# Patient Record
Sex: Male | Born: 1955 | Race: Asian | Hispanic: No | Marital: Married | Smoking: Former smoker
Health system: Southern US, Community
[De-identification: ages and names within clinical notes are randomized; demographics above are authoritative.]

## PROBLEM LIST (undated history)

## (undated) DIAGNOSIS — E785 Hyperlipidemia, unspecified: Secondary | ICD-10-CM

## (undated) DIAGNOSIS — D496 Neoplasm of unspecified behavior of brain: Secondary | ICD-10-CM

## (undated) DIAGNOSIS — R413 Other amnesia: Secondary | ICD-10-CM

## (undated) DIAGNOSIS — F29 Unspecified psychosis not due to a substance or known physiological condition: Secondary | ICD-10-CM

## (undated) DIAGNOSIS — IMO0002 Reserved for concepts with insufficient information to code with codable children: Secondary | ICD-10-CM

## (undated) DIAGNOSIS — I255 Ischemic cardiomyopathy: Secondary | ICD-10-CM

## (undated) DIAGNOSIS — R21 Rash and other nonspecific skin eruption: Secondary | ICD-10-CM

## (undated) DIAGNOSIS — C801 Malignant (primary) neoplasm, unspecified: Secondary | ICD-10-CM

## (undated) DIAGNOSIS — F17200 Nicotine dependence, unspecified, uncomplicated: Secondary | ICD-10-CM

## (undated) DIAGNOSIS — I2109 ST elevation (STEMI) myocardial infarction involving other coronary artery of anterior wall: Secondary | ICD-10-CM

## (undated) DIAGNOSIS — E559 Vitamin D deficiency, unspecified: Secondary | ICD-10-CM

## (undated) DIAGNOSIS — I2510 Atherosclerotic heart disease of native coronary artery without angina pectoris: Secondary | ICD-10-CM

## (undated) HISTORY — DX: Malignant (primary) neoplasm, unspecified: C80.1

## (undated) HISTORY — DX: Nicotine dependence, unspecified, uncomplicated: F17.200

## (undated) HISTORY — DX: Ischemic cardiomyopathy: I25.5

## (undated) HISTORY — DX: Reserved for concepts with insufficient information to code with codable children: IMO0002

## (undated) HISTORY — DX: Vitamin D deficiency, unspecified: E55.9

## (undated) HISTORY — DX: Rash and other nonspecific skin eruption: R21

## (undated) HISTORY — DX: Unspecified psychosis not due to a substance or known physiological condition: F29

## (undated) HISTORY — DX: Other amnesia: R41.3

## (undated) HISTORY — DX: ST elevation (STEMI) myocardial infarction involving other coronary artery of anterior wall: I21.09

## (undated) HISTORY — DX: Neoplasm of unspecified behavior of brain: D49.6

---

## 2007-10-02 DIAGNOSIS — C801 Malignant (primary) neoplasm, unspecified: Secondary | ICD-10-CM

## 2007-10-02 HISTORY — PX: CRANIECTOMY / CRANIOTOMY FOR EXCISION OF BRAIN TUMOR: SUR320

## 2007-10-02 HISTORY — DX: Malignant (primary) neoplasm, unspecified: C80.1

## 2008-04-26 ENCOUNTER — Encounter: Payer: Self-pay | Admitting: Internal Medicine

## 2008-06-01 ENCOUNTER — Ambulatory Visit: Payer: Self-pay | Admitting: Internal Medicine

## 2008-06-01 DIAGNOSIS — Z9181 History of falling: Secondary | ICD-10-CM | POA: Insufficient documentation

## 2008-06-01 DIAGNOSIS — R5383 Other fatigue: Secondary | ICD-10-CM

## 2008-06-01 DIAGNOSIS — R5381 Other malaise: Secondary | ICD-10-CM | POA: Insufficient documentation

## 2008-06-03 LAB — CONVERTED CEMR LAB
Albumin: 4.3 g/dL (ref 3.5–5.2)
Alkaline Phosphatase: 79 units/L (ref 39–117)
Basophils Absolute: 0 10*3/uL (ref 0.0–0.1)
HCT: 44.6 % (ref 39.0–52.0)
Hemoglobin, Urine: NEGATIVE
Hemoglobin: 15.6 g/dL (ref 13.0–17.0)
Ketones, ur: NEGATIVE mg/dL
MCV: 88.1 fL (ref 78.0–100.0)
Nitrite: NEGATIVE
RBC: 5.07 M/uL (ref 4.22–5.81)
Specific Gravity, Urine: <=1.005 (ref 1.000–1.03)
Total CK: 109 units/L (ref 7–195)
Total Protein, Urine: NEGATIVE mg/dL
Urine Glucose: NEGATIVE mg/dL
Urobilinogen, UA: 0.2 (ref 0.0–1.0)
WBC: 5.9 10*3/uL (ref 4.5–10.5)

## 2008-06-04 ENCOUNTER — Encounter: Admission: RE | Admit: 2008-06-04 | Discharge: 2008-06-04 | Payer: Self-pay | Admitting: Internal Medicine

## 2008-06-09 ENCOUNTER — Encounter: Payer: Self-pay | Admitting: Internal Medicine

## 2008-07-02 ENCOUNTER — Ambulatory Visit: Payer: Self-pay | Admitting: Internal Medicine

## 2008-07-02 DIAGNOSIS — IMO0002 Reserved for concepts with insufficient information to code with codable children: Secondary | ICD-10-CM

## 2008-07-02 DIAGNOSIS — E559 Vitamin D deficiency, unspecified: Secondary | ICD-10-CM

## 2008-07-02 HISTORY — DX: Reserved for concepts with insufficient information to code with codable children: IMO0002

## 2008-07-02 HISTORY — DX: Vitamin D deficiency, unspecified: E55.9

## 2008-07-05 ENCOUNTER — Ambulatory Visit: Payer: Self-pay | Admitting: Internal Medicine

## 2008-07-05 DIAGNOSIS — R413 Other amnesia: Secondary | ICD-10-CM

## 2008-07-05 DIAGNOSIS — F29 Unspecified psychosis not due to a substance or known physiological condition: Secondary | ICD-10-CM | POA: Insufficient documentation

## 2008-07-05 HISTORY — DX: Other amnesia: R41.3

## 2008-07-05 HISTORY — DX: Unspecified psychosis not due to a substance or known physiological condition: F29

## 2008-07-07 ENCOUNTER — Encounter: Payer: Self-pay | Admitting: Internal Medicine

## 2008-07-07 ENCOUNTER — Emergency Department (HOSPITAL_COMMUNITY): Admission: EM | Admit: 2008-07-07 | Discharge: 2008-07-07 | Payer: Self-pay | Admitting: Emergency Medicine

## 2008-07-08 ENCOUNTER — Telehealth: Payer: Self-pay | Admitting: Internal Medicine

## 2008-07-16 ENCOUNTER — Encounter: Payer: Self-pay | Admitting: Internal Medicine

## 2008-08-16 ENCOUNTER — Telehealth: Payer: Self-pay | Admitting: Internal Medicine

## 2008-08-18 ENCOUNTER — Ambulatory Visit: Payer: Self-pay | Admitting: Internal Medicine

## 2008-08-18 DIAGNOSIS — R509 Fever, unspecified: Secondary | ICD-10-CM | POA: Insufficient documentation

## 2008-08-18 DIAGNOSIS — R059 Cough, unspecified: Secondary | ICD-10-CM | POA: Insufficient documentation

## 2008-08-18 DIAGNOSIS — R21 Rash and other nonspecific skin eruption: Secondary | ICD-10-CM | POA: Insufficient documentation

## 2008-08-18 DIAGNOSIS — D496 Neoplasm of unspecified behavior of brain: Secondary | ICD-10-CM

## 2008-08-18 DIAGNOSIS — R05 Cough: Secondary | ICD-10-CM

## 2008-08-18 HISTORY — DX: Rash and other nonspecific skin eruption: R21

## 2008-08-18 HISTORY — DX: Neoplasm of unspecified behavior of brain: D49.6

## 2008-08-18 LAB — CONVERTED CEMR LAB
ALT: 82 units/L — ABNORMAL HIGH (ref 0–53)
BUN: 11 mg/dL (ref 6–23)
Bilirubin Urine: NEGATIVE
CO2: 27 meq/L (ref 19–32)
Chloride: 105 meq/L (ref 96–112)
Eosinophils Relative: 2.2 % (ref 0.0–5.0)
GFR calc non Af Amer: 127 mL/min
Hemoglobin, Urine: NEGATIVE
Leukocytes, UA: NEGATIVE
Lymphocytes Relative: 11.4 % — ABNORMAL LOW (ref 12.0–46.0)
MCHC: 34.7 g/dL (ref 30.0–36.0)
MCV: 88.9 fL (ref 78.0–100.0)
Monocytes Absolute: 0.7 10*3/uL (ref 0.1–1.0)
Neutro Abs: 11.6 10*3/uL — ABNORMAL HIGH (ref 1.4–7.7)
Neutrophils Relative %: 81.1 % — ABNORMAL HIGH (ref 43.0–77.0)
Potassium: 4.5 meq/L (ref 3.5–5.1)
RBC: 3.25 M/uL — ABNORMAL LOW (ref 4.22–5.81)
RDW: 13.3 % (ref 11.5–14.6)
Total Protein, Urine: NEGATIVE mg/dL
Urine Glucose: NEGATIVE mg/dL
WBC: 14.3 10*3/uL — ABNORMAL HIGH (ref 4.5–10.5)
pH: 5.5 (ref 5.0–8.0)

## 2008-08-20 ENCOUNTER — Telehealth: Payer: Self-pay | Admitting: Internal Medicine

## 2008-08-20 ENCOUNTER — Ambulatory Visit: Payer: Self-pay | Admitting: Internal Medicine

## 2008-08-25 ENCOUNTER — Ambulatory Visit: Payer: Self-pay | Admitting: Internal Medicine

## 2009-11-22 ENCOUNTER — Ambulatory Visit: Payer: Self-pay | Admitting: Internal Medicine

## 2009-11-22 LAB — CONVERTED CEMR LAB
Albumin: 4.3 g/dL (ref 3.5–5.2)
Bilirubin Urine: NEGATIVE
Bilirubin, Direct: 0.1 mg/dL (ref 0.0–0.3)
Cholesterol: 204 mg/dL — ABNORMAL HIGH (ref 0–200)
Creatinine, Ser: 1 mg/dL (ref 0.4–1.5)
Eosinophils Relative: 4.1 % (ref 0.0–5.0)
GFR calc non Af Amer: 83.63 mL/min (ref >60)
HCT: 43.2 % (ref 39.0–52.0)
Hemoglobin, Urine: NEGATIVE
Leukocytes, UA: NEGATIVE
MCHC: 33.7 g/dL (ref 30.0–36.0)
MCV: 88.4 fL (ref 78.0–100.0)
Monocytes Relative: 5.5 % (ref 3.0–12.0)
Neutro Abs: 3.7 10*3/uL (ref 1.4–7.7)
PSA: 0.85 ng/mL (ref 0.10–4.00)
Platelets: 259 10*3/uL (ref 150.0–400.0)
Potassium: 3.9 meq/L (ref 3.5–5.1)
RBC: 4.88 M/uL (ref 4.22–5.81)
RDW: 12.8 % (ref 11.5–14.6)
Specific Gravity, Urine: 1.015 (ref 1.000–1.030)
TSH: 0.79 microintl units/mL (ref 0.35–5.50)
Total CHOL/HDL Ratio: 4
Total Protein, Urine: NEGATIVE mg/dL
Total Protein: 6.8 g/dL (ref 6.0–8.3)
WBC: 6 10*3/uL (ref 4.5–10.5)

## 2009-11-28 ENCOUNTER — Ambulatory Visit: Payer: Self-pay | Admitting: Internal Medicine

## 2009-11-28 DIAGNOSIS — Z87891 Personal history of nicotine dependence: Secondary | ICD-10-CM | POA: Insufficient documentation

## 2009-11-28 DIAGNOSIS — F17200 Nicotine dependence, unspecified, uncomplicated: Secondary | ICD-10-CM

## 2009-11-28 HISTORY — DX: Nicotine dependence, unspecified, uncomplicated: F17.200

## 2010-10-23 ENCOUNTER — Encounter: Payer: Self-pay | Admitting: Internal Medicine

## 2010-10-31 NOTE — Assessment & Plan Note (Signed)
Summary: CPX /NWS  #   Vital Signs:  Patient profile:   55 year old male Height:      67 inches Weight:      187 pounds BMI:     29.39 Temp:     97.7 degrees F oral Pulse rate:   79 / minute BP sitting:   116 / 84  (left arm)  Vitals Entered By: Tora Perches (November 28, 2009 10:22 AM) CC: cpx Is Patient Diabetic? No   CC:  cpx.  History of Present Illness: The patient presents for a wellness examination   Current Medications (verified): 1)  Vitamin D3 1000 Unit  Tabs (Cholecalciferol) .... 2 By Mouth Daily 2)  Vitamin B Complex  Tabs (B Complex Vitamins) .Marland Kitchen.. 1 A Day 3)  Mupirocin 2 % Oint (Mupirocin) .... Use Qid 4)  Cefuroxime Axetil 500 Mg  Tabs (Cefuroxime Axetil) .Marland Kitchen.. 1 By Mouth 2 Times Daily 5)  Tamiflu 75 Mg Caps (Oseltamivir Phosphate) .... Take One Capsule By Mouth Twice A Day 6)  Promethazine Hcl 25 Mg Supp (Promethazine Hcl) .Marland Kitchen.. 1 Pr Q 4 H As Needed Nausea 7)  Kapidex 60 Mg Cpdr (Dexlansoprazole) .Marland Kitchen.. 1 By Mouth Bid 8)  Loratadine 10 Mg  Tabs (Loratadine) .... 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  The patient denies anorexia, fever, weight loss, vision loss, decreased hearing, hoarseness, chest pain, syncope, dyspnea on exertion, peripheral edema, prolonged cough, headaches, hemoptysis, abdominal pain, melena, hematochezia, severe indigestion/heartburn, hematuria, incontinence, genital sores, muscle weakness, suspicious skin lesions, transient blindness, difficulty walking, depression, unusual weight change, abnormal  bleeding, enlarged lymph nodes, angioedema, and testicular masses.         stiff neck  Physical Exam  General:  NAD Head:  Normocephalic and atraumatic without obvious abnormalities. No apparent alopecia or balding. Eyes:  No corneal or conjunctival inflammation noted. EOMI. Perrla. . Ears:  External ear exam shows no significant lesions or deformities.  Otoscopic examination reveals clear canals, tympanic membranes are intact bilaterally without bulging, retraction, inflammation or discharge. Hearing is grossly normal bilaterally. Nose:  External nasal examination shows no deformity or inflammation. Nasal mucosa are pink and moist without lesions or exudates. Mouth:  Oral mucosa and oropharynx without lesions or exudates.  Teeth in good repair. Neck:  decr ROM Chest Wall:  No deformities, masses, tenderness or gynecomastia noted. Breasts:  No masses or gynecomastia noted Lungs:  Normal respiratory effort, chest expands symmetrically. Lungs are clear to auscultation, no crackles or wheezes. Heart:  Normal rate and regular rhythm. S1 and S2 normal without gallop, murmur, click, rub or other extra sounds. Abdomen:  Bowel sounds positive,abdomen soft and non-tender without masses, organomegaly or hernias noted. Rectal:  No external abnormalities noted. Normal sphincter tone. No rectal masses or tenderness. Genitalia:  Testes bilaterally descended without nodularity, tenderness or masses. No scrotal masses or lesions. No penis lesions or urethral discharge. Prostate:  Prostate gland firm and smooth, no enlargement, nodularity, tenderness, mass, asymmetry or induration. Msk:  No deformity or scoliosis noted of thoracic or lumbar spine.  Lumbar-sacral spine is  with decr.  the ROM  Pulses:  R and L carotid,radial,femoral,dorsalis pedis and posterior tibial pulses are full and equal bilaterally Extremities:  No clubbing, cyanosis, edema, or deformity noted with normal full range of motion of all  joints.   Neurologic:  alert & oriented X3 and nl gait.   Skin:  No rash Cervical Nodes:  No lymphadenopathy noted Psych:  Oriented X3.     Impression & Recommendations:  Problem # 1:  PHYSICAL EXAMINATION (ICD-V70.0) Health and age related issues were discussed. Available screening tests and vaccinations were discussed as well. Healthy life style including good diet and execise was discussed. The labs were reviewed with the patient.  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 2 by mouth daily 2)  Vitamin B Complex Tabs (B complex vitamins) .Marland Kitchen.. 1 a day 3)  Loratadine 10 Mg Tabs (Loratadine) .... Once daily as needed allergies  Contraindications/Deferment of Procedures/Staging:    Treatment: Flu Shot    Contraindication: N/A     Test/Procedure: Colonoscopy    Reason for deferment: patient declined     Test/Procedure: DPT vaccine    Reason for deferment: declined   Patient Instructions: 1)  Please schedule a follow-up appointment in 1 year. 2)  Try to eat more raw plant food, fresh and dry fruit, raw almonds, leafy vegetables, whole foods and less red meat, less animal fat. Poultry and fish is better for you than pork and beef. Avoid processed foods (canned soups, hot dogs, sausage, bacon , frozen dinners). Avoid corn syrup, high fructose syrup or aspartam and Splenda  containing drinks. Honey, Agave and Stevia are better sweeteners. Make your own  dressing with olive oil, wine vinegar, lemon juce, garlic etc. for your salads. 3)  Use stretching exercises that I have provided (15 min. or longer every day)

## 2011-02-13 NOTE — Consult Note (Signed)
NAMESHENOUDA, GENOVA               ACCOUNT NO.:  [id number]   MEDICAL RECORD NO.:  [id number]          PATIENT TYPE:  EMS   LOCATION:  [facility]                         FACILITY:  [facility]   PHYSICIAN:  Cristi Loron, M.D.DATE OF BIRTH:  [birth year]   DATE OF CONSULTATION:  07/07/2008  DATE OF DISCHARGE:                                 CONSULTATION   CHIEF COMPLAINT:  Limp.   HISTORY OF PRESENT ILLNESS:  The patient is a 55 year old Asian male  immigrant from [place] who noted that he had been having a bit of a limp  in his right leg over about the last year.  He has seen his primary  doctor, Dr. Posey Rea who worked him up with lumbar MRI, treated him  with a prednisone taper.  The limp persisted and he was then sent for a  brain and cervical MRI today at [facility].  When the brain  MRI demonstrated a posterior fossa meningioma and some hydrocephalus, he  was instructed to go to the emergency department evidently by the  radiologist.  He was evaluated by Dr. Bruce Donath, and a neurosurgical  consultation was requested.   Presently, the patient denies headaches, nausea, vomiting, or seizures.  He says he has a bit of a limp in his right leg.  It seems to worsen in  the afternoon and improves by the morning.  He has not noticed that this  limp has changed over the last several months, i.e., his symptoms were  stable.   PAST MEDICAL HISTORY:  Negative.   PAST SURGICAL HISTORY:  None.   MEDICATIONS PRIOR TO ADMISSION:  None.   FAMILY MEDICAL HISTORY:  The patient's mother is aged 75 and good health  for age.  His father is aged 60 and has Alzheimer disease.   SOCIAL HISTORY:  The patient is an immigrant from [place].  He is  married.  He has 1 child.  He lives in [place].  He is an Art gallery manager  for [employer].  He smokes one pack per day of cigarettes.  Denies  ethanol or drug use.   REVIEW OF SYSTEMS:  Negative except as above.   PHYSICAL EXAMINATION:  GENERAL:  A  pleasant healthy-appearing 51-year-  old Asian male, in no apparent distress.  HEENT:  Normocephalic and atraumatic.  His pupils are equal, round, and  reactive to light.  Extraocular muscles intact.  Oropharynx benign.  Uvula midline.  NECK:  Supple.  There are no masses, deformities, or tracheal deviation.  He has a normal cervical range of motion.  Spurling testing is negative.  Lhermitte sign was not present.  THORAX:  Symmetric.  ABDOMEN:  Soft.  EXTREMITIES:  No obvious deformities.  BACK:  Normal.  NEUROLOGIC:  The patient is alert and oriented x3.  Cranial nerves II  through XII were examined bilaterally and grossly normal.  Vision and  hearing grossly normal bilaterally.  Motor strength is 5/5 in bilateral  deltoid, biceps, triceps, handgrip, psoas, quadriceps, gastrocnemius,  and extensor longus.  His deep tendon reflexes are 2/4 in his biceps,  triceps, and brachioradialis, 2+/5 in  bilateral quadriceps and  gastrocnemius.  He has 3B nonsustained ankle clonus bilaterally.  Sensory function is intact to light touch and sensation in all tested  dermatomes bilaterally.  Cerebellar function is intact to rapid  alternating movements.  There is minimal dysmetria to finger-to-nose.   IMAGING STUDIES:  I have reviewed the patient's cervical MRI performed  at [facility] today without contrast.  It demonstrates some  minimal degenerative changes, but no significant neural compression.   Also, the patient's brain MRI performed at [facility] today  with and without contrast.  It demonstrates he has a large posterior  fossa enhancing extra-axial lesion, which is based in the tentorium  consistent with a meningioma.  There is some mass effect upon the fourth  ventricle and brain stem area.  The patient does appear to have some  obstructive hydrocephalus.   ASSESSMENT AND PLAN:  Posterior fossa brain tumor and hydrocephalus.  I  discussed the situation with the  patient.  His symptoms are clinically  stable.  He is not having headaches, nausea, vomiting, seizures, or  worsening of his presenting symptoms.  I gave him my card and instructed  him to call my office today and scheduled appointment for me to see him  on Friday, i.e., July 09, 2008.  I have asked him to bring his wife  with him, so we can review the films, and I can discuss surgery with  him.  I have instructed him to come back to the ER should he develop  worsening symptoms such as mental status changes, nausea, vomiting, etc.  I do not think the patient needs to be on steroids as he does not have  significant edema.  I have answered all his questions.      Cristi Loron, M.D.  Electronically Signed     JDJ/MEDQ  D:  07/07/2008  T:  07/08/2008  Job:  [id number]   cc:   Georgina Quint. Plotnikov, MD

## 2011-07-03 LAB — PROTIME-INR
INR: 0.9
Prothrombin Time: 12.2

## 2011-07-03 LAB — CBC
Hemoglobin: 15.1
MCHC: 33.7
MCV: 89
RBC: 5.02
RDW: 13.9

## 2011-07-03 LAB — DIFFERENTIAL
Lymphocytes Relative: 21
Lymphs Abs: 1.4
Monocytes Relative: 4
Neutro Abs: 5.1
Neutrophils Relative %: 73

## 2011-07-03 LAB — COMPREHENSIVE METABOLIC PANEL
ALT: 30
CO2: 27
Calcium: 9.6
Creatinine, Ser: 0.95
GFR calc non Af Amer: >60
Glucose, Bld: 102 — ABNORMAL HIGH
Sodium: 139
Total Protein: 7.3

## 2011-07-03 LAB — APTT: aPTT: 30

## 2012-04-29 ENCOUNTER — Encounter: Payer: Self-pay | Admitting: Internal Medicine

## 2012-05-13 ENCOUNTER — Ambulatory Visit (INDEPENDENT_AMBULATORY_CARE_PROVIDER_SITE_OTHER)
Admission: RE | Admit: 2012-05-13 | Discharge: 2012-05-13 | Disposition: A | Discharge status: Home / Self Care | Payer: 59 | Source: Ambulatory Visit | Attending: Internal Medicine | Admitting: Internal Medicine

## 2012-05-13 ENCOUNTER — Ambulatory Visit (INDEPENDENT_AMBULATORY_CARE_PROVIDER_SITE_OTHER): Payer: 59 | Admitting: Internal Medicine

## 2012-05-13 ENCOUNTER — Encounter: Payer: Self-pay | Admitting: Internal Medicine

## 2012-05-13 VITALS — BP 118/72 | HR 81 | Temp 98.0°F | Ht 67.0 in | Wt 177.0 lb

## 2012-05-13 DIAGNOSIS — M25521 Pain in right elbow: Secondary | ICD-10-CM

## 2012-05-13 DIAGNOSIS — D496 Neoplasm of unspecified behavior of brain: Secondary | ICD-10-CM

## 2012-05-13 DIAGNOSIS — F17200 Nicotine dependence, unspecified, uncomplicated: Secondary | ICD-10-CM

## 2012-05-13 DIAGNOSIS — Z23 Encounter for immunization: Secondary | ICD-10-CM

## 2012-05-13 DIAGNOSIS — M25529 Pain in unspecified elbow: Secondary | ICD-10-CM

## 2012-05-13 DIAGNOSIS — E559 Vitamin D deficiency, unspecified: Secondary | ICD-10-CM

## 2012-05-13 DIAGNOSIS — Z0000 Encounter for general adult medical examination without abnormal findings: Secondary | ICD-10-CM | POA: Insufficient documentation

## 2012-05-13 DIAGNOSIS — R21 Rash and other nonspecific skin eruption: Secondary | ICD-10-CM | POA: Insufficient documentation

## 2012-05-13 MED ORDER — VITAMIN D 1000 UNITS PO TABS: 1000.0000 [IU] | ORAL_TABLET | Freq: Every day | ORAL | Status: AC

## 2012-05-13 MED ORDER — DOXYCYCLINE HYCLATE 100 MG PO TABS: 100.0000 mg | ORAL_TABLET | Freq: Two times a day (BID) | ORAL | Status: AC

## 2012-05-13 MED ORDER — METHYLPREDNISOLONE ACETATE 80 MG/ML IJ SUSP
120.0000 mg | Freq: Once | INTRAMUSCULAR | Status: AC
  Administered 2012-05-13: 120 mg via INTRAMUSCULAR

## 2012-05-13 NOTE — Progress Notes (Signed)
Subjective:    Patient ID: Blake [name], male    DOB: Jan 08, 1956, 56 y.o.   MRN: [id number]  HPI  The patient is here for a wellness exam. The patient has been doing well overall without major physical or psychological issues going on lately, except for -  C/o R elbow not flexing all the way  C/o skin rash x 1 mo -- itching  F/u on brain tumor    Wt Readings from Last 3 Encounters:  05/13/12 177 lb (80.287 kg)  11/28/09 187 lb (84.823 kg)  08/25/08 171 lb (77.565 kg)   BP Readings from Last 3 Encounters:  05/13/12 118/72  11/28/09 116/84  08/25/08 124/74      Review of Systems  Constitutional: Negative for appetite change, fatigue and unexpected weight change.  HENT: Negative for nosebleeds, congestion, sore throat, sneezing, trouble swallowing and neck pain.   Eyes: Negative for itching and visual disturbance.  Respiratory: Negative for cough.   Cardiovascular: Negative for chest pain, palpitations and leg swelling.  Gastrointestinal: Negative for nausea, diarrhea, blood in stool and abdominal distention.  Genitourinary: Negative for frequency and hematuria.  Musculoskeletal: Negative for back pain, joint swelling and gait problem.  Skin: Positive for rash.  Neurological: Negative for dizziness, tremors, speech difficulty and weakness.  Psychiatric/Behavioral: Negative for disturbed wake/sleep cycle, dysphoric mood and agitation. The patient is not nervous/anxious.        Objective:   Physical Exam  Constitutional: He is oriented to person, place, and time. He appears well-developed and well-nourished. No distress.  HENT:  Head: Normocephalic and atraumatic.  Right Ear: External ear normal.  Left Ear: External ear normal.  Nose: Nose normal.  Mouth/Throat: Oropharynx is clear and moist. No oropharyngeal exudate.  Eyes: Conjunctivae and EOM are normal. Pupils are equal, round, and reactive to light. Right eye exhibits no discharge. Left eye exhibits no discharge.  No scleral icterus.  Neck: Normal range of motion. Neck supple. No JVD present. No tracheal deviation present. No thyromegaly present.  Cardiovascular: Normal rate, regular rhythm, normal heart sounds and intact distal pulses.  Exam reveals no gallop and no friction rub.   No murmur heard. Pulmonary/Chest: Effort normal and breath sounds normal. No stridor. No respiratory distress. He has no wheezes. He has no rales. He exhibits no tenderness.  Abdominal: Soft. Bowel sounds are normal. He exhibits no distension and no mass. There is no tenderness. There is no rebound and no guarding.  Genitourinary: Rectum normal, prostate normal and penis normal. Guaiac negative stool. No penile tenderness.  Musculoskeletal: Normal range of motion. He exhibits no edema and no tenderness.  Lymphadenopathy:    He has no cervical adenopathy.  Neurological: He is alert and oriented to person, place, and time. He has normal reflexes. No cranial nerve deficit. He exhibits normal muscle tone. Coordination normal.  Skin: Skin is warm and dry. Rash noted. He is not diaphoretic. No erythema. No pallor.       Papular rash  Psychiatric: He has a normal mood and affect. His behavior is normal. Judgment and thought content normal.  R elbow is NT w/restricted flexion  Lab Results  Component Value Date   WBC 6.0 11/22/2009   HGB 14.5 11/22/2009   HCT 43.2 11/22/2009   PLT 259.0 11/22/2009   GLUCOSE 104* 11/22/2009   CHOL 204* 11/22/2009   TRIG 113.0 11/22/2009   HDL 46.00 11/22/2009   LDLDIRECT 153.1 11/22/2009   ALT 30 11/22/2009   AST 26 11/22/2009  NA 142 11/22/2009   K 3.9 11/22/2009   CL 108 11/22/2009   CREATININE 1.0 11/22/2009   BUN 11 11/22/2009   CO2 24 11/22/2009   TSH 0.79 11/22/2009   PSA 0.85 11/22/2009   INR 0.9 07/07/2008          Assessment & Plan:

## 2012-05-13 NOTE — Assessment & Plan Note (Signed)
Xray  May need NSAIDs

## 2012-05-13 NOTE — Assessment & Plan Note (Addendum)
We discussed age appropriate health related issues, including available/recomended screening tests and vaccinations. We discussed a need for adhering to healthy diet and exercise. Labs/EKG were reviewed/ordered. All questions were answered.  Declined colonoscopy Opth q 24 mo tDAP Zostavax adviced

## 2012-05-13 NOTE — Assessment & Plan Note (Signed)
New 05/2012 - strep(?) Doxy x 3 wks Depomedrol 120 mg IM

## 2012-05-13 NOTE — Assessment & Plan Note (Signed)
Restart vit D

## 2012-05-13 NOTE — Assessment & Plan Note (Signed)
2009 craniotomy, XRT

## 2012-05-13 NOTE — Assessment & Plan Note (Signed)
Discussed.

## 2012-05-14 ENCOUNTER — Telehealth: Payer: Self-pay | Admitting: Internal Medicine

## 2012-05-14 ENCOUNTER — Other Ambulatory Visit (INDEPENDENT_AMBULATORY_CARE_PROVIDER_SITE_OTHER): Payer: 59

## 2012-05-14 DIAGNOSIS — F17200 Nicotine dependence, unspecified, uncomplicated: Secondary | ICD-10-CM

## 2012-05-14 DIAGNOSIS — D496 Neoplasm of unspecified behavior of brain: Secondary | ICD-10-CM

## 2012-05-14 DIAGNOSIS — M25521 Pain in right elbow: Secondary | ICD-10-CM

## 2012-05-14 DIAGNOSIS — Z0000 Encounter for general adult medical examination without abnormal findings: Secondary | ICD-10-CM

## 2012-05-14 DIAGNOSIS — M25529 Pain in unspecified elbow: Secondary | ICD-10-CM

## 2012-05-14 DIAGNOSIS — R21 Rash and other nonspecific skin eruption: Secondary | ICD-10-CM

## 2012-05-14 LAB — URINALYSIS
Bilirubin Urine: NEGATIVE
Hgb urine dipstick: NEGATIVE
Ketones, ur: NEGATIVE
Leukocytes, UA: NEGATIVE
Specific Gravity, Urine: <=1.005 (ref 1.000–1.030)
Urobilinogen, UA: 0.2 (ref 0.0–1.0)

## 2012-05-14 LAB — BASIC METABOLIC PANEL
Calcium: 9.5 mg/dL (ref 8.4–10.5)
Creatinine, Ser: 0.8 mg/dL (ref 0.4–1.5)
GFR: 107.16 mL/min (ref >60.00)

## 2012-05-14 LAB — TSH: TSH: 0.81 u[IU]/mL (ref 0.35–5.50)

## 2012-05-14 LAB — CBC WITH DIFFERENTIAL/PLATELET
Basophils Relative: 0.3 % (ref 0.0–3.0)
Eosinophils Absolute: 0.3 10*3/uL (ref 0.0–0.7)
Eosinophils Relative: 3.9 % (ref 0.0–5.0)
Lymphocytes Relative: 23.9 % (ref 12.0–46.0)
Neutrophils Relative %: 66.4 % (ref 43.0–77.0)
RBC: 5.32 Mil/uL (ref 4.22–5.81)
WBC: 7.7 10*3/uL (ref 4.5–10.5)

## 2012-05-14 LAB — HEPATIC FUNCTION PANEL
ALT: 19 U/L (ref 0–53)
Total Bilirubin: 0.6 mg/dL (ref 0.3–1.2)

## 2012-05-14 LAB — LIPID PANEL
HDL: 48 mg/dL (ref >39.00)
Triglycerides: 81 mg/dL (ref 0.0–149.0)

## 2012-05-14 LAB — PSA: PSA: 0.75 ng/mL (ref 0.10–4.00)

## 2012-05-14 NOTE — Telephone Encounter (Signed)
Blake [name], please, inform patient that his elbow xray was ok Thx

## 2012-05-15 ENCOUNTER — Encounter: Payer: Self-pay | Admitting: Internal Medicine

## 2012-05-15 ENCOUNTER — Telehealth: Payer: Self-pay | Admitting: Internal Medicine

## 2012-05-15 NOTE — Telephone Encounter (Signed)
Called patient per MD/ LM with other

## 2012-05-15 NOTE — Telephone Encounter (Signed)
Blake [name], please, inform patient that all labs are normal except for elev chol Improve diet  Please, mail the labs to the patient.     Thx

## 2012-05-16 NOTE — Telephone Encounter (Signed)
Pt informed of lab results. 

## 2012-05-16 NOTE — Telephone Encounter (Signed)
Left message for pt to callback office (lab results also mailed to pt).

## 2012-06-09 ENCOUNTER — Telehealth: Payer: Self-pay | Admitting: Internal Medicine

## 2012-06-09 NOTE — Telephone Encounter (Signed)
Caller: [facility]/Patient; Patient Name: Blake [name]; PCP: Plotnikov, Alex (Adults only); Best Callback Phone Number: [phone] Seen in office on 05/13/12 and put on antibiotic for itchy pimple-like  bumps on arms, chest and buttocks - onset ~ 2 months ago. Some of the bumps have had a small amount of clear driainage. Starts out as bump, then becomes red and starts to itch. Afebrile. Triage and Care advice per Skin Lesions Protocol and appointment advised within 24 hours for "new signs and symptoms of local infection". Appointment scheduled at 0915 on 06/10/12 with Dr. Everardo All.

## 2012-06-09 NOTE — Telephone Encounter (Signed)
Spoke to pt- he states he is seeing Dr. Everardo All tomorrow.

## 2012-06-09 NOTE — Telephone Encounter (Signed)
The pt called and is stating he still has a skin rash.  He originally discussed this problem during his cpe on 8/13.  He is hoping to find out if he needs a referral to a dermatologist or if he needs to come in for a follow up office visit?   Thanks!

## 2012-06-10 ENCOUNTER — Ambulatory Visit (INDEPENDENT_AMBULATORY_CARE_PROVIDER_SITE_OTHER): Payer: 59 | Admitting: Endocrinology

## 2012-06-10 ENCOUNTER — Encounter: Payer: Self-pay | Admitting: Endocrinology

## 2012-06-10 VITALS — BP 116/82 | HR 73 | Temp 97.7°F

## 2012-06-10 DIAGNOSIS — R21 Rash and other nonspecific skin eruption: Secondary | ICD-10-CM

## 2012-06-10 MED ORDER — HALOBETASOL PROPIONATE 0.05 % EX CREA: TOPICAL_CREAM | Freq: Three times a day (TID) | CUTANEOUS | Status: AC

## 2012-06-10 NOTE — Progress Notes (Signed)
  Subjective:    Patient ID: Blake [name], male    DOB: [birth year], 56 y.o.   MRN: [id number]  HPI Pt states few mos of mild rash throughout the body, and intermittent assoc itching.  No help with abx. He is unable to cite precip factor.   Past Medical History  Diagnosis Date  . VITAMIN D DEFICIENCY 07/02/2008    Qualifier: Diagnosis of  By: Posey Rea MD, Georgina Quint TOBACCO USER 11/28/2009    Qualifier: Diagnosis of  By: Posey Rea MD, Georgina Quint Memory loss 07/05/2008    Qualifier: Diagnosis of  By: Posey Rea MD, Georgina Quint LUMBAR RADICULOPATHY, RIGHT 07/02/2008    Qualifier: Diagnosis of  By: Posey Rea MD, Georgina Quint MENTAL CONFUSION 07/05/2008    Qualifier: Diagnosis of  By: Posey Rea MD, Georgina Quint Neoplasm of unspecified nature of brain 08/18/2008    Qualifier: Diagnosis of  By: Posey Rea MD, Georgina Quint Rash and other nonspecific skin eruption 08/18/2008    Qualifier: Diagnosis of  By: Tora Perches    . Cancer 2009    brain tumor    Past Surgical History  Procedure Date  . Craniectomy / craniotomy for excision of brain tumor 2009    surgery and XRT at [facility]    History   Social History  . Marital Status: Married    Spouse Name: N/A    Number of Children: 1  . Years of Education: N/A   Occupational History  . 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 Smoking status: Current Every Day Smoker -- 1.0 packs/day    Types: Cigarettes  . Smokeless tobacco: Not on file  . Alcohol Use: No  . Drug Use: No  . Sexually Active: Yes   Other Topics Concern  . Not on file   Social History Narrative  . No narrative on file    Current Outpatient Prescriptions on File Prior to Visit  Medication Sig Dispense Refill  . cholecalciferol (VITAMIN D) 1000 UNITS tablet Take 1 tablet (1,000 Units total) by mouth daily.  100 tablet  3    No Known Allergies  Family History  Problem Relation Age of Onset  . Alzheimer's disease Father   .  Parkinsonism Father   . Hypertension Other   . Arthritis Mother     BP 116/82  Pulse 73  Temp 97.7 F (36.5 C) (Oral)  SpO2 96%  Review of Systems Denies fever    Objective:   Physical Exam VITAL SIGNS:  See vs page GENERAL: no distress Skin: mild diffuse multipapular rash       Assessment & Plan:

## 2012-06-10 NOTE — Patient Instructions (Addendum)
i have sent a prescription to your pharmacy, for a cream against the rash. I hope you feel better soon.  If you don't feel better by next week, please call back.

## 2012-08-04 ENCOUNTER — Ambulatory Visit (INDEPENDENT_AMBULATORY_CARE_PROVIDER_SITE_OTHER): Payer: 59 | Admitting: Internal Medicine

## 2012-08-04 ENCOUNTER — Encounter: Payer: Self-pay | Admitting: Internal Medicine

## 2012-08-04 VITALS — BP 112/84 | HR 84 | Temp 97.4°F | Resp 16 | Wt 173.0 lb

## 2012-08-04 DIAGNOSIS — R21 Rash and other nonspecific skin eruption: Secondary | ICD-10-CM

## 2012-08-04 MED ORDER — PERMETHRIN 5 % EX CREA: TOPICAL_CREAM | Freq: Once | CUTANEOUS | Status: DC

## 2012-08-04 NOTE — Assessment & Plan Note (Addendum)
New 05/2012 - strep(?) - no help w/abx 11/13 - his brother was dx'd w/scabies Empiric Permethrin 5% once Derm cons

## 2012-08-04 NOTE — Progress Notes (Signed)
Patient ID: Blake [name], male   DOB: Jun 21, 1956, 56 y.o.   MRN: [id number]  Subjective:    Patient ID: Blake [name], male    DOB: 06/28/1956, 56 y.o.   MRN: [id number]  Rash Pertinent negatives include no congestion, cough, diarrhea, fatigue or sore throat.      C/o skin rash x 5 mo -- itching  His brother was dx'd w/scabies    Wt Readings from Last 3 Encounters:  08/04/12 173 lb (78.472 kg)  05/13/12 177 lb (80.287 kg)  11/28/09 187 lb (84.823 kg)   BP Readings from Last 3 Encounters:  08/04/12 112/84  06/10/12 116/82  05/13/12 118/72      Review of Systems  Constitutional: Negative for appetite change, fatigue and unexpected weight change.  HENT: Negative for nosebleeds, congestion, sore throat, sneezing, trouble swallowing and neck pain.   Eyes: Negative for itching and visual disturbance.  Respiratory: Negative for cough.   Cardiovascular: Negative for chest pain, palpitations and leg swelling.  Gastrointestinal: Negative for nausea, diarrhea, blood in stool and abdominal distention.  Genitourinary: Negative for frequency and hematuria.  Musculoskeletal: Negative for back pain, joint swelling and gait problem.  Skin: Positive for rash.  Neurological: Negative for dizziness, tremors, speech difficulty and weakness.  Psychiatric/Behavioral: Negative for sleep disturbance, dysphoric mood and agitation. The patient is not nervous/anxious.        Objective:   Physical Exam  Constitutional: He is oriented to person, place, and time. He appears well-developed and well-nourished. No distress.  HENT:  Head: Normocephalic and atraumatic.  Right Ear: External ear normal.  Left Ear: External ear normal.  Nose: Nose normal.  Mouth/Throat: Oropharynx is clear and moist. No oropharyngeal exudate.  Eyes: Conjunctivae normal and EOM are normal. Pupils are equal, round, and reactive to light. Right eye exhibits no discharge. Left eye exhibits no discharge. No scleral icterus.    Neck: Normal range of motion. Neck supple. No JVD present. No tracheal deviation present. No thyromegaly present.  Cardiovascular: Normal rate, regular rhythm, normal heart sounds and intact distal pulses.  Exam reveals no gallop and no friction rub.   No murmur heard. Pulmonary/Chest: Effort normal and breath sounds normal. No stridor. No respiratory distress. He has no wheezes. He has no rales. He exhibits no tenderness.  Abdominal: Soft. Bowel sounds are normal. He exhibits no distension and no mass. There is no tenderness. There is no rebound and no guarding.  Genitourinary: Rectum normal, prostate normal and penis normal. Guaiac negative stool. No penile tenderness.  Musculoskeletal: Normal range of motion. He exhibits no edema and no tenderness.  Lymphadenopathy:    He has no cervical adenopathy.  Neurological: He is alert and oriented to person, place, and time. He has normal reflexes. No cranial nerve deficit. He exhibits normal muscle tone. Coordination normal.  Skin: Skin is warm and dry. Rash noted. He is not diaphoretic. No erythema. No pallor.       Papular rash  Psychiatric: He has a normal mood and affect. His behavior is normal. Judgment and thought content normal.  R elbow is NT w/restricted flexion  Lab Results  Component Value Date   WBC 7.7 05/14/2012   HGB 15.4 05/14/2012   HCT 46.5 05/14/2012   PLT 315.0 05/14/2012   GLUCOSE 85 05/14/2012   CHOL 225* 05/14/2012   TRIG 81.0 05/14/2012   HDL 48.00 05/14/2012   LDLDIRECT 161.0 05/14/2012   ALT 19 05/14/2012   AST 19 05/14/2012   NA 139 05/14/2012  K 5.4* 05/14/2012   CL 104 05/14/2012   CREATININE 0.8 05/14/2012   BUN 15 05/14/2012   CO2 27 05/14/2012   TSH 0.81 05/14/2012   PSA 0.75 05/14/2012   INR 0.9 07/07/2008          Assessment & Plan:

## 2012-08-11 ENCOUNTER — Other Ambulatory Visit: Payer: Self-pay | Admitting: *Deleted

## 2012-08-11 MED ORDER — PERMETHRIN 5 % EX CREA: TOPICAL_CREAM | Freq: Once | CUTANEOUS | Status: DC

## 2012-08-11 NOTE — Telephone Encounter (Signed)
Pt requesting refill of Acticin cream. Rx sent.

## 2012-10-23 ENCOUNTER — Telehealth: Payer: Self-pay | Admitting: *Deleted

## 2012-10-23 NOTE — Telephone Encounter (Signed)
I haven't seen it yet Thx 

## 2012-10-23 NOTE — Telephone Encounter (Signed)
Pt left vm stating Dr. Jorja Loa was sending a OV note re: his recent visit and to request that we draw some labs. Have you seen this note? If not, I will have to call Tafeen's office and get the order for labs so I can enter in Epic for our lab to draw.

## 2012-10-24 ENCOUNTER — Telehealth: Payer: Self-pay | Admitting: *Deleted

## 2012-10-24 ENCOUNTER — Other Ambulatory Visit: Payer: Self-pay | Admitting: *Deleted

## 2012-10-24 DIAGNOSIS — R21 Rash and other nonspecific skin eruption: Secondary | ICD-10-CM

## 2012-10-24 NOTE — Telephone Encounter (Signed)
Dr. Posey Rea- please order the CBC with smear and HTLV- 1 serology. See my pended orders in order entry. I wasn't sure about those. Thanks!

## 2012-10-24 NOTE — Telephone Encounter (Signed)
See phone noted dated 10/25/11.

## 2012-10-25 NOTE — Telephone Encounter (Signed)
Ok to go to lab any time this wk Thx

## 2012-10-27 ENCOUNTER — Telehealth: Payer: Self-pay | Admitting: *Deleted

## 2012-10-27 NOTE — Telephone Encounter (Signed)
See previous msg pt has been notified...Blake [name]

## 2012-10-27 NOTE — Telephone Encounter (Signed)
Left msg on triage call Friday abt having some labs done. Haven't received call bck. Called pt back inform him md ok labs can come in this week to have done...Raechel Chute

## 2012-10-28 ENCOUNTER — Other Ambulatory Visit: Payer: 59

## 2012-10-28 DIAGNOSIS — R21 Rash and other nonspecific skin eruption: Secondary | ICD-10-CM

## 2012-10-31 LAB — HTLV I+II ANTIBODIES, (EIA), BLD: HTLV I/II Antibody: NONREACTIVE

## 2012-10-31 LAB — LACTATE DEHYDROGENASE, ISOENZYMES
LD1/LD2 Ratio: 0.68
LDH 4: 9 % (ref 3–12)
LDH 5: 13 % (ref 3–14)
LDH Isoenzymes, Total: 158 U/L (ref 120–250)

## 2012-11-06 ENCOUNTER — Encounter: Payer: Self-pay | Admitting: Internal Medicine

## 2012-11-06 ENCOUNTER — Ambulatory Visit (INDEPENDENT_AMBULATORY_CARE_PROVIDER_SITE_OTHER): Payer: 59 | Admitting: Internal Medicine

## 2012-11-06 VITALS — BP 112/80 | HR 84 | Temp 97.7°F | Resp 16 | Wt 178.0 lb

## 2012-11-06 DIAGNOSIS — F17200 Nicotine dependence, unspecified, uncomplicated: Secondary | ICD-10-CM

## 2012-11-06 DIAGNOSIS — R21 Rash and other nonspecific skin eruption: Secondary | ICD-10-CM

## 2012-11-06 DIAGNOSIS — E559 Vitamin D deficiency, unspecified: Secondary | ICD-10-CM

## 2012-11-06 NOTE — Progress Notes (Signed)
Subjective:    Rash This is a chronic problem. The current episode started more than 1 month ago (9-11 mo). Pertinent negatives include no congestion, cough, diarrhea, fatigue or sore throat.      C/o skin rash x 11 mo -- better  F/u Vit D def and tobacco smoking    Wt Readings from Last 3 Encounters:  11/06/12 178 lb (80.74 kg)  08/04/12 173 lb (78.472 kg)  05/13/12 177 lb (80.287 kg)   BP Readings from Last 3 Encounters:  11/06/12 112/80  08/04/12 112/84  06/10/12 116/82      Review of Systems  Constitutional: Negative for appetite change, fatigue and unexpected weight change.  HENT: Negative for nosebleeds, congestion, sore throat, sneezing, trouble swallowing and neck pain.   Eyes: Negative for itching and visual disturbance.  Respiratory: Negative for cough.   Cardiovascular: Negative for chest pain, palpitations and leg swelling.  Gastrointestinal: Negative for nausea, diarrhea, blood in stool and abdominal distention.  Genitourinary: Negative for frequency and hematuria.  Musculoskeletal: Negative for back pain, joint swelling and gait problem.  Skin: Positive for rash.  Neurological: Negative for dizziness, tremors, speech difficulty and weakness.  Psychiatric/Behavioral: Negative for sleep disturbance, dysphoric mood and agitation. The patient is not nervous/anxious.        Objective:   Physical Exam  Constitutional: He is oriented to person, place, and time. He appears well-developed and well-nourished. No distress.  HENT:  Head: Normocephalic and atraumatic.  Right Ear: External ear normal.  Left Ear: External ear normal.  Nose: Nose normal.  Mouth/Throat: Oropharynx is clear and moist. No oropharyngeal exudate.  Eyes: Conjunctivae normal and EOM are normal. Pupils are equal, round, and reactive to light. Right eye exhibits no discharge. Left eye exhibits no discharge. No scleral icterus.  Neck: Normal range of motion. Neck supple. No JVD present.  No tracheal deviation present. No thyromegaly present.  Cardiovascular: Normal rate, regular rhythm, normal heart sounds and intact distal pulses.  Exam reveals no gallop and no friction rub.   No murmur heard. Pulmonary/Chest: Effort normal and breath sounds normal. No stridor. No respiratory distress. He has no wheezes. He has no rales. He exhibits no tenderness.  Abdominal: Soft. Bowel sounds are normal. He exhibits no distension and no mass. There is no tenderness. There is no rebound and no guarding.  Genitourinary: Rectum normal, prostate normal and penis normal. Guaiac negative stool. No penile tenderness.  Musculoskeletal: Normal range of motion. He exhibits no edema and no tenderness.  Lymphadenopathy:    He has no cervical adenopathy.  Neurological: He is alert and oriented to person, place, and time. He has normal reflexes. No cranial nerve deficit. He exhibits normal muscle tone. Coordination normal.  Skin: Skin is warm and dry. Rash noted. He is not diaphoretic. No erythema. No pallor.       Papular rash  Psychiatric: He has a normal mood and affect. His behavior is normal. Judgment and thought content normal.  R elbow is NT w/restricted flexion  Lab Results  Component Value Date   WBC 7.7 05/14/2012   HGB 15.4 05/14/2012   HCT 46.5 05/14/2012   PLT 315.0 05/14/2012   GLUCOSE 85 05/14/2012   CHOL 225* 05/14/2012   TRIG 81.0 05/14/2012   HDL 48.00 05/14/2012   LDLDIRECT 161.0 05/14/2012   ALT 19 05/14/2012   AST 19 05/14/2012   NA 139 05/14/2012   K 5.4* 05/14/2012   CL 104 05/14/2012   CREATININE 0.8 05/14/2012  BUN 15 05/14/2012   CO2 27 05/14/2012   TSH 0.81 05/14/2012   PSA 0.75 05/14/2012   INR 0.9 07/07/2008          Assessment & Plan:

## 2012-11-06 NOTE — Assessment & Plan Note (Signed)
Discussed.

## 2012-11-06 NOTE — Assessment & Plan Note (Signed)
Re-start Vit D 

## 2012-11-06 NOTE — Assessment & Plan Note (Signed)
2/14 r/o pre-Tcell lymphoma Dr Jorja Loa L[facility], HTLV - in 6 mo

## 2012-11-15 ENCOUNTER — Other Ambulatory Visit: Payer: Self-pay

## 2013-08-06 ENCOUNTER — Other Ambulatory Visit: Payer: Self-pay

## 2014-02-09 ENCOUNTER — Ambulatory Visit (INDEPENDENT_AMBULATORY_CARE_PROVIDER_SITE_OTHER): Payer: 59 | Admitting: Internal Medicine

## 2014-02-09 ENCOUNTER — Encounter: Payer: Self-pay | Admitting: Internal Medicine

## 2014-02-09 VITALS — BP 120/80 | Temp 98.9°F | Ht 67.0 in | Wt 184.0 lb

## 2014-02-09 DIAGNOSIS — D496 Neoplasm of unspecified behavior of brain: Secondary | ICD-10-CM

## 2014-02-09 DIAGNOSIS — Z0000 Encounter for general adult medical examination without abnormal findings: Secondary | ICD-10-CM

## 2014-02-09 DIAGNOSIS — F17200 Nicotine dependence, unspecified, uncomplicated: Secondary | ICD-10-CM

## 2014-02-09 DIAGNOSIS — M25549 Pain in joints of unspecified hand: Secondary | ICD-10-CM

## 2014-02-09 DIAGNOSIS — E559 Vitamin D deficiency, unspecified: Secondary | ICD-10-CM

## 2014-02-09 DIAGNOSIS — D320 Benign neoplasm of cerebral meninges: Secondary | ICD-10-CM | POA: Insufficient documentation

## 2014-02-09 MED ORDER — MELOXICAM 7.5 MG PO TABS: 7.5000 mg | ORAL_TABLET | Freq: Every day | ORAL | Status: DC | PRN

## 2014-02-09 NOTE — Assessment & Plan Note (Addendum)
  We discussed age appropriate health related issues, including available/recomended screening tests and vaccinations. We discussed a need for adhering to healthy diet and exercise. Labs/EKG were reviewed/ordered. All questions were answered. Declined colonoscopy Opth q 24 mo Zostavax advised Cologuard (colonoscopy declined)

## 2014-02-09 NOTE — Assessment & Plan Note (Signed)
Re-start Vit D 

## 2014-02-09 NOTE — Progress Notes (Signed)
Subjective:     HPI  The patient is here for a wellness exam. The patient has been doing well overall without major physical or psychological issues going on lately, except for -  C/o hand pain and stiffness  F/u on brain tumor    Wt Readings from Last 3 Encounters:  02/09/14 184 lb (83.462 kg)  11/06/12 178 lb (80.74 kg)  08/04/12 173 lb (78.472 kg)   BP Readings from Last 3 Encounters:  02/09/14 120/80  11/06/12 112/80  08/04/12 112/84      Review of Systems  Constitutional: Negative for appetite change, fatigue and unexpected weight change.  HENT: Negative for congestion, nosebleeds, sneezing, sore throat and trouble swallowing.   Eyes: Negative for itching and visual disturbance.  Respiratory: Negative for cough.   Cardiovascular: Negative for chest pain, palpitations and leg swelling.  Gastrointestinal: Negative for nausea, diarrhea, blood in stool and abdominal distention.  Genitourinary: Negative for frequency and hematuria.  Musculoskeletal: Negative for back pain, gait problem, joint swelling and neck pain.  Skin: Positive for rash.  Neurological: Negative for dizziness, tremors, speech difficulty and weakness.  Psychiatric/Behavioral: Negative for sleep disturbance, dysphoric mood and agitation. The patient is not nervous/anxious.        Objective:   Physical Exam  Constitutional: He is oriented to person, place, and time. He appears well-developed and well-nourished. No distress.  HENT:  Head: Normocephalic and atraumatic.  Right Ear: External ear normal.  Left Ear: External ear normal.  Nose: Nose normal.  Mouth/Throat: Oropharynx is clear and moist. No oropharyngeal exudate.  Eyes: Conjunctivae and EOM are normal. Pupils are equal, round, and reactive to light. Right eye exhibits no discharge. Left eye exhibits no discharge. No scleral icterus.  Neck: Normal range of motion. Neck supple. No JVD present. No tracheal deviation present. No thyromegaly  present.  Cardiovascular: Normal rate, regular rhythm, normal heart sounds and intact distal pulses.  Exam reveals no gallop and no friction rub.   No murmur heard. Pulmonary/Chest: Effort normal and breath sounds normal. No stridor. No respiratory distress. He has no wheezes. He has no rales. He exhibits no tenderness.  Abdominal: Soft. Bowel sounds are normal. He exhibits no distension and no mass. There is no tenderness. There is no rebound and no guarding.  Genitourinary: Rectum normal, prostate normal and penis normal. Guaiac negative stool. No penile tenderness.  Musculoskeletal: Normal range of motion. He exhibits no edema and no tenderness.  Lymphadenopathy:    He has no cervical adenopathy.  Neurological: He is alert and oriented to person, place, and time. He has normal reflexes. No cranial nerve deficit. He exhibits normal muscle tone. Coordination normal.  Skin: Skin is warm and dry. Rash noted. He is not diaphoretic. No erythema. No pallor.  Papular rash  Psychiatric: He has a normal mood and affect. His behavior is normal. Judgment and thought content normal.  R elbow is NT w/restricted flexion  Lab Results  Component Value Date   WBC 7.7 05/14/2012   HGB 15.4 05/14/2012   HCT 46.5 05/14/2012   PLT 315.0 05/14/2012   GLUCOSE 85 05/14/2012   CHOL 225* 05/14/2012   TRIG 81.0 05/14/2012   HDL 48.00 05/14/2012   LDLDIRECT 161.0 05/14/2012   ALT 19 05/14/2012   AST 19 05/14/2012   NA 139 05/14/2012   K 5.4* 05/14/2012   CL 104 05/14/2012   CREATININE 0.8 05/14/2012   BUN 15 05/14/2012   CO2 27 05/14/2012   TSH 0.81 05/14/2012  PSA 0.75 05/14/2012   INR 0.9 07/07/2008          Assessment & Plan:

## 2014-02-09 NOTE — Assessment & Plan Note (Signed)
Labs Meloxicam prn

## 2014-02-09 NOTE — Assessment & Plan Note (Signed)
Discussed.

## 2014-02-09 NOTE — Assessment & Plan Note (Signed)
F/u at [facility] 

## 2014-02-09 NOTE — Progress Notes (Deleted)
Pre visit review using our clinic review tool, if applicable. No additional management support is needed unless otherwise documented below in the visit note. 

## 2014-02-10 ENCOUNTER — Other Ambulatory Visit (INDEPENDENT_AMBULATORY_CARE_PROVIDER_SITE_OTHER): Payer: 59

## 2014-02-10 DIAGNOSIS — E559 Vitamin D deficiency, unspecified: Secondary | ICD-10-CM

## 2014-02-10 DIAGNOSIS — M25549 Pain in joints of unspecified hand: Secondary | ICD-10-CM

## 2014-02-10 DIAGNOSIS — Z0000 Encounter for general adult medical examination without abnormal findings: Secondary | ICD-10-CM

## 2014-02-10 LAB — LIPID PANEL
CHOLESTEROL: 199 mg/dL (ref 0–200)
HDL: 36.7 mg/dL — AB (ref >39.00)
LDL CALC: 139 mg/dL — AB (ref 0–99)
TRIGLYCERIDES: 117 mg/dL (ref 0.0–149.0)
Total CHOL/HDL Ratio: 5
VLDL: 23.4 mg/dL (ref 0.0–40.0)

## 2014-02-10 LAB — CBC WITH DIFFERENTIAL/PLATELET
Basophils Absolute: 0 10*3/uL (ref 0.0–0.1)
Basophils Relative: 0.3 % (ref 0.0–3.0)
EOS PCT: 2.6 % (ref 0.0–5.0)
Eosinophils Absolute: 0.2 10*3/uL (ref 0.0–0.7)
HCT: 45.1 % (ref 39.0–52.0)
Hemoglobin: 15.4 g/dL (ref 13.0–17.0)
LYMPHS PCT: 28 % (ref 12.0–46.0)
Lymphs Abs: 1.9 10*3/uL (ref 0.7–4.0)
MCHC: 34.1 g/dL (ref 30.0–36.0)
MCV: 85.6 fl (ref 78.0–100.0)
MONO ABS: 0.3 10*3/uL (ref 0.1–1.0)
Monocytes Relative: 4.9 % (ref 3.0–12.0)
NEUTROS PCT: 64.2 % (ref 43.0–77.0)
Neutro Abs: 4.4 10*3/uL (ref 1.4–7.7)
PLATELETS: 258 10*3/uL (ref 150.0–400.0)
RBC: 5.26 Mil/uL (ref 4.22–5.81)
RDW: 13.7 % (ref 11.5–15.5)
WBC: 6.8 10*3/uL (ref 4.0–10.5)

## 2014-02-10 LAB — VITAMIN B12: VITAMIN B 12: 297 pg/mL (ref 211–911)

## 2014-02-10 LAB — BASIC METABOLIC PANEL
BUN: 14 mg/dL (ref 6–23)
CALCIUM: 9.5 mg/dL (ref 8.4–10.5)
CHLORIDE: 109 meq/L (ref 96–112)
CO2: 24 mEq/L (ref 19–32)
CREATININE: 1 mg/dL (ref 0.4–1.5)
GFR: 84.24 mL/min (ref >60.00)
Glucose, Bld: 94 mg/dL (ref 70–99)
Potassium: 4.3 mEq/L (ref 3.5–5.1)
Sodium: 142 mEq/L (ref 135–145)

## 2014-02-10 LAB — PSA: PSA: 1.03 ng/mL (ref 0.10–4.00)

## 2014-02-10 LAB — TSH: TSH: 0.69 u[IU]/mL (ref 0.35–4.50)

## 2014-02-10 LAB — HEPATIC FUNCTION PANEL
ALT: 20 U/L (ref 0–53)
AST: 24 U/L (ref 0–37)
Albumin: 4.1 g/dL (ref 3.5–5.2)
Alkaline Phosphatase: 83 U/L (ref 39–117)
BILIRUBIN DIRECT: 0.1 mg/dL (ref 0.0–0.3)
BILIRUBIN TOTAL: 0.8 mg/dL (ref 0.2–1.2)
Total Protein: 6.9 g/dL (ref 6.0–8.3)

## 2014-02-10 LAB — RHEUMATOID FACTOR

## 2014-02-10 LAB — SEDIMENTATION RATE: SED RATE: 11 mm/h (ref 0–22)

## 2014-02-11 LAB — VITAMIN D 25 HYDROXY (VIT D DEFICIENCY, FRACTURES): VIT D 25 HYDROXY: 20 ng/mL — AB (ref 30–89)

## 2014-02-12 ENCOUNTER — Other Ambulatory Visit: Payer: Self-pay | Admitting: Internal Medicine

## 2014-02-12 MED ORDER — ERGOCALCIFEROL 1.25 MG (50000 UT) PO CAPS: 50000.0000 [IU] | ORAL_CAPSULE | ORAL | Status: DC

## 2014-02-12 MED ORDER — VITAMIN B-12 1000 MCG SL SUBL: 1.0000 | SUBLINGUAL_TABLET | Freq: Every day | SUBLINGUAL | Status: DC

## 2014-03-24 ENCOUNTER — Other Ambulatory Visit: Payer: Self-pay | Admitting: Internal Medicine

## 2014-05-11 ENCOUNTER — Other Ambulatory Visit: Payer: Self-pay | Admitting: Internal Medicine

## 2014-06-16 ENCOUNTER — Other Ambulatory Visit: Payer: Self-pay | Admitting: Internal Medicine

## 2014-11-26 ENCOUNTER — Other Ambulatory Visit: Payer: Self-pay | Admitting: Internal Medicine

## 2015-01-05 ENCOUNTER — Other Ambulatory Visit: Payer: Self-pay | Admitting: Internal Medicine

## 2015-12-12 ENCOUNTER — Ambulatory Visit (INDEPENDENT_AMBULATORY_CARE_PROVIDER_SITE_OTHER)
Admission: RE | Admit: 2015-12-12 | Discharge: 2015-12-12 | Disposition: A | Discharge status: Home / Self Care | Payer: 59 | Source: Ambulatory Visit | Attending: Internal Medicine | Admitting: Internal Medicine

## 2015-12-12 ENCOUNTER — Encounter: Payer: Self-pay | Admitting: Internal Medicine

## 2015-12-12 ENCOUNTER — Ambulatory Visit (INDEPENDENT_AMBULATORY_CARE_PROVIDER_SITE_OTHER): Payer: 59 | Admitting: Internal Medicine

## 2015-12-12 VITALS — BP 130/84 | HR 82 | Ht 67.0 in | Wt 168.0 lb

## 2015-12-12 DIAGNOSIS — F17200 Nicotine dependence, unspecified, uncomplicated: Secondary | ICD-10-CM

## 2015-12-12 DIAGNOSIS — Z0000 Encounter for general adult medical examination without abnormal findings: Secondary | ICD-10-CM

## 2015-12-12 DIAGNOSIS — D496 Neoplasm of unspecified behavior of brain: Secondary | ICD-10-CM

## 2015-12-12 DIAGNOSIS — E041 Nontoxic single thyroid nodule: Secondary | ICD-10-CM | POA: Diagnosis not present

## 2015-12-12 DIAGNOSIS — E559 Vitamin D deficiency, unspecified: Secondary | ICD-10-CM

## 2015-12-12 MED ORDER — MELOXICAM 7.5 MG PO TABS: 7.5000 mg | ORAL_TABLET | Freq: Every day | ORAL | Status: DC

## 2015-12-12 NOTE — Assessment & Plan Note (Signed)
Labs

## 2015-12-12 NOTE — Assessment & Plan Note (Signed)
Discussed CXR.  

## 2015-12-12 NOTE — Assessment & Plan Note (Addendum)
MRI 02/04/15 Brain MRI w/L thyroid lobe 2.4 cm nodule -  02/04/15 Will sch an [place]

## 2015-12-12 NOTE — Assessment & Plan Note (Addendum)
We discussed age appropriate health related issues, including available/recomended screening tests and vaccinations. We discussed a need for adhering to healthy diet and exercise. Labs/EKG were reviewed/ordered. All questions were answered. Declined colonoscopy Opth q 24 mo Zostavax advised Cologuard (colonoscopy declined) Declined a pneumovax CXR EKG

## 2015-12-12 NOTE — Progress Notes (Signed)
Pre visit review using our clinic review tool, if applicable. No additional management support is needed unless otherwise documented below in the visit note. 

## 2015-12-12 NOTE — Progress Notes (Signed)
Subjective:  Patient ID: Blake [name], male    DOB: 1955/12/06  Age: 60 y.o. MRN: [id number]  CC: Annual Exam   HPI Blake [name] presents for a well exam  Outpatient Prescriptions Prior to Visit  Medication Sig Dispense Refill  . Cholecalciferol 1000 UNITS tablet Take 1,000 Units by mouth daily.    . Cyanocobalamin (VITAMIN B-12) 1000 MCG SUBL Place 1 tablet (1,000 mcg total) under the tongue daily. 100 tablet 3  . meloxicam (MOBIC) 7.5 MG tablet TAKE 1 TO 2 TABLETS BY MOUTH EVERY DAY AS NEEDED FOR PAIN 60 tablet 3  . Vitamin D, Ergocalciferol, (DRISDOL) 50000 UNITS CAPS capsule TAKE 1 CAPSULE (50,000 UNITS TOTAL) BY MOUTH ONCE A WEEK. 6 capsule 5   No facility-administered medications prior to visit.    ROS Review of Systems  Constitutional: Negative for fever, appetite change, fatigue and unexpected weight change.  HENT: Negative for congestion, nosebleeds, sneezing, sore throat and trouble swallowing.   Eyes: Negative for itching and visual disturbance.  Respiratory: Negative for cough.   Cardiovascular: Negative for chest pain, palpitations and leg swelling.  Gastrointestinal: Negative for nausea, vomiting, diarrhea, blood in stool and abdominal distention.  Genitourinary: Negative for frequency and hematuria.  Musculoskeletal: Negative for back pain, joint swelling, gait problem and neck pain.  Skin: Negative for rash.  Neurological: Negative for dizziness, tremors, speech difficulty, weakness, light-headedness and headaches.  Psychiatric/Behavioral: Negative for suicidal ideas, sleep disturbance, dysphoric mood and agitation. The patient is not nervous/anxious.     Objective:  BP 130/84 mmHg  Pulse 82  Ht 5\' 7"  (1.702 m)  Wt 168 lb (76.204 kg)  BMI 26.31 kg/m2  SpO2 95%  BP Readings from Last 3 Encounters:  12/12/15 130/84  02/09/14 120/80  11/06/12 112/80    Wt Readings from Last 3 Encounters:  12/12/15 168 lb (76.204 kg)  02/09/14 184 lb (83.462 kg)    11/06/12 178 lb (80.74 kg)    Physical Exam  Constitutional: He is oriented to person, place, and time. He appears well-developed and well-nourished. No distress.  HENT:  Head: Normocephalic and atraumatic.  Right Ear: External ear normal.  Left Ear: External ear normal.  Nose: Nose normal.  Mouth/Throat: Oropharynx is clear and moist. No oropharyngeal exudate.  Eyes: Conjunctivae and EOM are normal. Pupils are equal, round, and reactive to light. Right eye exhibits no discharge. Left eye exhibits no discharge. No scleral icterus.  Neck: Normal range of motion. Neck supple. No JVD present. No tracheal deviation present. No thyromegaly present.  No palpable nodules  Cardiovascular: Normal rate, regular rhythm, normal heart sounds and intact distal pulses.  Exam reveals no gallop and no friction rub.   No murmur heard. Pulmonary/Chest: Effort normal and breath sounds normal. No stridor. No respiratory distress. He has no wheezes. He has no rales. He exhibits no tenderness.  Abdominal: Soft. Bowel sounds are normal. He exhibits no distension and no mass. There is no tenderness. There is no rebound and no guarding.  Genitourinary: Rectum normal and penis normal. Guaiac negative stool. No penile tenderness.  Prostate 1+  Musculoskeletal: Normal range of motion. He exhibits no edema or tenderness.  Lymphadenopathy:    He has no cervical adenopathy.  Neurological: He is alert and oriented to person, place, and time. He has normal reflexes. No cranial nerve deficit. He exhibits normal muscle tone. Coordination normal.  Skin: Skin is warm and dry. No rash noted. He is not diaphoretic. No erythema. No pallor.  Psychiatric: He  has a normal mood and affect. His behavior is normal. Judgment and thought content normal.    Lab Results  Component Value Date   WBC 6.8 02/10/2014   HGB 15.4 02/10/2014   HCT 45.1 02/10/2014   PLT 258.0 02/10/2014   GLUCOSE 94 02/10/2014   CHOL 199 02/10/2014    TRIG 117.0 02/10/2014   HDL 36.70* 02/10/2014   LDLDIRECT 161.0 05/14/2012   LDLCALC 139* 02/10/2014   ALT 20 02/10/2014   AST 24 02/10/2014   NA 142 02/10/2014   K 4.3 02/10/2014   CL 109 02/10/2014   CREATININE 1.0 02/10/2014   BUN 14 02/10/2014   CO2 24 02/10/2014   TSH 0.69 02/10/2014   PSA 1.03 02/10/2014   INR 0.9 07/07/2008   EKG -IRBBB  Dg Elbow Complete Right  05/13/2012  *RADIOLOGY REPORT* Clinical Data: Right elbow pain, stiffness RIGHT ELBOW - COMPLETE 3+ VIEW Comparison: None. Findings: Four views of the right elbow submitted.  No acute fracture or subluxation.  No posterior fat pad sign.  Minimal spurring anterior aspect of the distal humerus. IMPRESSION: No acute fracture or subluxation. Original Report Authenticated By: Lahoma Crocker, M.D.   Assessment & Plan:   Blake was seen today for annual exam.  Diagnoses and all orders for this visit:  Well adult exam -     meloxicam (MOBIC) 7.5 MG tablet; Take 1-2 tablets (7.5-15 mg total) by mouth daily. -     EKG 12-Lead -     DG Chest 2 View  Brain tumor [facility]) -     DG Chest 2 View  Thyroid nodule -     US Soft Tissue Head/Neck -     DG Chest 2 View  Vitamin D deficiency  TOBACCO USER   I have discontinued Blake [name] Vitamin D (Ergocalciferol). I have also changed his meloxicam. Additionally, I am having him maintain his Cholecalciferol and Vitamin B-12.  Meds ordered this encounter  Medications  . meloxicam (MOBIC) 7.5 MG tablet    Sig: Take 1-2 tablets (7.5-15 mg total) by mouth daily.    Dispense:  60 tablet    Refill:  5     Follow-up: Return in about 1 year (around 12/11/2016) for Wellness Exam.  Walker Kehr, MD

## 2015-12-13 ENCOUNTER — Other Ambulatory Visit (INDEPENDENT_AMBULATORY_CARE_PROVIDER_SITE_OTHER): Payer: 59

## 2015-12-13 DIAGNOSIS — E559 Vitamin D deficiency, unspecified: Secondary | ICD-10-CM

## 2015-12-13 DIAGNOSIS — Z0000 Encounter for general adult medical examination without abnormal findings: Secondary | ICD-10-CM | POA: Diagnosis not present

## 2015-12-13 LAB — URINALYSIS
Bilirubin Urine: NEGATIVE
Hgb urine dipstick: NEGATIVE
KETONES UR: NEGATIVE
Leukocytes, UA: NEGATIVE
Nitrite: NEGATIVE
PH: 6 (ref 5.0–8.0)
SPECIFIC GRAVITY, URINE: 1.02 (ref 1.000–1.030)
Total Protein, Urine: NEGATIVE
URINE GLUCOSE: NEGATIVE
UROBILINOGEN UA: 0.2 (ref 0.0–1.0)

## 2015-12-13 LAB — BASIC METABOLIC PANEL
BUN: 12 mg/dL (ref 6–23)
CALCIUM: 9.6 mg/dL (ref 8.4–10.5)
CO2: 25 mEq/L (ref 19–32)
CREATININE: 0.97 mg/dL (ref 0.40–1.50)
Chloride: 106 mEq/L (ref 96–112)
GFR: 84.68 mL/min (ref >60.00)
Glucose, Bld: 103 mg/dL — ABNORMAL HIGH (ref 70–99)
Potassium: 4.8 mEq/L (ref 3.5–5.1)
Sodium: 142 mEq/L (ref 135–145)

## 2015-12-13 LAB — LIPID PANEL
CHOLESTEROL: 197 mg/dL (ref 0–200)
HDL: 44.9 mg/dL (ref >39.00)
LDL Cholesterol: 139 mg/dL — ABNORMAL HIGH (ref 0–99)
NonHDL: 151.82
Total CHOL/HDL Ratio: 4
Triglycerides: 66 mg/dL (ref 0.0–149.0)
VLDL: 13.2 mg/dL (ref 0.0–40.0)

## 2015-12-13 LAB — HEPATIC FUNCTION PANEL
ALK PHOS: 90 U/L (ref 39–117)
ALT: 15 U/L (ref 0–53)
AST: 20 U/L (ref 0–37)
Albumin: 4.5 g/dL (ref 3.5–5.2)
BILIRUBIN DIRECT: 0.1 mg/dL (ref 0.0–0.3)
BILIRUBIN TOTAL: 0.4 mg/dL (ref 0.2–1.2)
TOTAL PROTEIN: 6.8 g/dL (ref 6.0–8.3)

## 2015-12-13 LAB — CBC WITH DIFFERENTIAL/PLATELET
BASOS ABS: 0 10*3/uL (ref 0.0–0.1)
Basophils Relative: 0.4 % (ref 0.0–3.0)
EOS ABS: 0.2 10*3/uL (ref 0.0–0.7)
Eosinophils Relative: 2.3 % (ref 0.0–5.0)
HEMATOCRIT: 45.2 % (ref 39.0–52.0)
HEMOGLOBIN: 15.3 g/dL (ref 13.0–17.0)
LYMPHS PCT: 26.4 % (ref 12.0–46.0)
Lymphs Abs: 1.9 10*3/uL (ref 0.7–4.0)
MCHC: 34 g/dL (ref 30.0–36.0)
MCV: 84.9 fl (ref 78.0–100.0)
MONO ABS: 0.4 10*3/uL (ref 0.1–1.0)
Monocytes Relative: 5.2 % (ref 3.0–12.0)
NEUTROS ABS: 4.8 10*3/uL (ref 1.4–7.7)
Neutrophils Relative %: 65.7 % (ref 43.0–77.0)
PLATELETS: 277 10*3/uL (ref 150.0–400.0)
RBC: 5.32 Mil/uL (ref 4.22–5.81)
RDW: 13.6 % (ref 11.5–15.5)
WBC: 7.3 10*3/uL (ref 4.0–10.5)

## 2015-12-13 LAB — VITAMIN B12: VITAMIN B 12: 422 pg/mL (ref 211–911)

## 2015-12-13 LAB — VITAMIN D 25 HYDROXY (VIT D DEFICIENCY, FRACTURES): VITD: 21.57 ng/mL — ABNORMAL LOW (ref 30.00–100.00)

## 2015-12-13 LAB — PSA: PSA: 0.98 ng/mL (ref 0.10–4.00)

## 2015-12-13 LAB — TSH: TSH: 0.44 u[IU]/mL (ref 0.35–4.50)

## 2015-12-13 MED ORDER — VITAMIN D3 50 MCG (2000 UT) PO CAPS: 2000.0000 [IU] | ORAL_CAPSULE | Freq: Every day | ORAL | Status: DC

## 2015-12-13 MED ORDER — ERGOCALCIFEROL 1.25 MG (50000 UT) PO CAPS: 50000.0000 [IU] | ORAL_CAPSULE | ORAL | Status: DC

## 2015-12-14 LAB — HEPATITIS C ANTIBODY: HCV AB: NEGATIVE

## 2015-12-30 ENCOUNTER — Ambulatory Visit
Admission: RE | Admit: 2015-12-30 | Discharge: 2015-12-30 | Disposition: A | Discharge status: Home / Self Care | Payer: 59 | Source: Ambulatory Visit | Attending: Internal Medicine | Admitting: Internal Medicine

## 2016-01-01 ENCOUNTER — Other Ambulatory Visit: Payer: Self-pay | Admitting: Internal Medicine

## 2016-01-02 NOTE — Telephone Encounter (Signed)
Do you want pt to continue the vitamin d rx weekly?

## 2016-01-03 MED ORDER — ERGOCALCIFEROL 1.25 MG (50000 UT) PO CAPS: 50000.0000 [IU] | ORAL_CAPSULE | ORAL | Status: DC

## 2016-01-03 NOTE — Addendum Note (Signed)
Addended by: Cresenciano Lick on: 01/03/2016 08:43 AM   Modules accepted: Orders

## 2016-01-03 NOTE — Telephone Encounter (Signed)
Done. See meds.  

## 2016-01-05 LAB — COLOGUARD: Cologuard: NEGATIVE

## 2016-01-12 ENCOUNTER — Telehealth: Payer: Self-pay | Admitting: *Deleted

## 2016-01-12 NOTE — Telephone Encounter (Signed)
Pt informed of 01/05/16 negative Cologuard results.    He is asking about the wellness form he gave you at his 12/12/15 CPE. Do you by chance still have this form? I do not see where it has been scanned into his chart. If we completed it and faxed it back, it should have been scanned. If not, he wants me to call him and he will bring another copy. Please advise.

## 2016-12-18 ENCOUNTER — Encounter: Payer: Self-pay | Admitting: Internal Medicine

## 2016-12-18 ENCOUNTER — Ambulatory Visit (INDEPENDENT_AMBULATORY_CARE_PROVIDER_SITE_OTHER): Payer: 59 | Admitting: Internal Medicine

## 2016-12-18 VITALS — BP 112/76 | HR 73 | Temp 98.2°F | Resp 16 | Ht 66.0 in | Wt 175.5 lb

## 2016-12-18 DIAGNOSIS — D496 Neoplasm of unspecified behavior of brain: Secondary | ICD-10-CM

## 2016-12-18 DIAGNOSIS — Z0000 Encounter for general adult medical examination without abnormal findings: Secondary | ICD-10-CM

## 2016-12-18 DIAGNOSIS — E559 Vitamin D deficiency, unspecified: Secondary | ICD-10-CM

## 2016-12-18 DIAGNOSIS — F17200 Nicotine dependence, unspecified, uncomplicated: Secondary | ICD-10-CM | POA: Diagnosis not present

## 2016-12-18 DIAGNOSIS — E041 Nontoxic single thyroid nodule: Secondary | ICD-10-CM | POA: Diagnosis not present

## 2016-12-18 MED ORDER — MELOXICAM 7.5 MG PO TABS: 7.5000 mg | ORAL_TABLET | Freq: Every day | ORAL | 3 refills | Status: DC

## 2016-12-18 NOTE — Assessment & Plan Note (Signed)
discussed

## 2016-12-18 NOTE — Assessment & Plan Note (Signed)
Repeat US

## 2016-12-18 NOTE — Progress Notes (Signed)
Subjective:  Patient ID: Blake [name], male    DOB: 1956-01-22  Age: 61 y.o. MRN: [id number]  CC: Annual Exam   HPI Blake [name] presents for well exam  Outpatient Medications Prior to Visit  Medication Sig Dispense Refill  . Cholecalciferol (VITAMIN D3) 2000 units capsule Take 1 capsule (2,000 Units total) by mouth daily. 100 capsule 3  . Cyanocobalamin (VITAMIN B-12) 1000 MCG SUBL Place 1 tablet (1,000 mcg total) under the tongue daily. 100 tablet 3  . ergocalciferol (VITAMIN D2) 50000 units capsule Take 1 capsule (50,000 Units total) by mouth once a week. 6 capsule 0  . meloxicam (MOBIC) 7.5 MG tablet Take 1-2 tablets (7.5-15 mg total) by mouth daily. 60 tablet 5   No facility-administered medications prior to visit.     ROS Review of Systems  Constitutional: Negative for appetite change, fatigue and unexpected weight change.  HENT: Negative for congestion, nosebleeds, sneezing, sore throat and trouble swallowing.   Eyes: Negative for itching and visual disturbance.  Respiratory: Negative for cough.   Cardiovascular: Negative for chest pain, palpitations and leg swelling.  Gastrointestinal: Negative for abdominal distention, blood in stool, diarrhea and nausea.  Genitourinary: Negative for frequency and hematuria.  Musculoskeletal: Positive for arthralgias. Negative for back pain, gait problem, joint swelling and neck pain.  Skin: Negative for rash.  Neurological: Negative for dizziness, tremors, speech difficulty and weakness.  Psychiatric/Behavioral: Negative for agitation, dysphoric mood and sleep disturbance. The patient is not nervous/anxious.     Objective:  BP 112/76   Pulse 73   Temp 98.2 F (36.8 C) (Oral)   Resp 16   Ht 5\' 6"  (1.676 m)   Wt 175 lb 8 oz (79.6 kg)   SpO2 94%   BMI 28.33 kg/m   BP Readings from Last 3 Encounters:  12/18/16 112/76  12/12/15 130/84  02/09/14 120/80    Wt Readings from Last 3 Encounters:  12/18/16 175 lb 8 oz (79.6 kg)    12/12/15 168 lb (76.2 kg)  02/09/14 184 lb (83.5 kg)    Physical Exam  Constitutional: He is oriented to person, place, and time. He appears well-developed. No distress.  NAD  HENT:  Mouth/Throat: Oropharynx is clear and moist.  Eyes: Conjunctivae are normal. Pupils are equal, round, and reactive to light.  Neck: Normal range of motion. No JVD present. No thyromegaly present.  Cardiovascular: Normal rate, regular rhythm, normal heart sounds and intact distal pulses.  Exam reveals no gallop and no friction rub.   No murmur heard. Pulmonary/Chest: Effort normal and breath sounds normal. No respiratory distress. He has no wheezes. He has no rales. He exhibits no tenderness.  Abdominal: Soft. Bowel sounds are normal. He exhibits no distension and no mass. There is no tenderness. There is no rebound and no guarding.  Genitourinary: Rectum normal and prostate normal. Rectal exam shows guaiac negative stool.  Musculoskeletal: Normal range of motion. He exhibits no edema or tenderness.  Lymphadenopathy:    He has no cervical adenopathy.  Neurological: He is alert and oriented to person, place, and time. He has normal reflexes. No cranial nerve deficit. He exhibits normal muscle tone. He displays a negative Romberg sign. Coordination and gait normal.  Skin: Skin is warm and dry. No rash noted.  Psychiatric: He has a normal mood and affect. His behavior is normal. Judgment and thought content normal.    Lab Results  Component Value Date   WBC 7.3 12/13/2015   HGB 15.3 12/13/2015  HCT 45.2 12/13/2015   PLT 277.0 12/13/2015   GLUCOSE 103 (H) 12/13/2015   CHOL 197 12/13/2015   TRIG 66.0 12/13/2015   HDL 44.90 12/13/2015   LDLDIRECT 161.0 05/14/2012   LDLCALC 139 (H) 12/13/2015   ALT 15 12/13/2015   AST 20 12/13/2015   NA 142 12/13/2015   K 4.8 12/13/2015   CL 106 12/13/2015   CREATININE 0.97 12/13/2015   BUN 12 12/13/2015   CO2 25 12/13/2015   TSH 0.44 12/13/2015   PSA 0.98  12/13/2015   INR 0.9 07/07/2008    US Soft Tissue Head/neck  Result Date: 12/30/2015 CLINICAL DATA:  61 year old male with thyroid nodules EXAM: THYROID ULTRASOUND TECHNIQUE: Ultrasound examination of the thyroid gland and adjacent soft tissues was performed. COMPARISON:  Prior MRI of the cervical spine 07/07/2008 FINDINGS: Right thyroid lobe Measurements: 5.2 x 2.3 x 2.7 cm. Heterogeneous mixed cystic and solid nodule in the mid gland measures 1.4 x 1.0 x 1.1 cm. Additional smaller sub cm nodules scattered throughout the right gland. Left thyroid lobe Measurements: 6.5 x 1.9 x 2.4 cm. Heterogeneous mixed cystic and solid nodule in the upper gland measures 1.9 x 1.0 x 1.5 cm. A second mixed cystic and solid but predominantly solid nodule in the lower pole measures 1.2 x 0.7 x 1.1 cm. Additional small sub cm nodules scattered throughout the gland. Isthmus Thickness: 0.5 cm. Small sub cm hypoechoic solid nodule measuring up to 8 mm. Lymphadenopathy None visualized. IMPRESSION: Bilateral thyroid nodules. The largest on the right is mixed cystic and solid measuring up to 1.4 cm while the largest on the left is also mixed cystic and solid measuring up to 1.9 cm. Findings do not meet current SRU consensus criteria for biopsy. Follow-up by clinical exam is recommended. If patient has known risk factors for thyroid carcinoma, consider follow-up ultrasound in 12 months. If patient is clinically hyperthyroid, consider nuclear medicine thyroid uptake and scan.Reference: Management of Thyroid Nodules Detected at [place]: Society of Radiologists in [place]. Radiology 2005; [id number]. Electronically Signed   By: Jacqulynn Cadet M.D.   On: 12/30/2015 14:40    Assessment & Plan:   There are no diagnoses linked to this encounter. I am having Blake [name] maintain his Vitamin B-12, meloxicam, Vitamin D3, and ergocalciferol.  No orders of the defined types were placed in this  encounter.    Follow-up: No Follow-up on file.  Walker Kehr, MD

## 2016-12-18 NOTE — Assessment & Plan Note (Signed)
Vit D 

## 2016-12-18 NOTE — Assessment & Plan Note (Signed)
We discussed age appropriate health related issues, including available/recomended screening tests and vaccinations. We discussed a need for adhering to healthy diet and exercise. Labs/EKG were reviewed/ordered. All questions were answered. Opth q 24 mo Shingrix advised Cologuard (colonoscopy declined)

## 2016-12-18 NOTE — Progress Notes (Signed)
Pre-visit discussion using our clinic review tool. No additional management support is needed unless otherwise documented below in the visit note.  

## 2016-12-18 NOTE — Assessment & Plan Note (Signed)
MRI q 12 mo at [facility]

## 2016-12-19 ENCOUNTER — Other Ambulatory Visit (INDEPENDENT_AMBULATORY_CARE_PROVIDER_SITE_OTHER): Payer: 59

## 2016-12-19 DIAGNOSIS — Z0000 Encounter for general adult medical examination without abnormal findings: Secondary | ICD-10-CM

## 2016-12-19 LAB — BASIC METABOLIC PANEL
BUN: 14 mg/dL (ref 6–23)
CALCIUM: 9.7 mg/dL (ref 8.4–10.5)
CO2: 26 meq/L (ref 19–32)
Chloride: 108 mEq/L (ref 96–112)
Creatinine, Ser: 1.01 mg/dL (ref 0.40–1.50)
GFR: 80.53 mL/min (ref >60.00)
GLUCOSE: 111 mg/dL — AB (ref 70–99)
POTASSIUM: 4.7 meq/L (ref 3.5–5.1)
Sodium: 139 mEq/L (ref 135–145)

## 2016-12-19 LAB — URINALYSIS
Bilirubin Urine: NEGATIVE
Hgb urine dipstick: NEGATIVE
Ketones, ur: NEGATIVE
LEUKOCYTES UA: NEGATIVE
NITRITE: NEGATIVE
PH: 6 (ref 5.0–8.0)
SPECIFIC GRAVITY, URINE: 1.015 (ref 1.000–1.030)
Total Protein, Urine: NEGATIVE
Urine Glucose: NEGATIVE
Urobilinogen, UA: 0.2 (ref 0.0–1.0)

## 2016-12-19 LAB — CBC WITH DIFFERENTIAL/PLATELET
BASOS PCT: 0.5 % (ref 0.0–3.0)
Basophils Absolute: 0 10*3/uL (ref 0.0–0.1)
EOS ABS: 0.2 10*3/uL (ref 0.0–0.7)
EOS PCT: 3.2 % (ref 0.0–5.0)
HCT: 46.2 % (ref 39.0–52.0)
Hemoglobin: 15.6 g/dL (ref 13.0–17.0)
LYMPHS ABS: 1.9 10*3/uL (ref 0.7–4.0)
LYMPHS PCT: 27.9 % (ref 12.0–46.0)
MCHC: 33.8 g/dL (ref 30.0–36.0)
MCV: 86.4 fl (ref 78.0–100.0)
MONO ABS: 0.5 10*3/uL (ref 0.1–1.0)
Monocytes Relative: 6.7 % (ref 3.0–12.0)
NEUTROS PCT: 61.7 % (ref 43.0–77.0)
Neutro Abs: 4.2 10*3/uL (ref 1.4–7.7)
PLATELETS: 276 10*3/uL (ref 150.0–400.0)
RBC: 5.35 Mil/uL (ref 4.22–5.81)
RDW: 13.8 % (ref 11.5–15.5)
WBC: 6.8 10*3/uL (ref 4.0–10.5)

## 2016-12-19 LAB — HEPATIC FUNCTION PANEL
ALT: 15 U/L (ref 0–53)
AST: 19 U/L (ref 0–37)
Albumin: 4.3 g/dL (ref 3.5–5.2)
Alkaline Phosphatase: 91 U/L (ref 39–117)
BILIRUBIN TOTAL: 0.4 mg/dL (ref 0.2–1.2)
Bilirubin, Direct: 0.1 mg/dL (ref 0.0–0.3)
Total Protein: 6.6 g/dL (ref 6.0–8.3)

## 2016-12-19 LAB — PSA: PSA: 1.08 ng/mL (ref 0.10–4.00)

## 2016-12-19 LAB — LIPID PANEL
CHOL/HDL RATIO: 4
CHOLESTEROL: 199 mg/dL (ref 0–200)
HDL: 44.2 mg/dL (ref >39.00)
LDL CALC: 140 mg/dL — AB (ref 0–99)
NonHDL: 154.49
TRIGLYCERIDES: 72 mg/dL (ref 0.0–149.0)
VLDL: 14.4 mg/dL (ref 0.0–40.0)

## 2016-12-19 LAB — TSH: TSH: 1.18 u[IU]/mL (ref 0.35–4.50)

## 2017-01-01 ENCOUNTER — Ambulatory Visit
Admission: RE | Admit: 2017-01-01 | Discharge: 2017-01-01 | Disposition: A | Discharge status: Home / Self Care | Payer: 59 | Source: Ambulatory Visit | Attending: Internal Medicine | Admitting: Internal Medicine

## 2017-01-01 DIAGNOSIS — E041 Nontoxic single thyroid nodule: Secondary | ICD-10-CM

## 2017-01-01 DIAGNOSIS — E042 Nontoxic multinodular goiter: Secondary | ICD-10-CM | POA: Diagnosis not present

## 2017-01-09 ENCOUNTER — Other Ambulatory Visit: Payer: Self-pay | Admitting: Internal Medicine

## 2017-01-09 ENCOUNTER — Encounter: Payer: Self-pay | Admitting: Internal Medicine

## 2017-01-09 DIAGNOSIS — E049 Nontoxic goiter, unspecified: Secondary | ICD-10-CM

## 2017-01-29 DIAGNOSIS — I2109 ST elevation (STEMI) myocardial infarction involving other coronary artery of anterior wall: Secondary | ICD-10-CM

## 2017-01-29 DIAGNOSIS — I2510 Atherosclerotic heart disease of native coronary artery without angina pectoris: Secondary | ICD-10-CM

## 2017-01-29 HISTORY — DX: Atherosclerotic heart disease of native coronary artery without angina pectoris: I25.10

## 2017-01-29 HISTORY — DX: ST elevation (STEMI) myocardial infarction involving other coronary artery of anterior wall: I21.09

## 2017-02-14 ENCOUNTER — Encounter: Payer: Self-pay | Admitting: Endocrinology

## 2017-02-14 ENCOUNTER — Ambulatory Visit (INDEPENDENT_AMBULATORY_CARE_PROVIDER_SITE_OTHER): Payer: 59 | Admitting: Endocrinology

## 2017-02-14 VITALS — BP 130/80 | HR 78 | Ht 67.0 in | Wt 172.0 lb

## 2017-02-14 DIAGNOSIS — E041 Nontoxic single thyroid nodule: Secondary | ICD-10-CM

## 2017-02-14 NOTE — Patient Instructions (Signed)
I would be happy to request the biopsy.  However, I would just have the ultrasound rechecked in 1 year if I was you.  I would be happy to see you back here, or Dr Alain Marion would be happy to request. If next year's is unchanged, you could reduce to ultrasound to every 2 years. most of the time, a "lumpy thyroid" will eventually become overactive.  this is usually a slow process, happening over the span of many years.  I would be happy to see you back here as needed.

## 2017-02-14 NOTE — Progress Notes (Signed)
Subjective:    Patient ID: Blake [name], male    DOB: March 29, 1956, 61 y.o.   MRN: [id number]  HPI Pt is referred by Dr Alain Marion, for nodular thyroid.  On 2016 MRI, pt was incidentally noted to have a nodular thyroid.  He is unaware of ever having had thyroid problems in the past.  He has no h/o surgery to the neck.  He had XRT to the brain in 2009, but he says the neck was not included.   Past Medical History:  Diagnosis Date  . Cancer [facility]) 2009   brain tumor  . LUMBAR RADICULOPATHY, RIGHT 07/02/2008   Qualifier: Diagnosis of  By: Alain Marion MD, Evie Lacks Memory loss 07/05/2008   Qualifier: Diagnosis of  By: Alain Marion MD, [place] 07/05/2008   Qualifier: Diagnosis of  By: Alain Marion MD, Evie Lacks Neoplasm of unspecified nature of brain 08/18/2008   Qualifier: Diagnosis of  By: Alain Marion MD, Evie Lacks Rash and other nonspecific skin eruption 08/18/2008   Qualifier: Diagnosis of  By: Doralee Albino    . TOBACCO USER 11/28/2009   Qualifier: Diagnosis of  By: Plotnikov MD, Evie Lacks   . VITAMIN D DEFICIENCY 07/02/2008   Qualifier: Diagnosis of  By: Plotnikov MD, Evie Lacks     Past Surgical History:  Procedure Laterality Date  . CRANIECTOMY / [place] TUMOR  2009   surgery and XRT at [place] History  . Marital status: Married    Spouse name: N/A  . Number of children: 1  . Years of education: N/A   Occupational History  . 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 Smoking status: Light Tobacco Smoker    Packs/day: 0.07    Types: Cigarettes  . Smokeless tobacco: Never Used  . Alcohol use No  . Drug use: No  . Sexual activity: Yes   Other Topics Concern  . Not on file   Social History Narrative  . No narrative on file    Current Outpatient Prescriptions on File Prior to Visit  Medication Sig Dispense Refill  . Cholecalciferol (VITAMIN D3) 2000 units capsule Take 1  capsule (2,000 Units total) by mouth daily. 100 capsule 3  . Cyanocobalamin (VITAMIN B-12) 1000 MCG SUBL Place 1 tablet (1,000 mcg total) under the tongue daily. 100 tablet 3  . ergocalciferol (VITAMIN D2) 50000 units capsule Take 1 capsule (50,000 Units total) by mouth once a week. 6 capsule 0  . meloxicam (MOBIC) 7.5 MG tablet Take 1-2 tablets (7.5-15 mg total) by mouth daily. 60 tablet 3   No current facility-administered medications on file prior to visit.     No Known Allergies  Family History  Problem Relation Age of Onset  . Alzheimer's disease Father   . Parkinsonism Father   . Arthritis Mother   . Goiter Mother   . Hypertension Other     BP 130/80   Pulse 78   Ht 5\' 7"  (1.702 m)   Wt 172 lb (78 kg)   SpO2 97%   BMI 26.94 kg/m    Review of Systems Denies weight change, headache, hoarseness, neck pain, visual loss, chest pain, sob, cough, dysphagia, diarrhea, itching, flushing, easy bruising, depression, cold intolerance, numbness, and rhinorrhea.       Objective:   Physical Exam VS: see vs page GEN: no distress HEAD: head: no  deformity eyes: no periorbital swelling, no proptosis.  external nose and ears are normal.  mouth: no lesion seen.   NECK: thyroid has irreg surface, but I can't palpate any particular nodule.  CHEST WALL: no deformity.  LUNGS: clear to auscultation.  CV: reg rate and rhythm, no murmur.  ABD: abdomen is soft, nontender.  no hepatosplenomegaly.  not distended.  no hernia MUSCULOSKELETAL: muscle bulk and strength are grossly normal.  no obvious joint swelling.  gait is normal and steady EXTEMITIES: no deformity.  no edema PULSES: no carotid bruit NEURO:  cn 2-12 grossly intact.   readily moves all 4's.  sensation is intact to touch on all 4's.  SKIN:  Normal texture and temperature.  No rash or suspicious lesion is visible.   NODES:  None palpable at the neck PSYCH: alert, well-oriented.  Does not appear anxious nor depressed.  Lab  Results  Component Value Date   TSH 1.18 12/19/2016    [place]: Bilateral thyroid nodules. The largest on the right is mixed cystic and solid measuring up to 1.4 cm while the largest on the left is also mixed cystic and solid measuring up to 1.9 cm. Findings do not meet current SRU consensus criteria for biopsy.    Assessment & Plan:  Multinodular goiter, new.  For now, all he needs is surveillance.  Patient Instructions  I would be happy to request the biopsy.  However, I would just have the ultrasound rechecked in 1 year if I was you.  I would be happy to see you back here, or Dr Alain Marion would be happy to request. If next year's is unchanged, you could reduce to ultrasound to every 2 years. most of the time, a "lumpy thyroid" will eventually become overactive.  this is usually a slow process, happening over the span of many years.  I would be happy to see you back here as needed.

## 2017-02-22 DIAGNOSIS — D320 Benign neoplasm of cerebral meninges: Secondary | ICD-10-CM | POA: Diagnosis not present

## 2017-02-22 DIAGNOSIS — Z923 Personal history of irradiation: Secondary | ICD-10-CM | POA: Diagnosis not present

## 2017-02-23 ENCOUNTER — Inpatient Hospital Stay (HOSPITAL_COMMUNITY)
Admission: EM | Admit: 2017-02-23 | Discharge: 2017-02-26 | DRG: 246 | Disposition: A | Discharge status: Home / Self Care | Payer: 59 | Attending: Cardiology | Admitting: Cardiology

## 2017-02-23 ENCOUNTER — Encounter (HOSPITAL_COMMUNITY)
Admission: EM | Disposition: A | Discharge status: Home / Self Care | Payer: Self-pay | Source: Home / Self Care | Attending: Cardiology

## 2017-02-23 ENCOUNTER — Encounter (HOSPITAL_COMMUNITY): Payer: Self-pay | Admitting: Emergency Medicine

## 2017-02-23 ENCOUNTER — Emergency Department (HOSPITAL_COMMUNITY): Payer: 59

## 2017-02-23 DIAGNOSIS — I5021 Acute systolic (congestive) heart failure: Secondary | ICD-10-CM | POA: Diagnosis not present

## 2017-02-23 DIAGNOSIS — Z955 Presence of coronary angioplasty implant and graft: Secondary | ICD-10-CM

## 2017-02-23 DIAGNOSIS — I4510 Unspecified right bundle-branch block: Secondary | ICD-10-CM | POA: Diagnosis not present

## 2017-02-23 DIAGNOSIS — R0789 Other chest pain: Secondary | ICD-10-CM | POA: Diagnosis not present

## 2017-02-23 DIAGNOSIS — I5042 Chronic combined systolic (congestive) and diastolic (congestive) heart failure: Secondary | ICD-10-CM | POA: Diagnosis present

## 2017-02-23 DIAGNOSIS — Z791 Long term (current) use of non-steroidal anti-inflammatories (NSAID): Secondary | ICD-10-CM | POA: Diagnosis not present

## 2017-02-23 DIAGNOSIS — Z87891 Personal history of nicotine dependence: Secondary | ICD-10-CM | POA: Diagnosis present

## 2017-02-23 DIAGNOSIS — I2102 ST elevation (STEMI) myocardial infarction involving left anterior descending coronary artery: Secondary | ICD-10-CM | POA: Diagnosis not present

## 2017-02-23 DIAGNOSIS — Z79899 Other long term (current) drug therapy: Secondary | ICD-10-CM | POA: Diagnosis not present

## 2017-02-23 DIAGNOSIS — F17200 Nicotine dependence, unspecified, uncomplicated: Secondary | ICD-10-CM | POA: Diagnosis not present

## 2017-02-23 DIAGNOSIS — E785 Hyperlipidemia, unspecified: Secondary | ICD-10-CM | POA: Diagnosis not present

## 2017-02-23 DIAGNOSIS — I255 Ischemic cardiomyopathy: Secondary | ICD-10-CM | POA: Diagnosis not present

## 2017-02-23 DIAGNOSIS — E559 Vitamin D deficiency, unspecified: Secondary | ICD-10-CM | POA: Diagnosis not present

## 2017-02-23 DIAGNOSIS — I2510 Atherosclerotic heart disease of native coronary artery without angina pectoris: Secondary | ICD-10-CM | POA: Diagnosis present

## 2017-02-23 DIAGNOSIS — I2109 ST elevation (STEMI) myocardial infarction involving other coronary artery of anterior wall: Secondary | ICD-10-CM

## 2017-02-23 DIAGNOSIS — F17210 Nicotine dependence, cigarettes, uncomplicated: Secondary | ICD-10-CM | POA: Diagnosis present

## 2017-02-23 DIAGNOSIS — R079 Chest pain, unspecified: Secondary | ICD-10-CM | POA: Diagnosis not present

## 2017-02-23 DIAGNOSIS — I5041 Acute combined systolic (congestive) and diastolic (congestive) heart failure: Secondary | ICD-10-CM | POA: Diagnosis present

## 2017-02-23 DIAGNOSIS — Z8249 Family history of ischemic heart disease and other diseases of the circulatory system: Secondary | ICD-10-CM | POA: Diagnosis not present

## 2017-02-23 DIAGNOSIS — I501 Left ventricular failure: Secondary | ICD-10-CM | POA: Diagnosis not present

## 2017-02-23 DIAGNOSIS — E876 Hypokalemia: Secondary | ICD-10-CM | POA: Diagnosis present

## 2017-02-23 DIAGNOSIS — I213 ST elevation (STEMI) myocardial infarction of unspecified site: Secondary | ICD-10-CM | POA: Diagnosis present

## 2017-02-23 HISTORY — DX: Hyperlipidemia, unspecified: E78.5

## 2017-02-23 HISTORY — PX: CORONARY STENT INTERVENTION: CATH118234

## 2017-02-23 HISTORY — PX: CORONARY/GRAFT ACUTE MI REVASCULARIZATION: CATH118305

## 2017-02-23 HISTORY — DX: Atherosclerotic heart disease of native coronary artery without angina pectoris: I25.10

## 2017-02-23 HISTORY — PX: LEFT HEART CATH AND CORONARY ANGIOGRAPHY: CATH118249

## 2017-02-23 LAB — CBC WITH DIFFERENTIAL/PLATELET
BASOS PCT: 0 %
Basophils Absolute: 0 10*3/uL (ref 0.0–0.1)
EOS ABS: 0 10*3/uL (ref 0.0–0.7)
Eosinophils Relative: 0 %
HCT: 43 % (ref 39.0–52.0)
HEMOGLOBIN: 14.4 g/dL (ref 13.0–17.0)
Lymphocytes Relative: 11 %
Lymphs Abs: 1.5 10*3/uL (ref 0.7–4.0)
MCH: 28.8 pg (ref 26.0–34.0)
MCHC: 33.5 g/dL (ref 30.0–36.0)
MCV: 86 fL (ref 78.0–100.0)
Monocytes Absolute: 0.6 10*3/uL (ref 0.1–1.0)
Monocytes Relative: 5 %
NEUTROS PCT: 84 %
Neutro Abs: 11.3 10*3/uL — ABNORMAL HIGH (ref 1.7–7.7)
PLATELETS: 266 10*3/uL (ref 150–400)
RBC: 5 MIL/uL (ref 4.22–5.81)
RDW: 13.6 % (ref 11.5–15.5)
WBC: 13.5 10*3/uL — AB (ref 4.0–10.5)

## 2017-02-23 LAB — LIPID PANEL
CHOLESTEROL: 167 mg/dL (ref 0–200)
HDL: 42 mg/dL (ref >40)
LDL CALC: 111 mg/dL — AB (ref 0–99)
TRIGLYCERIDES: 72 mg/dL (ref <150)
Total CHOL/HDL Ratio: 4 RATIO
VLDL: 14 mg/dL (ref 0–40)

## 2017-02-23 LAB — POCT I-STAT, CHEM 8
BUN: 11 mg/dL (ref 6–20)
CALCIUM ION: 1.13 mmol/L — AB (ref 1.15–1.40)
Chloride: 108 mmol/L (ref 101–111)
Creatinine, Ser: 0.6 mg/dL — ABNORMAL LOW (ref 0.61–1.24)
Glucose, Bld: 125 mg/dL — ABNORMAL HIGH (ref 65–99)
HCT: 41 % (ref 39.0–52.0)
HEMOGLOBIN: 13.9 g/dL (ref 13.0–17.0)
Potassium: 3.5 mmol/L (ref 3.5–5.1)
Sodium: 142 mmol/L (ref 135–145)
TCO2: 21 mmol/L (ref 0–100)

## 2017-02-23 LAB — COMPREHENSIVE METABOLIC PANEL
ALBUMIN: 4.3 g/dL (ref 3.5–5.0)
ALK PHOS: 91 U/L (ref 38–126)
ALT: 24 U/L (ref 17–63)
ANION GAP: 10 (ref 5–15)
AST: 51 U/L — ABNORMAL HIGH (ref 15–41)
BUN: 12 mg/dL (ref 6–20)
CHLORIDE: 107 mmol/L (ref 101–111)
CO2: 21 mmol/L — AB (ref 22–32)
Calcium: 9 mg/dL (ref 8.9–10.3)
Creatinine, Ser: 0.99 mg/dL (ref 0.61–1.24)
GFR calc Af Amer: >60 mL/min (ref >60)
GFR calc non Af Amer: >60 mL/min (ref >60)
Glucose, Bld: 125 mg/dL — ABNORMAL HIGH (ref 65–99)
POTASSIUM: 3.6 mmol/L (ref 3.5–5.1)
SODIUM: 138 mmol/L (ref 135–145)
Total Bilirubin: 0.5 mg/dL (ref 0.3–1.2)
Total Protein: 6.9 g/dL (ref 6.5–8.1)

## 2017-02-23 LAB — TSH: TSH: 0.521 u[IU]/mL (ref 0.350–4.500)

## 2017-02-23 LAB — MRSA PCR SCREENING: MRSA by PCR: NEGATIVE

## 2017-02-23 LAB — HEPARIN LEVEL (UNFRACTIONATED)

## 2017-02-23 LAB — TROPONIN I
Troponin I: >65 ng/mL (ref <0.03)
Troponin I: >65 ng/mL (ref <0.03)

## 2017-02-23 LAB — I-STAT TROPONIN, ED: Troponin i, poc: 1.18 ng/mL (ref 0.00–0.08)

## 2017-02-23 LAB — I-STAT CG4 LACTIC ACID, ED: LACTIC ACID, VENOUS: 1.66 mmol/L (ref 0.5–1.9)

## 2017-02-23 LAB — POCT ACTIVATED CLOTTING TIME: Activated Clotting Time: 312 seconds

## 2017-02-23 LAB — MAGNESIUM: Magnesium: 1.9 mg/dL (ref 1.7–2.4)

## 2017-02-23 SURGERY — LEFT HEART CATH AND CORONARY ANGIOGRAPHY
Anesthesia: LOCAL

## 2017-02-23 MED ORDER — SODIUM CHLORIDE 0.9% FLUSH: 3.0000 mL | INTRAVENOUS | Status: DC | PRN

## 2017-02-23 MED ORDER — FUROSEMIDE 10 MG/ML IJ SOLN
40.0000 mg | Freq: Once | INTRAMUSCULAR | Status: AC
  Administered 2017-02-23: 40 mg via INTRAVENOUS
  Filled 2017-02-23: qty 4

## 2017-02-23 MED ORDER — ACETAMINOPHEN 325 MG PO TABS: 650.0000 mg | ORAL_TABLET | ORAL | Status: DC | PRN

## 2017-02-23 MED ORDER — LABETALOL HCL 5 MG/ML IV SOLN: 10.0000 mg | INTRAVENOUS | Status: AC | PRN

## 2017-02-23 MED ORDER — LIDOCAINE HCL (PF) 1 % IJ SOLN
INTRAMUSCULAR | Status: DC | PRN
Start: 2017-02-23 — End: 2017-02-23
  Administered 2017-02-23: 2 mL

## 2017-02-23 MED ORDER — VERAPAMIL HCL 2.5 MG/ML IV SOLN
INTRAVENOUS | Status: AC
  Filled 2017-02-23: qty 2

## 2017-02-23 MED ORDER — ONDANSETRON HCL 4 MG/2ML IJ SOLN: 4.0000 mg | Freq: Four times a day (QID) | INTRAMUSCULAR | Status: DC | PRN

## 2017-02-23 MED ORDER — HEPARIN SODIUM (PORCINE) 1000 UNIT/ML IJ SOLN
INTRAMUSCULAR | Status: AC
  Filled 2017-02-23: qty 1

## 2017-02-23 MED ORDER — HEPARIN (PORCINE) IN NACL 2-0.9 UNIT/ML-% IJ SOLN
INTRAMUSCULAR | Status: AC
  Filled 2017-02-23: qty 1000

## 2017-02-23 MED ORDER — TIROFIBAN HCL IN NACL 5-0.9 MG/100ML-% IV SOLN
0.1500 ug/kg/min | INTRAVENOUS | Status: AC
  Administered 2017-02-23 – 2017-02-24 (×2): 0.15 ug/kg/min via INTRAVENOUS
  Filled 2017-02-23 (×2): qty 100

## 2017-02-23 MED ORDER — MIDAZOLAM HCL 2 MG/2ML IJ SOLN
INTRAMUSCULAR | Status: AC
  Filled 2017-02-23: qty 2

## 2017-02-23 MED ORDER — MIDAZOLAM HCL 2 MG/2ML IJ SOLN
INTRAMUSCULAR | Status: DC | PRN
  Administered 2017-02-23: 2 mg via INTRAVENOUS

## 2017-02-23 MED ORDER — TIROFIBAN (AGGRASTAT) BOLUS VIA INFUSION
INTRAVENOUS | Status: DC | PRN
  Administered 2017-02-23: 1950 ug via INTRAVENOUS

## 2017-02-23 MED ORDER — SODIUM CHLORIDE 0.9 % IV SOLN: 250.0000 mL | INTRAVENOUS | Status: DC | PRN

## 2017-02-23 MED ORDER — ASPIRIN 81 MG PO CHEW
81.0000 mg | CHEWABLE_TABLET | Freq: Every day | ORAL | Status: DC
  Administered 2017-02-24 – 2017-02-26 (×3): 81 mg via ORAL
  Filled 2017-02-23 (×3): qty 1

## 2017-02-23 MED ORDER — SODIUM CHLORIDE 0.9% FLUSH
3.0000 mL | Freq: Two times a day (BID) | INTRAVENOUS | Status: DC
  Administered 2017-02-23: 10 mL via INTRAVENOUS
  Administered 2017-02-24 – 2017-02-26 (×4): 3 mL via INTRAVENOUS

## 2017-02-23 MED ORDER — HEPARIN SODIUM (PORCINE) 5000 UNIT/ML IJ SOLN
4000.0000 [IU] | Freq: Once | INTRAMUSCULAR | Status: AC
  Administered 2017-02-23: 4000 [IU] via INTRAVENOUS

## 2017-02-23 MED ORDER — TIROFIBAN HCL IN NACL 5-0.9 MG/100ML-% IV SOLN
INTRAVENOUS | Status: AC | PRN
  Administered 2017-02-23: 0.15 ug/kg/min via INTRAVENOUS

## 2017-02-23 MED ORDER — TIROFIBAN HCL IN NACL 5-0.9 MG/100ML-% IV SOLN
INTRAVENOUS | Status: AC
  Filled 2017-02-23: qty 100

## 2017-02-23 MED ORDER — IOPAMIDOL (ISOVUE-370) INJECTION 76%
INTRAVENOUS | Status: AC
  Filled 2017-02-23: qty 100

## 2017-02-23 MED ORDER — HEPARIN (PORCINE) IN NACL 100-0.45 UNIT/ML-% IJ SOLN
1000.0000 [IU]/h | INTRAMUSCULAR | Status: DC
  Filled 2017-02-23: qty 250

## 2017-02-23 MED ORDER — METOPROLOL TARTRATE 12.5 MG HALF TABLET
12.5000 mg | ORAL_TABLET | Freq: Two times a day (BID) | ORAL | Status: DC
  Administered 2017-02-23 – 2017-02-24 (×2): 12.5 mg via ORAL
  Filled 2017-02-23 (×2): qty 1

## 2017-02-23 MED ORDER — VERAPAMIL HCL 2.5 MG/ML IV SOLN
INTRAVENOUS | Status: DC | PRN
  Administered 2017-02-23: 16:00:00 via INTRA_ARTERIAL

## 2017-02-23 MED ORDER — TICAGRELOR 90 MG PO TABS
ORAL_TABLET | ORAL | Status: DC | PRN
  Administered 2017-02-23: 180 mg via ORAL

## 2017-02-23 MED ORDER — LIDOCAINE HCL (PF) 1 % IJ SOLN
INTRAMUSCULAR | Status: AC
  Filled 2017-02-23: qty 30

## 2017-02-23 MED ORDER — HEPARIN (PORCINE) IN NACL 2-0.9 UNIT/ML-% IJ SOLN
INTRAMUSCULAR | Status: AC | PRN
  Administered 2017-02-23: 1000 mL

## 2017-02-23 MED ORDER — NICOTINE 14 MG/24HR TD PT24
14.0000 mg | MEDICATED_PATCH | Freq: Every day | TRANSDERMAL | Status: DC
  Administered 2017-02-23 – 2017-02-26 (×4): 14 mg via TRANSDERMAL
  Filled 2017-02-23 (×4): qty 1

## 2017-02-23 MED ORDER — TICAGRELOR 90 MG PO TABS
90.0000 mg | ORAL_TABLET | Freq: Two times a day (BID) | ORAL | Status: DC
  Administered 2017-02-24 – 2017-02-26 (×5): 90 mg via ORAL
  Filled 2017-02-23 (×5): qty 1

## 2017-02-23 MED ORDER — HEPARIN SODIUM (PORCINE) 1000 UNIT/ML IJ SOLN
INTRAMUSCULAR | Status: DC | PRN
  Administered 2017-02-23: 7000 [IU] via INTRAVENOUS

## 2017-02-23 MED ORDER — TICAGRELOR 90 MG PO TABS
ORAL_TABLET | ORAL | Status: AC
  Filled 2017-02-23: qty 2

## 2017-02-23 MED ORDER — IOPAMIDOL (ISOVUE-370) INJECTION 76%
INTRAVENOUS | Status: DC | PRN
  Administered 2017-02-23: 210 mL via INTRA_ARTERIAL

## 2017-02-23 MED ORDER — HEPARIN SODIUM (PORCINE) 5000 UNIT/ML IJ SOLN
INTRAMUSCULAR | Status: AC
  Filled 2017-02-23: qty 1

## 2017-02-23 MED ORDER — SODIUM CHLORIDE 0.9 % IV SOLN
INTRAVENOUS | Status: AC | PRN
  Administered 2017-02-23: 80 mL/h via INTRAVENOUS
  Administered 2017-02-23: 500 mL

## 2017-02-23 MED ORDER — FENTANYL CITRATE (PF) 100 MCG/2ML IJ SOLN
INTRAMUSCULAR | Status: DC | PRN
  Administered 2017-02-23 (×2): 25 ug via INTRAVENOUS

## 2017-02-23 MED ORDER — FENTANYL CITRATE (PF) 100 MCG/2ML IJ SOLN
INTRAMUSCULAR | Status: AC
  Filled 2017-02-23: qty 2

## 2017-02-23 MED ORDER — IOPAMIDOL (ISOVUE-370) INJECTION 76%
INTRAVENOUS | Status: AC
  Filled 2017-02-23: qty 125

## 2017-02-23 MED ORDER — SODIUM CHLORIDE 0.9 % IV SOLN: INTRAVENOUS | Status: AC

## 2017-02-23 MED ORDER — NITROGLYCERIN 0.4 MG SL SUBL: 0.4000 mg | SUBLINGUAL_TABLET | SUBLINGUAL | Status: DC | PRN

## 2017-02-23 MED ORDER — MORPHINE SULFATE (PF) 4 MG/ML IV SOLN: 1.0000 mg | INTRAVENOUS | Status: DC | PRN

## 2017-02-23 SURGICAL SUPPLY — 17 items
BALLN EMERGE MR 2.5X12 (BALLOONS) ×2
BALLOON EMERGE MR 2.5X12 (BALLOONS) IMPLANT
CATH INFINITI 5FR ANG PIGTAIL (CATHETERS) ×1 IMPLANT
CATH INFINITI JR4 5F (CATHETERS) ×1 IMPLANT
CATH VISTA GUIDE 6FR XBLAD3.5 (CATHETERS) ×1 IMPLANT
DEVICE RAD COMP TR BAND LRG (VASCULAR PRODUCTS) ×1 IMPLANT
GLIDESHEATH SLEND A-KIT 6F 22G (SHEATH) ×1 IMPLANT
GUIDEWIRE INQWIRE 1.5J.035X260 (WIRE) IMPLANT
INQWIRE 1.5J .035X260CM (WIRE) ×4
KIT ENCORE 26 ADVANTAGE (KITS) ×1 IMPLANT
KIT HEART LEFT (KITS) ×2 IMPLANT
PACK CARDIAC CATHETERIZATION (CUSTOM PROCEDURE TRAY) ×2 IMPLANT
STENT SYNERGY DES 3X24 (Permanent Stent) ×1 IMPLANT
SYR MEDRAD MARK V 150ML (SYRINGE) ×2 IMPLANT
TRANSDUCER W/STOPCOCK (MISCELLANEOUS) ×2 IMPLANT
TUBING CIL FLEX 10 FLL-RA (TUBING) ×2 IMPLANT
WIRE MARVEL STR TIP 190CM (WIRE) ×1 IMPLANT

## 2017-02-23 NOTE — Progress Notes (Signed)
ANTICOAGULATION CONSULT NOTE - Initial Consult  Pharmacy Consult for heparin Indication: chest pain/ACS  No Known Allergies  Patient Measurements: Height: 5\' 7"  (170.2 cm) Weight: 172 lb (78 kg) IBW/kg (Calculated) : 66.1 Heparin Dosing Weight: 78kg  Vital Signs: BP: 140/102 (05/26 1504) Pulse Rate: 93 (05/26 1504)  Labs: No results for input(s): HGB, HCT, PLT, APTT, LABPROT, INR, HEPARINUNFRC, HEPRLOWMOCWT, CREATININE, CKTOTAL, CKMB, TROPONINI in the last 72 hours.  CrCl cannot be calculated (Patient's most recent lab result is older than the maximum 21 days allowed.).   Medical History: Past Medical History:  Diagnosis Date  . Cancer [facility]) 2009   brain tumor  . LUMBAR RADICULOPATHY, RIGHT 07/02/2008   Qualifier: Diagnosis of  By: Alain Marion MD, Evie Lacks Memory loss 07/05/2008   Qualifier: Diagnosis of  By: Alain Marion MD, [place] 07/05/2008   Qualifier: Diagnosis of  By: Alain Marion MD, Evie Lacks Neoplasm of unspecified nature of brain 08/18/2008   Qualifier: Diagnosis of  By: Alain Marion MD, Evie Lacks Rash and other nonspecific skin eruption 08/18/2008   Qualifier: Diagnosis of  By: Doralee Albino    . TOBACCO USER 11/28/2009   Qualifier: Diagnosis of  By: Plotnikov MD, Evie Lacks   . VITAMIN D DEFICIENCY 07/02/2008   Qualifier: Diagnosis of  By: Plotnikov MD, Evie Lacks     Medications:  Infusions:  . heparin      Assessment: 88 yom presented to the ED with CP. To start IV heparin. Already given bolus of 4000 units. Baseline CBC is pending but has been normal in the past. He is not on anticoagulation PTA.   Goal of Therapy:  Heparin level 0.3-0.7 units/ml Monitor platelets by anticoagulation protocol: Yes   Plan:  Heparin bolus 4000 units IV x 1 - already given Heparin gtt 1000 units/hr Check a 6 hr heparin level Daily heparin level and CBC  Louvenia Golomb, Rande Lawman 02/23/2017,3:11 PM

## 2017-02-23 NOTE — ED Provider Notes (Signed)
[place] DEPT Provider Note   CSN: [id number] Arrival date & time: 02/23/17  1502     History   Chief Complaint Chief Complaint  Patient presents with  . Code STEMI    HPI Blake [name] is a 61 y.o. male.  CP since 1130am. Pressure pain. +SOB. No n/v, diaphoresis. Went to urgent care. Given full dose ASA and nitro with improvement in sx. EKG concerning for STEMI. Sent here.   The history is provided by the patient and the EMS personnel.  Illness  This is a new problem. Episode onset: 3.5 hours. The problem occurs constantly. The problem has been gradually improving. Associated symptoms include chest pain and shortness of breath. Pertinent negatives include no headaches. Relieved by: nitro. The treatment provided moderate relief.    Past Medical History:  Diagnosis Date  . Cancer [facility]) 2009   brain tumor  . LUMBAR RADICULOPATHY, RIGHT 07/02/2008   Qualifier: Diagnosis of  By: Alain Marion MD, Evie Lacks Memory loss 07/05/2008   Qualifier: Diagnosis of  By: Alain Marion MD, [place] 07/05/2008   Qualifier: Diagnosis of  By: Alain Marion MD, Evie Lacks Neoplasm of unspecified nature of brain 08/18/2008   Qualifier: Diagnosis of  By: Alain Marion MD, Evie Lacks Rash and other nonspecific skin eruption 08/18/2008   Qualifier: Diagnosis of  By: Doralee Albino    . TOBACCO USER 11/28/2009   Qualifier: Diagnosis of  By: Plotnikov MD, Evie Lacks   . VITAMIN D DEFICIENCY 07/02/2008   Qualifier: Diagnosis of  By: Plotnikov MD, Evie Lacks     Patient Active Problem List   Diagnosis Date Noted  . Thyroid nodule 12/12/2015  . Brain tumor ([place]) 02/09/2014  . Well adult exam 05/13/2012  . Right elbow pain 05/13/2012  . TOBACCO USER 11/28/2009  . Neoplasm of unspecified nature of brain 08/18/2008  . Vitamin D deficiency 07/02/2008    Past Surgical History:  Procedure Laterality Date  . CRANIECTOMY / [place] TUMOR  2009   surgery  and XRT at [facility] Medications    Prior to Admission medications   Medication Sig Start Date End Date Taking? Authorizing Provider  Cholecalciferol (VITAMIN D3) 2000 units capsule Take 1 capsule (2,000 Units total) by mouth daily. 12/13/15   Plotnikov, Evie Lacks, MD  Cyanocobalamin (VITAMIN B-12) 1000 MCG SUBL Place 1 tablet (1,000 mcg total) under the tongue daily. 02/12/14   Plotnikov, Evie Lacks, MD  ergocalciferol (VITAMIN D2) 50000 units capsule Take 1 capsule (50,000 Units total) by mouth once a week. 01/03/16   Plotnikov, Evie Lacks, MD  meloxicam (MOBIC) 7.5 MG tablet Take 1-2 tablets (7.5-15 mg total) by mouth daily. 12/18/16   Plotnikov, Evie Lacks, MD    Family History Family History  Problem Relation Age of Onset  . Alzheimer's disease Father   . Parkinsonism Father   . Arthritis Mother   . Goiter Mother   . Hypertension Other     Social History Social History  Substance Use Topics  . Smoking status: Light Tobacco Smoker    Packs/day: 0.07    Types: Cigarettes  . Smokeless tobacco: Never Used  . Alcohol use No     Allergies   Patient has no known allergies.   Review of Systems Review of Systems  Constitutional: Negative for chills and fever.  Respiratory: Positive for shortness of breath. Negative for cough.  Cardiovascular: Positive for chest pain. Negative for palpitations and leg swelling.  Gastrointestinal: Positive for nausea.       Burping  Musculoskeletal: Negative for back pain and neck pain.  Neurological: Negative for light-headedness and headaches.  All other systems reviewed and are negative.    Physical Exam Updated Vital Signs BP (!) 140/102   Pulse 93   Resp 15   Ht 5\' 7"  (1.702 m)   Wt 78 kg (172 lb)   SpO2 100%   BMI 26.94 kg/m   Physical Exam  Constitutional: He appears well-developed and well-nourished.  HENT:  Head: Normocephalic and atraumatic.  Mouth/Throat: Mucous membranes are dry.  Eyes: Conjunctivae are  normal.  Neck: Neck supple.  Cardiovascular: Normal rate and regular rhythm.   No murmur heard. Pulmonary/Chest: Effort normal and breath sounds normal. No respiratory distress.  Abdominal: Soft. There is no tenderness.  Musculoskeletal: He exhibits no edema.       Right lower leg: He exhibits no swelling and no edema.       Left lower leg: He exhibits no swelling and no edema.  Neurological: He is alert.  Skin: Skin is warm and dry.  Psychiatric: He has a normal mood and affect.  Nursing note and vitals reviewed.    ED Treatments / Results  Labs (all labs ordered are listed, but only abnormal results are displayed) Labs Reviewed  CBC WITH DIFFERENTIAL/PLATELET  COMPREHENSIVE METABOLIC PANEL  I-STAT TROPOININ, ED  I-STAT CG4 LACTIC ACID, ED    EKG  EKG Interpretation  Date/Time:  Saturday Feb 23 2017 15:06:32 EDT Ventricular Rate:  85 PR Interval:    QRS Duration: 142 QT Interval:  411 QTC Calculation: 489 R Axis:   71 Text Interpretation:  Sinus rhythm ** ** ACUTE MI / STEMI ** ** Confirmed by Ashok Cordia  MD, Lennette Bihari ([id number]) on 02/23/2017 3:09:05 PM       Radiology No results found.  Procedures Procedures (including critical care time)  Medications Ordered in ED Medications  nitroGLYCERIN (NITROSTAT) SL tablet 0.4 mg (not administered)  heparin ADULT infusion 100 units/mL (25000 units/240mL sodium chloride 0.45%) (not administered)  heparin injection 4,000 Units (4,000 Units Intravenous Given 02/23/17 1510)     Initial Impression / Assessment and Plan / ED Course  I have reviewed the triage vital signs and the nursing notes.  Pertinent labs & imaging results that were available during my care of the patient were reviewed by me and considered in my medical decision making (see chart for details).     STEMI on EKG. Here patient with minimal pain. Already got ASA 324. Getting nitro prn. Heparin bolus and gtt started. CXR WNL. Trop elevated. Going to cath  lab.  Final Clinical Impressions(s) / ED Diagnoses   Final diagnoses:  ST elevation myocardial infarction (STEMI), unspecified artery [facility])    New Prescriptions Current Discharge Medication List       Maryan Puls, MD 02/23/17 Leonides Schanz, MD 02/23/17 Randol Kern    Lajean Saver, MD 02/23/17 2217

## 2017-02-23 NOTE — H&P (Signed)
HISTORY AND PHYSICAL ADDENDUM    See Dr. Allison Quarry H&P this is exam on 13 YOM with no prior cardiac hx now with STEMI of LAD.      ROS: General:no colds or fevers, no weight changes Skin:no rashes or ulcers HEENT:no blurred vision, no congestion CV:see HPI PUL:see HPI GI:no diarrhea constipation or melena, no indigestion GU:no hematuria, no dysuria MS:no joint pain, no claudication Neuro:no syncope, no lightheadedness Endo:no diabetes, no thyroid disease   BP 141/95  P  86  Resp 18 sp02 was 100%.  General:Pleasant affect, NAD, now though on arrival to cath lab pain 3/10 Skin:Warm and dry, brisk capillary refill HEENT:normocephalic, sclera clear, mucus membranes moist Neck:supple, no JVD, no bruits  Heart:S1S2 RRR without murmur, gallup, rub or click Lungs:clear without rales, rhonchi, or wheezes IVH:OYWV, non tender, + BS, do not palpate liver spleen or masses Ext:no lower ext edema, 2+ pedal pulses, 2+ radial pulses Neuro:alert and oriented X 3, MAE, follows commands, + facial symmetry     Principal Problem:   Acute ST elevation myocardial infarction (STEMI) involving left anterior descending (LAD) coronary artery (HCC) Active Problems:   TOBACCO USER   Ischemic cardiomyopathy  STEMI of  LAD on aggrastat  --ASA, brilinta on low dose BB  -- initial troponin 1.18, serial troponins. -- Will check lipids,   --cardiac rehab.  CAD see above  ICM with EF 35-40% on BB will add ACE once BP is higher.  Here in CCU 106/79.  HLD add lipitor 80 and check Lipids.his LD in March was 140.  HDL 44   Tobacco use add Nicoderm.

## 2017-02-23 NOTE — ED Triage Notes (Signed)
Pt was mowing grass at 11am and had a sudden onset of substernal chest pain. Pt went to an urgent care and was transported here as a code stemi due to ST elevation. Pt was given 324mg  ASA and 1 SL nitro tablet. Pt arrives to the ED in 2/10 substernal chest pain.

## 2017-02-23 NOTE — H&P (Addendum)
History and Physical Interval Note:  NAME:  Blake [name]   MRN: [id number] DOB:  1956-09-19   ADMIT DATE: 02/23/2017   02/23/2017 3:30 PM  Blake [name] is a 61 y.o. male without any significant past medical history he was in his usual state of health until roughly 11:30 this morning after mowing his lawn. He then started having an aching sensation across his lower chest that started to get worse. He went to a local urgent care and was found to have abnormal EKG findings concerning for anterior STEMI. He EMS was contacted and he was transferred to [facility] were he now notes his chest pain to be notably improved down to maybe 5/10. He arrived at the emergency room upon completion of a initial emergency cardiac catheterization and therefore there was a 15-20 minute delay in the emergency room for the patient was brought to the cardiac catheterization lab. Upon arrival to the catheterization lab his pain is roughly 3/10.   Past Medical History:  Diagnosis Date  . Cancer [facility]) 2009   brain tumor  . LUMBAR RADICULOPATHY, RIGHT 07/02/2008   Qualifier: Diagnosis of  By: Alain Marion MD, Evie Lacks Memory loss 07/05/2008   Qualifier: Diagnosis of  By: Alain Marion MD, [place] 07/05/2008   Qualifier: Diagnosis of  By: Alain Marion MD, Evie Lacks Neoplasm of unspecified nature of brain 08/18/2008   Qualifier: Diagnosis of  By: Alain Marion MD, Evie Lacks Rash and other nonspecific skin eruption 08/18/2008   Qualifier: Diagnosis of  By: Doralee Albino    . TOBACCO USER 11/28/2009   Qualifier: Diagnosis of  By: Plotnikov MD, Evie Lacks   . VITAMIN D DEFICIENCY 07/02/2008   Qualifier: Diagnosis of  By: Plotnikov MD, Evie Lacks    Past Surgical History:  Procedure Laterality Date  . CRANIECTOMY / [place] TUMOR  2009   surgery and XRT at [facility]    FAMHx: Family History  Problem Relation Age of Onset  . Alzheimer's disease Father   . Parkinsonism  Father   . Arthritis Mother   . Goiter Mother   . Hypertension Other     SOCHx:  reports that he has been smoking Cigarettes.  He has been smoking about 0.07 packs per day. He has never used smokeless tobacco. He reports that he does not drink alcohol or use drugs.  ALLERGIES: No Known Allergies  HOME MEDICATIONS: Prescriptions Prior to Admission  Medication Sig Dispense Refill Last Dose  . Cholecalciferol (VITAMIN D3) 2000 units capsule Take 1 capsule (2,000 Units total) by mouth daily. 100 capsule 3 Taking  . Cyanocobalamin (VITAMIN B-12) 1000 MCG SUBL Place 1 tablet (1,000 mcg total) under the tongue daily. 100 tablet 3 Taking  . ergocalciferol (VITAMIN D2) 50000 units capsule Take 1 capsule (50,000 Units total) by mouth once a week. 6 capsule 0 Taking  . meloxicam (MOBIC) 7.5 MG tablet Take 1-2 tablets (7.5-15 mg total) by mouth daily. 60 tablet 3 Taking   ROS: General:no colds or fevers, no weight changes Skin:no rashes or ulcers HEENT:no blurred vision, no congestion CV:see HPI PUL:see HPI GI:no diarrhea constipation or melena, no indigestion GU:no hematuria, no dysuria MS:no joint pain, no claudication Neuro:no syncope, no lightheadedness Endo:no diabetes, no thyroid disease   PHYSICAL EXAM:Blood pressure (!) 141/95, pulse 86, resp. rate 18, height 5\' 7"  (1.702 m), weight 172 lb (78 kg), SpO2 100 %. General:Pleasant affect, NAD, now  though on arrival to cath lab pain 3/10 Skin:Warm and dry, brisk capillary refill HEENT:normocephalic, sclera clear, mucus membranes moist Neck:supple, no JVD, no bruits  Heart:S1S2 RRR without murmur, gallup, rub or click Lungs:clear without rales, rhonchi, or wheezes NWG:NFAO, non tender, + BS, do not palpate liver spleen or masses Ext:no lower ext edema, 2+ pedal pulses, 2+ radial pulses Neuro:alert and oriented X 3, MAE, follows commands, + facial symmetry    Adult ECG Report  Rate: 85 ;  Rhythm: normal sinus rhythm and RBBB - 5-6  mm STE in V2-V3 & 3-4 mm in V4-V5. No reciprocal changes Normal axis & intervals. --   Narrative Interpretation: Anterior STEMI - anteroseptal injury pattern.  IMPRESSION & PLAN The patients' history has been reviewed, patient examined, no change in status from most recent note, stable for surgery. I have reviewed the patients' chart and labs. Questions were answered to the patient's satisfaction.    Blake [name] has presented today for surgery, with the diagnosis of chest pain The various methods of treatment have been discussed with the patient and family.   Risks / Complications include, but not limited to: Death, MI, CVA/TIA, VF/VT (with defibrillation), Bradycardia (need for temporary pacer placement), contrast induced nephropathy, bleeding / bruising / hematoma / pseudoaneurysm, vascular or coronary injury (with possible emergent CT or Vascular Surgery), adverse medication reactions, infection.     After consideration of risks, benefits and other options for treatment, the patient has consented to Procedure(s):  LEFT HEART CATHETERIZATION AND CORONARY ANGIOGRAPHY +/- AD [place]  as a surgical intervention.   We will proceed with the planned procedure.   [facility] [address] [place], [place]  [address]  [phone]  02/23/2017 3:30 PM   ADDENDUM: noted in Italics added after cath. Procedures    Coronary/Graft Acute MI Revascularization   Coronary Stent Intervention   Left Heart Cath and Coronary Angiography  Conclusion    There is moderate to severe left ventricular systolic dysfunction.  The left ventricular ejection fraction is 35-45% by visual estimate.  LV end diastolic pressure is severely elevated.  Mid Cx lesion, 30 %stenosed.  -----CULPRIT LESION-------  Prox LAD to Mid LAD lesion, 100 %stenosed.  A STENT SYNERGY DES 3X24 drug eluting stent was successfully  placed.  Post intervention, there is a 0% residual stenosis.  Dist LAD lesion, 95 %stenosed - distal embolization of thrombus to the small caliber apical LAD.    Wall Motion               Diagnostic Diagram       Post-Intervention Diagram             He was stable post PCI to LAD Plan:  STEMI of  LAD on Aggrastat x 12 hr total   -- ASA, brilinta on low dose BB  -- initial troponin 1.18, serial troponins. -- Will check lipids,   -- cardiac rehab with Phase II referral  CAD-PCI see above (cath note)  ICM with EF 35-40% on BB will add ACE once BP is higher.  Here in CCU 106/79. - Given 1 dose of IV Lasix ~1 hr post cath for elevated LVEDP.  HLD add lipitor 80 and check Lipids.his LD in March was 140.  HDL 44   Tobacco use add Nicoderm.     Glenetta Hew, M.D., M.S. Interventional Cardiologist   Pager # [phone] Phone # [phone] [address] [place],  [place] [address]

## 2017-02-23 NOTE — ED Notes (Signed)
ED Provider at bedside. 

## 2017-02-23 NOTE — Progress Notes (Signed)
eLink Physician-Brief Progress Note Patient Name: Blake [name] DOB: 1955/12/27 MRN: [id number]   Date of Service  02/23/2017  HPI/Events of Note  61 yr old male presented with STEMI and is s/p PCI.  Presently stable from hemodynamic and resp standpoint.  Chart reviewed.  eICU Interventions  No recommendations at this time     Intervention Category Evaluation Type: New Patient Evaluation  Mauri Brooklyn, P 02/23/2017, 6:09 PM

## 2017-02-24 ENCOUNTER — Inpatient Hospital Stay (HOSPITAL_COMMUNITY): Payer: 59

## 2017-02-24 DIAGNOSIS — I5042 Chronic combined systolic (congestive) and diastolic (congestive) heart failure: Secondary | ICD-10-CM | POA: Diagnosis present

## 2017-02-24 DIAGNOSIS — I5021 Acute systolic (congestive) heart failure: Secondary | ICD-10-CM

## 2017-02-24 DIAGNOSIS — I255 Ischemic cardiomyopathy: Secondary | ICD-10-CM

## 2017-02-24 LAB — ECHOCARDIOGRAM COMPLETE
HEIGHTINCHES: 67 in
Weight: 2752 oz

## 2017-02-24 LAB — BASIC METABOLIC PANEL
ANION GAP: 8 (ref 5–15)
BUN: 8 mg/dL (ref 6–20)
CHLORIDE: 106 mmol/L (ref 101–111)
CO2: 22 mmol/L (ref 22–32)
Calcium: 8.4 mg/dL — ABNORMAL LOW (ref 8.9–10.3)
Creatinine, Ser: 0.8 mg/dL (ref 0.61–1.24)
GFR calc non Af Amer: >60 mL/min (ref >60)
Glucose, Bld: 131 mg/dL — ABNORMAL HIGH (ref 65–99)
POTASSIUM: 3.5 mmol/L (ref 3.5–5.1)
Sodium: 136 mmol/L (ref 135–145)

## 2017-02-24 LAB — CBC
HEMATOCRIT: 40.7 % (ref 39.0–52.0)
Hemoglobin: 13.5 g/dL (ref 13.0–17.0)
MCH: 28.1 pg (ref 26.0–34.0)
MCHC: 33.2 g/dL (ref 30.0–36.0)
MCV: 84.8 fL (ref 78.0–100.0)
Platelets: 247 10*3/uL (ref 150–400)
RBC: 4.8 MIL/uL (ref 4.22–5.81)
RDW: 13.7 % (ref 11.5–15.5)
WBC: 10.3 10*3/uL (ref 4.0–10.5)

## 2017-02-24 LAB — TROPONIN I: Troponin I: >65 ng/mL (ref <0.03)

## 2017-02-24 LAB — HEMOGLOBIN A1C
Hgb A1c MFr Bld: 5.7 % — ABNORMAL HIGH (ref 4.8–5.6)
MEAN PLASMA GLUCOSE: 117 mg/dL

## 2017-02-24 MED ORDER — CARVEDILOL 3.125 MG PO TABS
3.1250 mg | ORAL_TABLET | Freq: Two times a day (BID) | ORAL | Status: DC
  Administered 2017-02-24 – 2017-02-26 (×4): 3.125 mg via ORAL
  Filled 2017-02-24 (×4): qty 1

## 2017-02-24 MED ORDER — LISINOPRIL 2.5 MG PO TABS
2.5000 mg | ORAL_TABLET | Freq: Every day | ORAL | Status: DC
  Administered 2017-02-24: 2.5 mg via ORAL
  Filled 2017-02-24 (×2): qty 1

## 2017-02-24 MED ORDER — POTASSIUM CHLORIDE CRYS ER 20 MEQ PO TBCR
40.0000 meq | EXTENDED_RELEASE_TABLET | Freq: Once | ORAL | Status: AC
  Administered 2017-02-24: 40 meq via ORAL
  Filled 2017-02-24: qty 2

## 2017-02-24 MED ORDER — ATORVASTATIN CALCIUM 80 MG PO TABS
80.0000 mg | ORAL_TABLET | Freq: Every day | ORAL | Status: DC
  Administered 2017-02-24 – 2017-02-25 (×2): 80 mg via ORAL
  Filled 2017-02-24 (×2): qty 1

## 2017-02-24 NOTE — Progress Notes (Signed)
  Echocardiogram 2D Echocardiogram has been performed.  Donata Clay 02/24/2017, 3:57 PM

## 2017-02-24 NOTE — Progress Notes (Signed)
Progress Note  Patient Name: Blake [name] Date of Encounter: 02/24/2017  Primary Cardiologist: Glenetta Hew, M.D.  Subjective   Myocardial infarction pain felt like bad indigestion. He had had mild "indigestion" on rare occasions prior to the acute MI. He wants to go home as soon as possible because he is unable to get sleep here in the hospital. He smokes but does not have diabetes mellitus.  Inpatient Medications    Scheduled Meds: . aspirin  81 mg Oral Daily  . metoprolol tartrate  12.5 mg Oral BID  . nicotine  14 mg Transdermal Daily  . sodium chloride flush  3 mL Intravenous Q12H  . ticagrelor  90 mg Oral BID   Continuous Infusions: . sodium chloride     PRN Meds: sodium chloride, acetaminophen, morphine injection, ondansetron (ZOFRAN) IV, sodium chloride flush   Vital Signs    Vitals:   02/24/17 0800 02/24/17 0900 02/24/17 1000 02/24/17 1100  BP: 112/79 117/82 109/81 112/77  Pulse: 74 74 73 68  Resp: 16 15 14 13   Temp:      TempSrc:      SpO2: 97% 96% 98% 98%  Weight:      Height:        Intake/Output Summary (Last 24 hours) at 02/24/17 1120 Last data filed at 02/24/17 0953  Gross per 24 hour  Intake             1743 ml  Output             1450 ml  Net              293 ml   Filed Weights   02/23/17 1504  Weight: 172 lb (78 kg)    Telemetry    Sinus rhythm. Occasional PVCs and accelerated idioventricular rhythm. No VT or VF. - Personally Reviewed  ECG    Post intervention revealed resolution of ST elevation in the anterior leads, right bundle branch block (chronic), and otherwise unremarkable and compared to prior. - Personally Reviewed  Physical Exam  No distress, slightly overweight GEN: No acute distress.   Neck: No JVD Cardiac: RRR, no murmurs, rubs. There is a soft S4 gallop. The radial cath site is unremarkable. Pulses preserved. All other pulses are 2+ and equal Respiratory: Clear to auscultation bilaterally. GI: Soft, nontender,  non-distended  MS: No edema; No deformity. Neuro:  Nonfocal  Psych: Normal affect   Labs    Chemistry Recent Labs Lab 02/23/17 1506 02/23/17 1547 02/24/17 0443  NA 138 142 136  K 3.6 3.5 3.5  CL 107 108 106  CO2 21*  --  22  GLUCOSE 125* 125* 131*  BUN 12 11 8   CREATININE 0.99 0.60* 0.80  CALCIUM 9.0  --  8.4*  PROT 6.9  --   --   ALBUMIN 4.3  --   --   AST 51*  --   --   ALT 24  --   --   ALKPHOS 91  --   --   BILITOT 0.5  --   --   GFRNONAA >60  --  >60  GFRAA >60  --  >60  ANIONGAP 10  --  8     Hematology Recent Labs Lab 02/23/17 1506 02/23/17 1547 02/24/17 0443  WBC 13.5*  --  10.3  RBC 5.00  --  4.80  HGB 14.4 13.9 13.5  HCT 43.0 41.0 40.7  MCV 86.0  --  84.8  MCH 28.8  --  28.1  MCHC 33.5  --  33.2  RDW 13.6  --  13.7  PLT 266  --  247    Cardiac Enzymes Recent Labs Lab 02/23/17 1821 02/23/17 2244 02/24/17 0443  TROPONINI >65.00* >65.00* >65.00*    Recent Labs Lab 02/23/17 1511  TROPIPOC 1.18*    Hemoglobin A1c 5.7  BNPNo results for input(s): BNP, PROBNP in the last 168 hours.   DDimer No results for input(s): DDIMER in the last 168 hours.   Radiology    Dg Chest Port 1 View  Result Date: 02/23/2017 CLINICAL DATA:  Substernal chest pain today while mowing the grass, code STEMI, history smoking EXAM: PORTABLE CHEST 1 VIEW COMPARISON:  Portable exam 1516 hours compared to 12/12/2015 FINDINGS: Normal heart size, mediastinal contours, and pulmonary vascularity. Lungs clear. No pleural effusion or pneumothorax. Bones unremarkable. IMPRESSION: No acute abnormalities. Electronically Signed   By: Lavonia Dana M.D.   On: 02/23/2017 15:29    Cardiac Studies    Pre-PCI diagram:      Post-Intervention Diagram        Acute LVEF 35%  Patient Profile     61 y.o. male suffering anterior ST elevation myocardial infarction on 02/23/2017 after morning his yard. Underlying risk factors are tobacco abuse. Brain tumor resection 2009 at [facility], followed annually with MRI. At cath he was found to have a totally occluded proximal LAD that was treated with a drug-eluting stent, successfully. Acute LVEF 35%.  Assessment & Plan    1. Anterior ST elevation myocardial infarction successfully treated with proximal PCI and stenting of totally occluded LAD.Marland Kitchen No recurrent ischemia in the interval since the procedure was completed. Angiography did not demonstrate any significant additional coronary lesions.  2. Acute systolic heart failure with LVEF estimated to be 35% by acute angiogram. No current shortness of breath. On exam there is no evidence of S3 gallop or indication of severe LV dysfunction. ACE inhibitor and beta blocker therapy will be titrated as tolerated by blood pressure and heart rate. We will perform echo on Tuesday to reassess LV function. I suspect LVEF will be greater than 35%.  3. Hyperlipidemia, LDL 111 on admission  4. Tobacco abuse, we discussed smoking cessation and the importance of preventing recurrent events.  5. Hypokalemia, treated with a single oral dose  Signed, Sinclair Grooms, MD  02/24/2017, 11:20 AM

## 2017-02-24 NOTE — Progress Notes (Signed)
CRITICAL VALUE ALERT  Critical Value:  Troponin   Date & Time Notied:  1950 and 2330  Provider Notified: Dr. Ellyn Hack  Orders Received/Actions taken: none

## 2017-02-25 DIAGNOSIS — I5041 Acute combined systolic (congestive) and diastolic (congestive) heart failure: Secondary | ICD-10-CM

## 2017-02-25 DIAGNOSIS — F17200 Nicotine dependence, unspecified, uncomplicated: Secondary | ICD-10-CM

## 2017-02-25 LAB — CBC
HCT: 38.8 % — ABNORMAL LOW (ref 39.0–52.0)
Hemoglobin: 12.8 g/dL — ABNORMAL LOW (ref 13.0–17.0)
MCH: 28.1 pg (ref 26.0–34.0)
MCHC: 33 g/dL (ref 30.0–36.0)
MCV: 85.1 fL (ref 78.0–100.0)
PLATELETS: 220 10*3/uL (ref 150–400)
RBC: 4.56 MIL/uL (ref 4.22–5.81)
RDW: 13.7 % (ref 11.5–15.5)
WBC: 9.6 10*3/uL (ref 4.0–10.5)

## 2017-02-25 NOTE — Progress Notes (Signed)
Progress Note  Patient Name: Blake [name] Date of Encounter: 02/25/2017  Primary Cardiologist: Glenetta Hew, M.D.  Patient Profile     61 y.o. male suffering anterior ST elevation myocardial infarction on 02/23/2017 after morning his yard. Underlying risk factors are tobacco abuse. Brain tumor resection 2009 at [facility], followed annually with MRI. At cath he was found to have a totally occluded proximal LAD that was treated with a drug-eluting stent, successfully. Acute LVEF 35%. Myocardial infarction pain felt like bad indigestion. He had had mild "indigestion" on rare occasions prior to the acute MI.   He wants to go home as soon as possible because he is unable to get sleep here in the hospital.  Subjective   Feels well this AM.  No further Anginal Sx or Dyspnea.   Walking in hallways without problems  Inpatient Medications    Scheduled Meds: . aspirin  81 mg Oral Daily  . atorvastatin  80 mg Oral q1800  . carvedilol  3.125 mg Oral BID WC  . lisinopril  2.5 mg Oral Daily  . nicotine  14 mg Transdermal Daily  . sodium chloride flush  3 mL Intravenous Q12H  . ticagrelor  90 mg Oral BID   Continuous Infusions: . sodium chloride     PRN Meds: sodium chloride, acetaminophen, morphine injection, ondansetron (ZOFRAN) IV, sodium chloride flush   Vital Signs    Vitals:   02/24/17 2026 02/25/17 0027 02/25/17 0100 02/25/17 0536  BP: (!) 98/59 (!) 89/46 (!) 96/52 100/72  Pulse: 81 74  77  Resp: 17 16  16   Temp: 99.2 F (37.3 C) 98.7 F (37.1 C)  98.9 F (37.2 C)  TempSrc: Oral Oral  Oral  SpO2: 97% 98%  97%  Weight:    169 lb 12.8 oz (77 kg)  Height:        Intake/Output Summary (Last 24 hours) at 02/25/17 0728 Last data filed at 02/25/17 0536  Gross per 24 hour  Intake                3 ml  Output             1900 ml  Net            -1897 ml   Filed Weights   02/23/17 1504 02/25/17 0536  Weight: 172 lb (78 kg) 169 lb 12.8 oz (77 kg)     Telemetry    NSR 60s-80s. Occasional PVCs. No further AIVR. Not VF/VT. - Personally Reviewed  ECG    n/a- Personally Reviewed  Physical Exam   Physical Exam  Constitutional: He is oriented to person, place, and time. He appears well-developed and well-nourished. No distress.  HENT:  Head: Normocephalic and atraumatic.  Eyes: Conjunctivae and EOM are normal. Pupils are equal, round, and reactive to light.  Neck: Normal range of motion. Neck supple. No JVD present. No thyromegaly present.  Cardiovascular: Normal rate, regular rhythm, S1 normal, S2 normal and intact distal pulses.  PMI is not displaced.  Exam reveals gallop. Exam reveals no friction rub.   No murmur heard. Pulmonary/Chest: Effort normal and breath sounds normal. No respiratory distress. He has no wheezes. He has no rales. He exhibits no tenderness.  Abdominal: Soft. Bowel sounds are normal. He exhibits no distension and no mass. There is no tenderness. There is no rebound and no guarding.  Musculoskeletal: He exhibits no edema.  Neurological: He is alert and oriented to person, place, and time.  Skin: Skin is warm and dry. No rash noted. No erythema.  Psychiatric: He has a normal mood and affect. His behavior is normal. Judgment and thought content normal.    Labs    Chemistry  Recent Labs Lab 02/23/17 1506 02/23/17 1547 02/24/17 0443  NA 138 142 136  K 3.6 3.5 3.5  CL 107 108 106  CO2 21*  --  22  GLUCOSE 125* 125* 131*  BUN 12 11 8   CREATININE 0.99 0.60* 0.80  CALCIUM 9.0  --  8.4*  PROT 6.9  --   --   ALBUMIN 4.3  --   --   AST 51*  --   --   ALT 24  --   --   ALKPHOS 91  --   --   BILITOT 0.5  --   --   GFRNONAA >60  --  >60  GFRAA >60  --  >60  ANIONGAP 10  --  8     Hematology  Recent Labs Lab 02/23/17 1506 02/23/17 1547 02/24/17 0443 02/25/17 0425  WBC 13.5*  --  10.3 9.6  RBC 5.00  --  4.80 4.56  HGB 14.4 13.9 13.5 12.8*  HCT 43.0 41.0 40.7 38.8*  MCV 86.0  --  84.8 85.1   MCH 28.8  --  28.1 28.1  MCHC 33.5  --  33.2 33.0  RDW 13.6  --  13.7 13.7  PLT 266  --  247 220    Cardiac Enzymes  Recent Labs Lab 02/23/17 1821 02/23/17 2244 02/24/17 0443  TROPONINI >65.00* >65.00* >65.00*     Recent Labs Lab 02/23/17 1511  TROPIPOC 1.18*    Hemoglobin A1c 5.7  BNPNo results for input(s): BNP, PROBNP in the last 168 hours.   DDimer No results for input(s): DDIMER in the last 168 hours.   Radiology    Dg Chest Port 1 View  Result Date: 02/23/2017 CLINICAL DATA:  Substernal chest pain today while mowing the grass, code STEMI, history smoking EXAM: PORTABLE CHEST 1 VIEW COMPARISON:  Portable exam 1516 hours compared to 12/12/2015 FINDINGS: Normal heart size, mediastinal contours, and pulmonary vascularity. Lungs clear. No pleural effusion or pneumothorax. Bones unremarkable. IMPRESSION: No acute abnormalities. Electronically Signed   By: Lavonia Dana M.D.   On: 02/23/2017 15:29    Cardiac Studies   Cath-PCI 5/26 Pre-PCI diagram:     Post-Intervention Diagram     There is moderate to severe left ventricular systolic dysfunction.  The left ventricular ejection fraction is 35-45% by visual estimate.  LV end diastolic pressure is severely elevated.  Mid Cx lesion, 30 %stenosed.  -----CULPRIT LESION-------  Prox LAD to Mid LAD lesion, 100 %stenosed.  A STENT SYNERGY DES 3X24 drug eluting stent was successfully placed.  Post intervention, there is a 0% residual stenosis.  Dist LAD lesion, 95 %stenosed - distal embolization of thrombus to the small caliber apical LAD.   2D Echo 5/27: EF 30-35% with LAD territory hypokinesis / akinesis - akinesis of mid anterior, anteroseptal, inferoseptal and apical anterior, septal, inferior & apical walls.  Gr 1DD. IVC Dilated - elevated CVP.   Assessment & Plan   Principal Problem:   Acute ST elevation myocardial infarction (STEMI) involving left anterior descending (LAD) coronary artery (HCC) Active  Problems:   Ischemic cardiomyopathy   Acute combined systolic and diastolic heart failure (HCC)   TOBACCO USER  1. Anterior ST elevation myocardial infarction - DES PCI of ~mLAD after Diag1.  NO other notable  lesions.  ASA/Brilinta (CM consulted)  High dose Statin  BB stared post-cath & ACE-I added yesterday -- no room to titrate with current BP .  [facility] consulted    2. Ischemic Cardiomyopathy /Acute combined systolic & diastolic heart failure - EF ~35% by angiogram - confirmed by Echo with LAD distribution severe HK-AK. ==> appears euvolemic on exam. No current shortness of breath. Soft S4 but no S3.  Per Dr. Tamala Julian, was planning Echo on Tuesday to assess -will order limited echo to assess EF in order to determine whether or not we would recommend LifeVest.  Dr. Meda Coffee (reading MD) recommended repeat echo in ~6 weeks.   Not really able to titrate BB & ACE-I up due to borderline BPs. - I will DC ACE inhibitor today based on his blood pressure still being in the 80s. Continue carvedilol with hold parameters.  Plan be to actually start ACE inhibitor/ARB as an outpatient on initial follow-up.   3. Hyperlipidemia, LDL 111 on admission  On high dose atorvastatin  4. Tobacco abuse: Smoking cessation instruction/counseling given:  counseled patient on the dangers of tobacco use, advised patient to stop smoking, and reviewed strategies to maximize success  5. Hypokalemia - resolved after repleting levels  Not a good Fast-Track Candidate with borderline BPs & significant Cardiomyopathy. If able to get LifeVest arranged, anticipate d/c tomorrow. Will need close f/u - preferably with Almyra Deforest, PA   Signed, Glenetta Hew, MD  02/25/2017, 7:28 AM

## 2017-02-26 ENCOUNTER — Inpatient Hospital Stay (HOSPITAL_COMMUNITY): Payer: 59

## 2017-02-26 ENCOUNTER — Other Ambulatory Visit (HOSPITAL_COMMUNITY): Payer: 59

## 2017-02-26 ENCOUNTER — Encounter (HOSPITAL_COMMUNITY): Payer: Self-pay | Admitting: Cardiology

## 2017-02-26 ENCOUNTER — Telehealth: Payer: Self-pay | Admitting: Cardiology

## 2017-02-26 DIAGNOSIS — I501 Left ventricular failure: Secondary | ICD-10-CM

## 2017-02-26 DIAGNOSIS — E785 Hyperlipidemia, unspecified: Secondary | ICD-10-CM

## 2017-02-26 LAB — TROPONIN I: TROPONIN I: 51.34 ng/mL — AB (ref <0.03)

## 2017-02-26 LAB — CBC
HCT: 40.6 % (ref 39.0–52.0)
Hemoglobin: 13.6 g/dL (ref 13.0–17.0)
MCH: 28.6 pg (ref 26.0–34.0)
MCHC: 33.5 g/dL (ref 30.0–36.0)
MCV: 85.5 fL (ref 78.0–100.0)
PLATELETS: 211 10*3/uL (ref 150–400)
RBC: 4.75 MIL/uL (ref 4.22–5.81)
RDW: 13.7 % (ref 11.5–15.5)
WBC: 9.3 10*3/uL (ref 4.0–10.5)

## 2017-02-26 LAB — ECHOCARDIOGRAM LIMITED
HEIGHTINCHES: 67 in
WEIGHTICAEL: 2726.4 [oz_av]

## 2017-02-26 LAB — BASIC METABOLIC PANEL
Anion gap: 8 (ref 5–15)
BUN: 17 mg/dL (ref 6–20)
CO2: 20 mmol/L — ABNORMAL LOW (ref 22–32)
Calcium: 8.7 mg/dL — ABNORMAL LOW (ref 8.9–10.3)
Chloride: 110 mmol/L (ref 101–111)
Creatinine, Ser: 0.92 mg/dL (ref 0.61–1.24)
GFR calc Af Amer: >60 mL/min (ref >60)
GFR calc non Af Amer: >60 mL/min (ref >60)
Glucose, Bld: 112 mg/dL — ABNORMAL HIGH (ref 65–99)
Potassium: 4 mmol/L (ref 3.5–5.1)
Sodium: 138 mmol/L (ref 135–145)

## 2017-02-26 MED ORDER — NITROGLYCERIN 0.4 MG SL SUBL: 0.4000 mg | SUBLINGUAL_TABLET | SUBLINGUAL | 3 refills | Status: DC | PRN

## 2017-02-26 MED ORDER — ASPIRIN 81 MG PO CHEW: 81.0000 mg | CHEWABLE_TABLET | Freq: Every day | ORAL | Status: DC

## 2017-02-26 MED ORDER — TICAGRELOR 90 MG PO TABS: 90.0000 mg | ORAL_TABLET | Freq: Two times a day (BID) | ORAL | 0 refills | Status: DC

## 2017-02-26 MED ORDER — CARVEDILOL 3.125 MG PO TABS: 3.1250 mg | ORAL_TABLET | Freq: Two times a day (BID) | ORAL | 3 refills | Status: DC

## 2017-02-26 MED ORDER — ATORVASTATIN CALCIUM 80 MG PO TABS: 80.0000 mg | ORAL_TABLET | Freq: Every day | ORAL | 6 refills | Status: DC

## 2017-02-26 MED ORDER — TICAGRELOR 90 MG PO TABS: 90.0000 mg | ORAL_TABLET | Freq: Two times a day (BID) | ORAL | 10 refills | Status: DC

## 2017-02-26 NOTE — Progress Notes (Signed)
CARDIAC REHAB PHASE I   PRE:  Rate/Rhythm: 70 SR  BP:  Supine:   Sitting: 105/75  Standing:    SaO2: 98%RA  MODE:  Ambulation: 550 ft   POST:  Rate/Rhythm: 80 SR  BP:  Supine:   Sitting: 113/86  Standing:    SaO2: 100%RA 1045-1135 Pt walked 550 ft with steady gait and no CP. Tolerated well. MI and CHF ed done with pt and son who voiced understanding. Gave CHF booklet and discussed when to notify MD. Discussed the importance of daily weights and 2000 mg sodium restriction. Gave low sodium handouts. Reviewed NTG use, MI restrictions, ex ed, risk factors and smoking cessation. Gave fake cigarette and smoking cessation handout. Pt has quit briefly before. Discussed CRP 2 and referring to [facility] program. Awaiting ECHO results.   Graylon Good, RN BSN  02/26/2017 11:32 AM

## 2017-02-26 NOTE — Discharge Instructions (Signed)
Heart Attack °A heart attack (myocardial infarction, MI) causes damage to your heart that cannot be fixed. A heart attack can happen when a heart (coronary) artery becomes blocked or narrowed. This cuts off the blood supply and oxygen to your heart. °When one or more of your coronary arteries becomes blocked, that area of your heart begins to die. This causes the pain you feel during a heart attack. Heart attack pain can also occur in one part of the body but be felt in another part of the body (referred pain). You may feel referred heart attack pain in your left arm, neck, or jaw. Pain may even be felt in the right arm. °What are the causes? °Many conditions can cause a heart attack. These include: °· Atherosclerosis. This is when a fatty substance (plaque) gradually builds up in the arteries. This buildup can block or reduce the blood supply to one or more of the heart arteries. °· A blood clot. A blood clot can develop suddenly when plaque breaks up (ruptures) within a heart artery. A blood clot can block the heart artery, which prevents blood flow to the heart. °· Severe tightening (spasm) of the heart artery. This cuts off blood flow through the artery. °What increases the risk? °  °People at risk for heart attack usually have one or more of the following risk factors: °· High blood pressure (hypertension). °· High cholesterol. °· Smoking. °· Being male. °· Being overweight or obese. °· Older aged. °· A family history of heart disease. °· Lack of exercise. °· Diabetes. °· Stress. °· Drinking too much alcohol. °· Using illegal street drugs, such as cocaine and methamphetamines. °What are the signs or symptoms? °Heart attack symptoms can vary from person to person. Symptoms depend on factors like gender and age. °· In both men and women, heart attack symptoms can include the following: °¨ Chest pain. This may feel like crushing, squeezing, or a feeling of pressure. °¨ Shortness of breath. °¨ Heartburn or  indigestion with or without vomiting, shortness of breath, or sweating. °¨ Sudden cold sweats. °¨ Sudden light-headedness. °¨ Upper back pain. °· Women can have unique heart attack symptoms, such as: °¨ Unexplained feelings of nervousness or anxiety. °¨ Discomfort between the shoulder blades or upper back. °¨ Tingling in the hands and arms. °· Older people (of both genders) can have subtle heart attack symptoms, such as: °¨ Sweating. °¨ Shortness of breath. °¨ General tiredness or not feeling well. °How is this diagnosed? °Diagnosing a heart attack involves several tests. They include: °· An assessment of your vital signs. This includes checking your: °¨ Heart rhythm. °¨ Blood pressure. °¨ Breathing rate. °¨ Oxygen level. °· An ECG (electrocardiogram) to measure the electrical activity of your heart. °· Blood tests called cardiac markers. In these tests, blood is drawn at scheduled times to check for the specific proteins or enzymes released by damaged heart muscle. °· A chest X-ray. °· An echocardiogram to evaluate heart motion and blood flow. °· Coronary angiography to look at the heart arteries. °How is this treated? °Treatment for a heart attack may include: °· Medicine that breaks up or dissolves blood clots in the heart artery. °· Angioplasty. °· Cardiac stent placement. °· Intra-aortic balloon pump therapy (IABP). °· Open heart surgery (coronary artery bypass graft, CABG). °Follow these instructions at home: °· Take medicines only as directed by your health care provider. You may need to take medicine after a heart attack to: °¨ Keep your blood from clotting   too easily. °¨ Control your blood pressure. °¨ Lower your cholesterol. °¨ Control abnormal heart rhythms. °· Do not take the following medicines unless your health care provider approves: °¨ Nonsteroidal anti-inflammatory drugs (NSAIDs), such as ibuprofen, naproxen, or celecoxib. °¨ Vitamin supplements that contain vitamin A, vitamin E, or  both. °¨ Hormone replacement therapy that contains estrogen with or without progestin. °· Make lifestyle changes as directed by your health care provider. These may include: °¨ Quitting smoking, if you smoke. °¨ Getting regular exercise. Ask your health care provider to suggest some activities that are safe for you. °¨ Eating a heart-healthy diet. A registered dietitian can help you learn healthy eating options. °¨ Maintaining a healthy weight. °¨ Managing diabetes, if necessary. °¨ Reducing stress. °¨ Limiting how much alcohol you drink. °Get help right away if: °· You have sudden, unexplained chest discomfort. °· You have sudden, unexplained discomfort in your arms, back, neck, or jaw. °· You have shortness of breath at any time. °· You suddenly start to sweat or your skin gets clammy. °· You feel nauseous or vomit. °· You suddenly feel light-headed or dizzy. °· Your heart begins to beat fast or feels like it is skipping beats. °These symptoms may represent a serious problem that is an emergency. Do not wait to see if the symptoms will go away. Get medical help right away. Call your local emergency services (911 in the [place] Do not drive yourself to the hospital.  °This information is not intended to replace advice given to you by your health care provider. Make sure you discuss any questions you have with your health care provider. °Document Released: 09/17/2005 Document Revised: 02/23/2016 Document Reviewed: 11/20/2013 °Elsevier Interactive Patient Education © 2017 [employer] ° °

## 2017-02-26 NOTE — Progress Notes (Signed)
Pt & son educated on the importance of tobacco cessation, f/u appointments, meds, & site care. Pt educated on radial site care as well. The discharge packet was given to pt. All questions were answered & teaching was verified via teach back method. Pt was sure to have all of his belongings. Pt's IV & telemetry box were removed. CCMD was notified of pt being d/c'd. Hoover Brunette, RN

## 2017-02-26 NOTE — Plan of Care (Signed)
Problem: Education: Goal: Understanding of cardiac disease, CV risk reduction, and recovery process will improve Outcome: Completed/Met Date Met: 02/26/17 Pt educated on the importance of tobacco cessation. Pt educated on maintaining his bp, & cholesterol. Pt educated on SL nitro for cp & when to cal the MD. Pt as well as son educated on his radial site care.

## 2017-02-26 NOTE — Discharge Summary (Signed)
Discharge Summary    Patient ID: Blake [name],  MRN: [id number], DOB/AGE: 03-08-1956 61 y.o.  Admit date: 02/23/2017 Discharge date: 02/26/2017  Primary Care Provider: Cassandria Anger Primary Cardiologist: Ellyn Hack   Discharge Diagnoses    Principal Problem:   Acute ST elevation myocardial infarction (STEMI) involving left anterior descending (LAD) coronary artery [facility]) Active Problems:   Ischemic cardiomyopathy   Acute combined systolic and diastolic heart failure ([place])   TOBACCO USER   Hyperlipidemia   Allergies No Known Allergies  Diagnostic Studies/Procedures    Cath-PCI 5/26 Pre-PCI diagram:                                                       Post-Intervention Diagram     There is moderate to severe left ventricular systolic dysfunction.  The left ventricular ejection fraction is 35-45% by visual estimate.  LV end diastolic pressure is severely elevated.  Mid Cx lesion, 30 %stenosed.  -----CULPRIT LESION-------  Prox LAD to Mid LAD lesion, 100 %stenosed.  A STENT SYNERGY DES 3X24 drug eluting stent was successfully placed.  Post intervention, there is a 0% residual stenosis.  Dist LAD lesion, 95 %stenosed - distal embolization of thrombus to the small caliber apical LAD.   2D Echo 5/27: EF 30-35% with LAD territory hypokinesis / akinesis - akinesis of mid anterior, anteroseptal, inferoseptal and apical anterior, septal, inferior & apical walls.  Gr 1DD. IVC Dilated - elevated CVP. _____________   History of Present Illness     Blake [name] is a 61 y.o. male without any significant past medical history he was in his usual state of health until roughly 11:30 the morning of admission after mowing his lawn. He then started having an aching sensation across his lower chest that started to get worse. He went to a local urgent care and was found to have abnormal EKG findings concerning for anterior STEMI. EMS was contacted and he was transferred to  [facility] were he reported his chest pain to be notably improved down to maybe 5/10. He arrived at the emergency room upon completion of a initial emergency cardiac catheterization and therefore there was a 15-20 minute delay in the emergency room before the patient was brought to the cardiac catheterization lab. Upon arrival to the catheterization lab his pain was roughly 3/10.  Hospital Course     Underwent cardiac catheterization with Dr. Ellyn Hack noted above with DES to the occluded LAD. Placed on DAPT with ASA/Brilinta, along with statin and BB. LVEF was noted at 35% post cath. His volume status was noted to be stable. LDL 111, Hgb A1c 5.7. Trop peaked at >65 with downward trend prior to discharge. He was transferred to telemetry on 02/24/17. Worked well with cardiac rehab. Repeat limited echo on 02/26/17 showed improved LV function to 40-45%, therefore did not need a Lifevest prior to discharge. Considered the addition of ACEi/ARB this admission but his blood pressure remained soft. Consider at outpatient follow up appt.   He was seen by Dr. Angelena Form and determined stable for discharge. Follow up in the office has been arranged. Medications are listed below.  _____________  Discharge Vitals Blood pressure 102/81, pulse 75, temperature 98.4 F (36.9 C), temperature source Oral, resp. rate 14, height 5\' 7"  (1.702 m), weight 170 lb 6.4 oz (77.3  kg), SpO2 99 %.  Filed Weights   02/23/17 1504 02/25/17 0536 02/26/17 0425  Weight: 172 lb (78 kg) 169 lb 12.8 oz (77 kg) 170 lb 6.4 oz (77.3 kg)    Labs & Radiologic Studies    CBC  Recent Labs  02/23/17 1506  02/25/17 0425 02/26/17 0435  WBC 13.5*  < > 9.6 9.3  NEUTROABS 11.3*  --   --   --   HGB 14.4  < > 12.8* 13.6  HCT 43.0  < > 38.8* 40.6  MCV 86.0  < > 85.1 85.5  PLT 266  < > 220 211  < > = values in this interval not displayed. Basic Metabolic Panel  Recent Labs  02/23/17 1821 02/24/17 0443 02/26/17 0435  NA  --  136  138  K  --  3.5 4.0  CL  --  106 110  CO2  --  22 20*  GLUCOSE  --  131* 112*  BUN  --  8 17  CREATININE  --  0.80 0.92  CALCIUM  --  8.4* 8.7*  MG 1.9  --   --    Liver Function Tests  Recent Labs  02/23/17 1506  AST 51*  ALT 24  ALKPHOS 91  BILITOT 0.5  PROT 6.9  ALBUMIN 4.3   No results for input(s): LIPASE, AMYLASE in the last 72 hours. Cardiac Enzymes  Recent Labs  02/23/17 2244 02/24/17 0443 02/26/17 0435  TROPONINI >65.00* >65.00* 51.34*   BNP Invalid input(s): POCBNP D-Dimer No results for input(s): DDIMER in the last 72 hours. Hemoglobin A1C  Recent Labs  02/23/17 1821  HGBA1C 5.7*   Fasting Lipid Panel  Recent Labs  02/23/17 1821  CHOL 167  HDL 42  LDLCALC 111*  TRIG 72  CHOLHDL 4.0   Thyroid Function Tests  Recent Labs  02/23/17 1821  TSH 0.521   _____________  Dg Chest Port 1 View  Result Date: 02/23/2017 CLINICAL DATA:  Substernal chest pain today while mowing the grass, code STEMI, history smoking EXAM: PORTABLE CHEST 1 VIEW COMPARISON:  Portable exam 1516 hours compared to 12/12/2015 FINDINGS: Normal heart size, mediastinal contours, and pulmonary vascularity. Lungs clear. No pleural effusion or pneumothorax. Bones unremarkable. IMPRESSION: No acute abnormalities. Electronically Signed   By: Lavonia Dana M.D.   On: 02/23/2017 15:29   Disposition   Pt is being discharged home today in good condition.  Follow-up Plans & Appointments    Follow-up Information    Erma Heritage, PA-C Follow up on 03/06/2017.   Specialties:  Physician Assistant, Cardiology Why:  at [facility] for your follow up appt. Please arrive by 8:45am to check in.  Contact information: [address] [place] [address] [phone]          Discharge Instructions    (HEART FAILURE PATIENTS) Call MD:  Anytime you have any of the following symptoms: 1) 3 pound weight gain in 24 hours or 5 pounds in 1 week 2) shortness of breath, with or  without a dry hacking cough 3) swelling in the hands, feet or stomach 4) if you have to sleep on extra pillows at night in order to breathe.    Complete by:  As directed    Amb Referral to Cardiac Rehabilitation    Complete by:  As directed    Diagnosis:   Coronary Stents STEMI     Call MD for:  redness, tenderness, or signs of infection (pain, swelling, redness, odor or  green/yellow discharge around incision site)    Complete by:  As directed    Diet - low sodium heart healthy    Complete by:  As directed    Discharge instructions    Complete by:  As directed    Radial Site Care Refer to this sheet in the next few weeks. These instructions provide you with information on caring for yourself after your procedure. Your caregiver may also give you more specific instructions. Your treatment has been planned according to current medical practices, but problems sometimes occur. Call your caregiver if you have any problems or questions after your procedure. HOME CARE INSTRUCTIONS You may shower the day after the procedure.Remove the bandage (dressing) and gently wash the site with plain soap and water.Gently pat the site dry.  Do not apply powder or lotion to the site.  Do not submerge the affected site in water for 3 to 5 days.  Inspect the site at least twice daily.  Do not flex or bend the affected arm for 24 hours.  No lifting over 5 pounds (2.3 kg) for 5 days after your procedure.  Do not drive home if you are discharged the same day of the procedure. Have someone else drive you.  You may drive 24 hours after the procedure unless otherwise instructed by your caregiver.  What to expect: Any bruising will usually fade within 1 to 2 weeks.  Blood that collects in the tissue (hematoma) may be painful to the touch. It should usually decrease in size and tenderness within 1 to 2 weeks.  SEEK IMMEDIATE MEDICAL CARE IF: You have unusual pain at the radial site.  You have redness, warmth,  swelling, or pain at the radial site.  You have drainage (other than a small amount of blood on the dressing).  You have chills.  You have a fever or persistent symptoms for more than 72 hours.  You have a fever and your symptoms suddenly get worse.  Your arm becomes pale, cool, tingly, or numb.  You have heavy bleeding from the site. Hold pressure on the site.    Please keep a log of your blood pressures at home to bring back to your follow up appt. Make sure not to miss any doses of your Brilinta. Please give our office a call if you have any questions.   Increase activity slowly    Complete by:  As directed       Discharge Medications   Current Discharge Medication List    START taking these medications   Details  aspirin 81 MG chewable tablet Chew 1 tablet (81 mg total) by mouth daily.    atorvastatin (LIPITOR) 80 MG tablet Take 1 tablet (80 mg total) by mouth daily at 6 PM. Qty: 30 tablet, Refills: 6    carvedilol (COREG) 3.125 MG tablet Take 1 tablet (3.125 mg total) by mouth 2 (two) times daily with a meal. Qty: 60 tablet, Refills: 3    nitroGLYCERIN (NITROSTAT) 0.4 MG SL tablet Place 1 tablet (0.4 mg total) under the tongue every 5 (five) minutes as needed. Qty: 25 tablet, Refills: 3    ticagrelor (BRILINTA) 90 MG TABS tablet Take 1 tablet (90 mg total) by mouth 2 (two) times daily. Qty: 60 tablet, Refills: 0      CONTINUE these medications which have NOT CHANGED   Details  Cholecalciferol (VITAMIN D3) 2000 units capsule Take 1 capsule (2,000 Units total) by mouth daily. Qty: 100 capsule, Refills: 3  Cyanocobalamin (VITAMIN B-12) 1000 MCG SUBL Place 1 tablet (1,000 mcg total) under the tongue daily. Qty: 100 tablet, Refills: 3      STOP taking these medications     meloxicam (MOBIC) 7.5 MG tablet      ergocalciferol (VITAMIN D2) 50000 units capsule          Aspirin prescribed at discharge?  Yes High Intensity Statin Prescribed? (Lipitor 40-80mg  or  Crestor 20-40mg ): Yes Beta Blocker Prescribed? Yes For EF <40%, was ACEI/ARB Prescribed? No: Blood pressures to soft ADP Receptor Inhibitor Prescribed? (i.e. Plavix etc.-Includes Medically Managed Patients): Yes For EF <40%, Aldosterone Inhibitor Prescribed? No: EF improved Was EF assessed during THIS hospitalization? Yes Was Cardiac Rehab II ordered? (Included Medically managed Patients): Yes   Outstanding Labs/Studies   FLP/LFTs in 6 weeks if tolerating statin.   Duration of Discharge Encounter   Greater than 30 minutes including physician time.  Signed, Reino Bellis NP-C 02/26/2017, 12:20 PM   Echo shows improvement in LV function. No Lifevest indicated. D/c home today. See my full note this am.  Lauree Chandler 02/26/2017 1:41 PM

## 2017-02-26 NOTE — Progress Notes (Signed)
  Echocardiogram 2D Echocardiogram has been performed.  Matilde Bash 02/26/2017, 9:34 AM

## 2017-02-26 NOTE — Progress Notes (Signed)
Progress Note  Patient Name: Blake [name] Date of Encounter: 02/26/2017  Primary Cardiologist: Glenetta Hew, M.D.  Subjective   No chest pain or dyspnea.   ROS otherwise negative.   Inpatient Medications    Scheduled Meds: . aspirin  81 mg Oral Daily  . atorvastatin  80 mg Oral q1800  . carvedilol  3.125 mg Oral BID WC  . nicotine  14 mg Transdermal Daily  . sodium chloride flush  3 mL Intravenous Q12H  . ticagrelor  90 mg Oral BID   Continuous Infusions: . sodium chloride     PRN Meds: sodium chloride, acetaminophen, morphine injection, ondansetron (ZOFRAN) IV, sodium chloride flush   Vital Signs    Vitals:   02/25/17 1709 02/25/17 2100 02/26/17 0425 02/26/17 0816  BP: 100/72 98/60 99/70  102/81  Pulse: 75 74 75   Resp:  18 14   Temp:  97.9 F (36.6 C) 98.4 F (36.9 C)   TempSrc:  Oral Oral   SpO2:  99% 99%   Weight:   170 lb 6.4 oz (77.3 kg)   Height:        Intake/Output Summary (Last 24 hours) at 02/26/17 7782 Last data filed at 02/25/17 1700  Gross per 24 hour  Intake              840 ml  Output                0 ml  Net              840 ml   Filed Weights   02/23/17 1504 02/25/17 0536 02/26/17 0425  Weight: 172 lb (78 kg) 169 lb 12.8 oz (77 kg) 170 lb 6.4 oz (77.3 kg)    Telemetry     - Personally Reviewed  ECG    n/a- Personally Reviewed  Physical Exam   Physical Exam  Constitutional: He is oriented to person, place, and time. He appears well-developed and well-nourished. No distress.  HENT:  Head: Normocephalic.  Eyes: Conjunctivae and EOM are normal. Pupils are equal, round, and reactive to light.  Neck: Normal range of motion. Neck supple. No JVD present. No thyromegaly present.  Cardiovascular: Normal rate, regular rhythm, S1 normal, S2 normal and intact distal pulses.  PMI is not displaced.  Exam reveals gallop. Exam reveals no friction rub.   No murmur heard. Pulmonary/Chest: Effort normal and breath sounds normal. No  respiratory distress. He has no wheezes. He has no rales. He exhibits no tenderness.  Abdominal: Soft. Bowel sounds are normal. He exhibits no distension and no mass. There is no tenderness. There is no rebound and no guarding.  Musculoskeletal: He exhibits no edema.  Neurological: He is alert and oriented to person, place, and time.  Skin: Skin is warm and dry. No rash noted. No erythema.  Psychiatric: He has a normal mood and affect. His behavior is normal. Judgment and thought content normal.  My exam today shows a WDWN male in NAD. CV:RRR no murmurs. Pulm: clear bilaterally. Ext: no edema. Psych: oriented. MSK: moves all ext equally  Labs    Chemistry  Recent Labs Lab 02/23/17 1506 02/23/17 1547 02/24/17 0443 02/26/17 0435  NA 138 142 136 138  K 3.6 3.5 3.5 4.0  CL 107 108 106 110  CO2 21*  --  22 20*  GLUCOSE 125* 125* 131* 112*  BUN 12 11 8 17   CREATININE 0.99 0.60* 0.80 0.92  CALCIUM 9.0  --  8.4* 8.7*  PROT 6.9  --   --   --   ALBUMIN 4.3  --   --   --   AST 51*  --   --   --   ALT 24  --   --   --   ALKPHOS 91  --   --   --   BILITOT 0.5  --   --   --   GFRNONAA >60  --  >60 >60  GFRAA >60  --  >60 >60  ANIONGAP 10  --  8 8     Hematology  Recent Labs Lab 02/24/17 0443 02/25/17 0425 02/26/17 0435  WBC 10.3 9.6 9.3  RBC 4.80 4.56 4.75  HGB 13.5 12.8* 13.6  HCT 40.7 38.8* 40.6  MCV 84.8 85.1 85.5  MCH 28.1 28.1 28.6  MCHC 33.2 33.0 33.5  RDW 13.7 13.7 13.7  PLT 247 220 211    Cardiac Enzymes  Recent Labs Lab 02/23/17 1821 02/23/17 2244 02/24/17 0443 02/26/17 0435  TROPONINI >65.00* >65.00* >65.00* 51.34*       Radiology    No results found.  Cardiac Studies   Cath-PCI 5/26 Pre-PCI diagram:     Post-Intervention Diagram     There is moderate to severe left ventricular systolic dysfunction.  The left ventricular ejection fraction is 35-45% by visual estimate.  LV end diastolic pressure is severely elevated.  Mid Cx lesion, 30  %stenosed.  -----CULPRIT LESION-------  Prox LAD to Mid LAD lesion, 100 %stenosed.  A STENT SYNERGY DES 3X24 drug eluting stent was successfully placed.  Post intervention, there is a 0% residual stenosis.  Dist LAD lesion, 95 %stenosed - distal embolization of thrombus to the small caliber apical LAD.   2D Echo 5/27: EF 30-35% with LAD territory hypokinesis / akinesis - akinesis of mid anterior, anteroseptal, inferoseptal and apical anterior, septal, inferior & apical walls.  Gr 1DD. IVC Dilated - elevated CVP.   Assessment & Plan   1. CAD/Anterior STEMI: Admitted with anterior STEMI secondary to occluded LAD treated with DES. Will continue ASA/Brilinta/statin/beta blocker.   2. Ischemic cardiomyopathy/Acute systolic CHF: Volume status is ok today. LVEF=35%. Will continue Coreg. No Ace-inh due to hypotension. Plan to restart at discharge. Will repeat limited echo today to assess LVEF. If LVEF is under 35%, will need Lifevest before discharge.   3. HLD: Continue statin.   4. Tobacco abuse: smoking cessation encouraged.     Signed, Lauree Chandler, MD  02/26/2017, 8:32 AM

## 2017-02-26 NOTE — Care Management Note (Addendum)
Case Management Note  Patient Details  Name: Blake [name] MRN: [id number] Date of Birth: 10-Oct-1955  Subjective/Objective:  Pt presented for Stemi-plan for home on Brilinta. 30 day free Brilinta card to be issued along with co pay card. Pt uses [facility] on [address]-Brilinta is available in store and co pay will be $25.00. No further needs from CM at this time.                 Action/Plan: CM referral for possible life vest- not needed per repeat echo. No further needs from CM at this time.   Expected Discharge Date:  02/26/17               Expected Discharge Plan:  Home/Self Care  In-House Referral:  NA  Discharge planning Services  CM Consult  Post Acute Care Choice:  NA Choice offered to:  NA  DME Arranged:    DME Agency:  NA  HH Arranged:  NA HH Agency:  NA  Status of Service:  Completed, signed off  If discussed at [place] of Stay Meetings, dates discussed:    Additional Comments:  Bethena Roys, RN 02/26/2017, 12:33 PM

## 2017-02-26 NOTE — Telephone Encounter (Signed)
New message    TCM appt on 6.6.2018 @ 9 am with [place]   Per Mendel Ryder APP called

## 2017-02-27 ENCOUNTER — Telehealth (HOSPITAL_COMMUNITY): Payer: Self-pay

## 2017-02-27 NOTE — Telephone Encounter (Signed)
Left message for pt to call.

## 2017-02-27 NOTE — Telephone Encounter (Signed)
Patient insurance is active and benefits verified. [employer] - no co-payment, deductible $750.00/$564.72 has been met, out of pocket $3000/$635.18 has been met, 20% co-insurance, no pre-authorization and no limit on visit. Passport/reference [phone].

## 2017-02-28 NOTE — Telephone Encounter (Signed)
Patient contacted regarding discharge from [place] on 02-26-17  Patient understands to follow up with provider strader on 03-06-17 at 9 am at [facility] Patient understands discharge instructions? yes Patient understands medications and regiment? yes Patient understands to bring all medications to this visit? yes

## 2017-03-06 ENCOUNTER — Ambulatory Visit (INDEPENDENT_AMBULATORY_CARE_PROVIDER_SITE_OTHER): Payer: 59 | Admitting: Student

## 2017-03-06 ENCOUNTER — Encounter: Payer: Self-pay | Admitting: Student

## 2017-03-06 VITALS — BP 98/76 | HR 63 | Ht 67.0 in | Wt 171.4 lb

## 2017-03-06 DIAGNOSIS — I213 ST elevation (STEMI) myocardial infarction of unspecified site: Secondary | ICD-10-CM

## 2017-03-06 DIAGNOSIS — I255 Ischemic cardiomyopathy: Secondary | ICD-10-CM

## 2017-03-06 DIAGNOSIS — E785 Hyperlipidemia, unspecified: Secondary | ICD-10-CM | POA: Diagnosis not present

## 2017-03-06 DIAGNOSIS — I2510 Atherosclerotic heart disease of native coronary artery without angina pectoris: Secondary | ICD-10-CM

## 2017-03-06 DIAGNOSIS — Z720 Tobacco use: Secondary | ICD-10-CM

## 2017-03-06 MED ORDER — ATORVASTATIN CALCIUM 80 MG PO TABS: 80.0000 mg | ORAL_TABLET | Freq: Every day | ORAL | 3 refills | Status: DC

## 2017-03-06 MED ORDER — TICAGRELOR 90 MG PO TABS: 90.0000 mg | ORAL_TABLET | Freq: Two times a day (BID) | ORAL | 3 refills | Status: DC

## 2017-03-06 MED ORDER — CARVEDILOL 3.125 MG PO TABS: 3.1250 mg | ORAL_TABLET | Freq: Two times a day (BID) | ORAL | 3 refills | Status: DC

## 2017-03-06 NOTE — Patient Instructions (Signed)
Medication Instructions: No changes   Procedures/Testing: Your physician has requested that you have an echocardiogram. Echocardiography is a painless test that uses sound waves to create images of your heart. It provides your doctor with information about the size and shape of your heart and how well your heart's chambers and valves are working. This procedure takes approximately one hour. There are no restrictions for this procedure. This will be done at [address].  Follow-Up: Your physician recommends that you schedule a follow-up appointment in: 3 months after the Echo with Dr. Ellyn Hack.   If you need a refill on your cardiac medications before your next appointment, please call your pharmacy.

## 2017-03-06 NOTE — Progress Notes (Signed)
Cardiology Office Note    Date:  03/06/2017   ID:  Blake [name], DOB 23-Aug-1956, MRN [id number]  PCP:  Cassandria Anger, MD  Cardiologist: Dr. Ellyn Hack  Chief Complaint  Patient presents with  . Hospitalization Follow-up    History of Present Illness:    Blake [name] is a 61 y.o. male with history of tobacco use and no prior cardiac history who presents to the office today for hospital follow-up.  He was recently admitted to [facility] from 5/26 - 02/26/2017 for evaluation of chest pain and found to have an anterior STEMI. Cardiac catheterization showed 100% occlusion of the prox-mid LAD with 95% distal LAD stenosis due to distal embolization of thrombus. A Synergy DES was placed to the prox-LAD. The day following his presentation, an echo showed a reduced EF of 30-35%. A repeat limited echo was done on 02/26/2017 and showed an improved EF of 40-45%, therefore he did not require a LifeVest. Lipid Panel with total cholesterol of 167, HDL 42, and LDL 111. He was started on DAPT with ASA and Brilinta along with statin and BB therapy. ACE-I was initiated in the setting of his cardiomyopathy but was discontinued secondary to hypotension.   In talking with the patient today, he reports overall doing well since his recent hospitalization. He denies any repeat episodes of chest discomfort or dyspnea on exertion. 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 Reports his job does not require any heavy lifting.  He denies any recent palpitations, orthopnea, PND, or lower extremity edema.  Reports good compliance with his medication regimen including ASA and Brilinta. He denies any evidence of active bleeding.  Past Medical History:  Diagnosis Date  . CAD (coronary artery disease), native coronary artery    a. 01/2017: anterior STEMI with 100% occlusion of the prox-mid LAD with 95% distal LAD stenosis due to distal embolization of thrombus. A Synergy DES was placed to the  prox-LAD.  [facility]) 2009   brain tumor  . Hyperlipemia   . Ischemic cardiomyopathy    a. 01/2017: initial echo showed a reduced EF of 30-35% with repeat echo of 40-45%.   . LUMBAR RADICULOPATHY, RIGHT 07/02/2008   Qualifier: Diagnosis of  By: Alain Marion MD, Evie Lacks Memory loss 07/05/2008   Qualifier: Diagnosis of  By: Alain Marion MD, [place] 07/05/2008   Qualifier: Diagnosis of  By: Alain Marion MD, Evie Lacks Neoplasm of unspecified nature of brain 08/18/2008   Qualifier: Diagnosis of  By: Alain Marion MD, Evie Lacks Rash and other nonspecific skin eruption 08/18/2008   Qualifier: Diagnosis of  By: Doralee Albino    . TOBACCO USER 11/28/2009   Qualifier: Diagnosis of  By: Plotnikov MD, Evie Lacks   . VITAMIN D DEFICIENCY 07/02/2008   Qualifier: Diagnosis of  By: Plotnikov MD, Evie Lacks     Past Surgical History:  Procedure Laterality Date  . CORONARY STENT INTERVENTION N/A 02/23/2017   Procedure: Coronary Stent Intervention;  Surgeon: Leonie Man, MD;  Location: [facility];  Service: Cardiovascular;  Laterality: N/A;  . CORONARY/GRAFT ACUTE MI REVASCULARIZATION N/A 02/23/2017   Procedure: Coronary/Graft Acute MI Revascularization;  Surgeon: Leonie Man, MD;  Location: [place] [facility];  Service: Cardiovascular;  Laterality: N/A;  . CRANIECTOMY / [place] TUMOR  2009   surgery and XRT at [facility]  . LEFT HEART CATH AND  CORONARY ANGIOGRAPHY N/A 02/23/2017   Procedure: Left Heart Cath and Coronary Angiography;  Surgeon: Leonie Man, MD;  Location: [place] [facility];  Service: Cardiovascular;  Laterality: N/A;    Current Medications: Outpatient Medications Prior to Visit  Medication Sig Dispense Refill  . aspirin 81 MG chewable tablet Chew 1 tablet (81 mg total) by mouth daily.    . Cholecalciferol (VITAMIN D3) 2000 units capsule Take 1 capsule (2,000 Units total) by mouth daily. 100 capsule 3  . nitroGLYCERIN  (NITROSTAT) 0.4 MG SL tablet Place 1 tablet (0.4 mg total) under the tongue every 5 (five) minutes as needed. 25 tablet 3  . atorvastatin (LIPITOR) 80 MG tablet Take 1 tablet (80 mg total) by mouth daily at 6 PM. 30 tablet 6  . carvedilol (COREG) 3.125 MG tablet Take 1 tablet (3.125 mg total) by mouth 2 (two) times daily with a meal. 60 tablet 3  . ticagrelor (BRILINTA) 90 MG TABS tablet Take 1 tablet (90 mg total) by mouth 2 (two) times daily. 60 tablet 0  . Cyanocobalamin (VITAMIN B-12) 1000 MCG SUBL Place 1 tablet (1,000 mcg total) under the tongue daily. 100 tablet 3   No facility-administered medications prior to visit.      Allergies:   Patient has no known allergies.   Social History   Social History  . Marital status: Married    Spouse name: N/A  . Number of children: 1  . Years of education: N/A   Occupational History  . 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 Smoking status: Light Tobacco Smoker    Packs/day: 0.07    Types: Cigarettes  . Smokeless tobacco: Never Used  . Alcohol use No  . Drug use: No  . Sexual activity: Yes   Other Topics Concern  . None   Social History Narrative  . None     Family History:  The patient's family history includes Alzheimer's disease in his father; Arthritis in his mother; Goiter in his mother; Hypertension in his other; Parkinsonism in his father.   Review of Systems:   Please see the history of present illness.     General:  No chills, fever, night sweats or weight changes.  Cardiovascular:  No dyspnea on exertion, edema, orthopnea, palpitations, paroxysmal nocturnal dyspnea. Positive for chest pain (now resolved).  Dermatological: No rash, lesions/masses Respiratory: No cough, dyspnea Urologic: No hematuria, dysuria Abdominal:   No nausea, vomiting, diarrhea, bright red blood per rectum, melena, or hematemesis Neurologic:  No visual changes, wkns, changes in mental status. All other systems reviewed and  are otherwise negative except as noted above.   Physical Exam:    VS:  BP 98/76 (BP Location: Right Arm, Patient Position: Sitting, Cuff Size: Normal)   Pulse 63   Ht 5\' 7"  (1.702 m)   Wt 171 lb 6.4 oz (77.7 kg)   BMI 26.85 kg/m    General: Well developed, well nourished male appearing in no acute distress. Head: Normocephalic, atraumatic, sclera non-icteric, no xanthomas, nares are without discharge.  Neck: No carotid bruits. JVD not elevated.  Lungs: Respirations regular and unlabored, without wheezes or rales.  Heart: Regular rate and rhythm. No S3 or S4.  No murmur, no rubs, or gallops appreciated. Abdomen: Soft, non-tender, non-distended with normoactive bowel sounds. No hepatomegaly. No rebound/guarding. No obvious abdominal masses. Msk:  Strength and tone appear normal for age. No joint deformities or effusions. Extremities: No clubbing or cyanosis. No edema.  Distal  pedal pulses are 2+ bilaterally. Radial cath site is stable with no ecchymosis or evidence of a hematoma.  Neuro: Alert and oriented X 3. Moves all extremities spontaneously. No focal deficits noted. Psych:  Responds to questions appropriately with a normal affect. Skin: No rashes or lesions noted  Wt Readings from Last 3 Encounters:  03/06/17 171 lb 6.4 oz (77.7 kg)  02/26/17 170 lb 6.4 oz (77.3 kg)  02/14/17 172 lb (78 kg)     Studies/Labs Reviewed:   EKG:  EKG is ordered today. The ekg ordered today demonstrates NSR, anterior ST abnormalities (improved when compared to prior tracings).   Recent Labs: 02/23/2017: ALT 24; Magnesium 1.9; TSH 0.521 02/26/2017: BUN 17; Creatinine, Ser 0.92; Hemoglobin 13.6; Platelets 211; Potassium 4.0; Sodium 138   Lipid Panel    Component Value Date/Time   CHOL 167 02/23/2017 1821   TRIG 72 02/23/2017 1821   HDL 42 02/23/2017 1821   CHOLHDL 4.0 02/23/2017 1821   VLDL 14 02/23/2017 1821   LDLCALC 111 (H) 02/23/2017 1821   LDLDIRECT 161.0 05/14/2012 0739    Additional  studies/ records that were reviewed today include:   Echocardiogram: 02/26/2017 Study Conclusions  - Technical notes: Attention Limited echo for LV function: - Left ventricle: The cavity size was normal. Wall thickness was   increased in a pattern of mild LVH. Systolic function was mildly   to moderately reduced. The estimated ejection fraction was in the   range of 40% to 45%. Mid to distal anterior, apical and distal   inferoapical severe hypokinesis to akinesis.  Impressions:  - Limited study for LV function. Compared to a prior study on   02/24/2017, the LVEF has improved to 40-45% with persistent LAD   territory wall motion abnormalities.  Cardiac Catheterization: 02/23/2017  There is moderate to severe left ventricular systolic dysfunction.  The left ventricular ejection fraction is 35-45% by visual estimate.  LV end diastolic pressure is severely elevated.  Mid Cx lesion, 30 %stenosed.  -----CULPRIT LESION-------  Prox LAD to Mid LAD lesion, 100 %stenosed.  A STENT SYNERGY DES 3X24 drug eluting stent was successfully placed.  Post intervention, there is a 0% residual stenosis.  Dist LAD lesion, 95 %stenosed - distal embolization of thrombus to the small caliber apical LAD.    Assessment:    1. Coronary artery disease involving native coronary artery of native heart without angina pectoris   2. ST elevation myocardial infarction (STEMI), subsequent episode of care ([place])   3. Ischemic cardiomyopathy   4. Hyperlipidemia LDL goal <70   5. Tobacco use      Plan:   In order of problems listed above:  1. CAD/ Subsequent Episode of Care for STEMI - recently admitted from 5/26 - 02/26/2017 for evaluation of chest pain and found to have an anterior STEMI. Cath showed 100% occlusion of the prox-mid LAD with 95% distal LAD stenosis due to distal embolization of thrombus. A Synergy DES was placed to the prox-LAD.  - he denies any repeat episodes of chest pain or  dyspnea on exertion.  - continue DAPT with ASA and Brilinta for at least 12 months. Continue BB and statin therapy. Referral for Phase 2 Cardiac Rehab has been entered.  2. Ischemic Cardiomyopathy - initial echo during his recent admission showed a reduced EF of 30-35%. A repeat limited echo was done on 02/26/2017 and showed an improved EF of 40-45%, therefore he did not require a LifeVest at the time of discharge.  -  he does not appear volume overloaded on physical examination. - continue Coreg 3.125mg  BID. BP does not allow for the addition of an ACE-I/ARB/Entresto at this time.   3. HLD - Lipid Panel in 01/2017 showed total cholesterol of 167, HDL 42, and LDL 111. Goal LDL is < 70 with known CAD. - continue Atorvastatin 80mg  daily. Recheck FLP/LFT's in 6-8 weeks.   4. Tobacco Use - He has reduced his smoking from 15 cigarettes per day down to 2 per day. Congratulated on this with full cessation advised.   Medication Adjustments/Labs and Tests Ordered: Current medicines are reviewed at length with the patient today.  Concerns regarding medicines are outlined above.  Medication changes, Labs and Tests ordered today are listed in the Patient Instructions below. Patient Instructions  Medication Instructions: No changes  Procedures/Testing: Your physician has requested that you have an echocardiogram. Echocardiography is a painless test that uses sound waves to create images of your heart. It provides your doctor with information about the size and shape of your heart and how well your heart's chambers and valves are working. This procedure takes approximately one hour. There are no restrictions for this procedure. This will be done at [address], [address].  Follow-Up: Your physician recommends that you schedule a follow-up appointment in: 3 months after the Echo with Dr. Ellyn Hack.  If you need a refill on your cardiac medications before your next appointment, please call your pharmacy.      Signed, Erma Heritage, PA-C  03/06/2017 1:45 PM    [facility] [place], [place], [place]  [address] Phone: [phone]; Fax: [phone]  [address], [place], [place] [address] Phone: [phone]

## 2017-03-08 ENCOUNTER — Telehealth (HOSPITAL_COMMUNITY): Payer: Self-pay

## 2017-03-08 NOTE — Telephone Encounter (Signed)
I called patient and left message on voicemail to call office about scheduling for cardiac rehab. I left my contact information on patient voicemail to return my call.

## 2017-03-12 IMAGING — DX DG CHEST 2V
2 series · 2 of 2 positions shown · non-contrast
Comparison: 08/18/2008.

CLINICAL DATA: 59-year-old smoker. Health maintenance. Current
history of posterior fossa meningioma.

EXAM:
CHEST  2 VIEW

[chest pa]
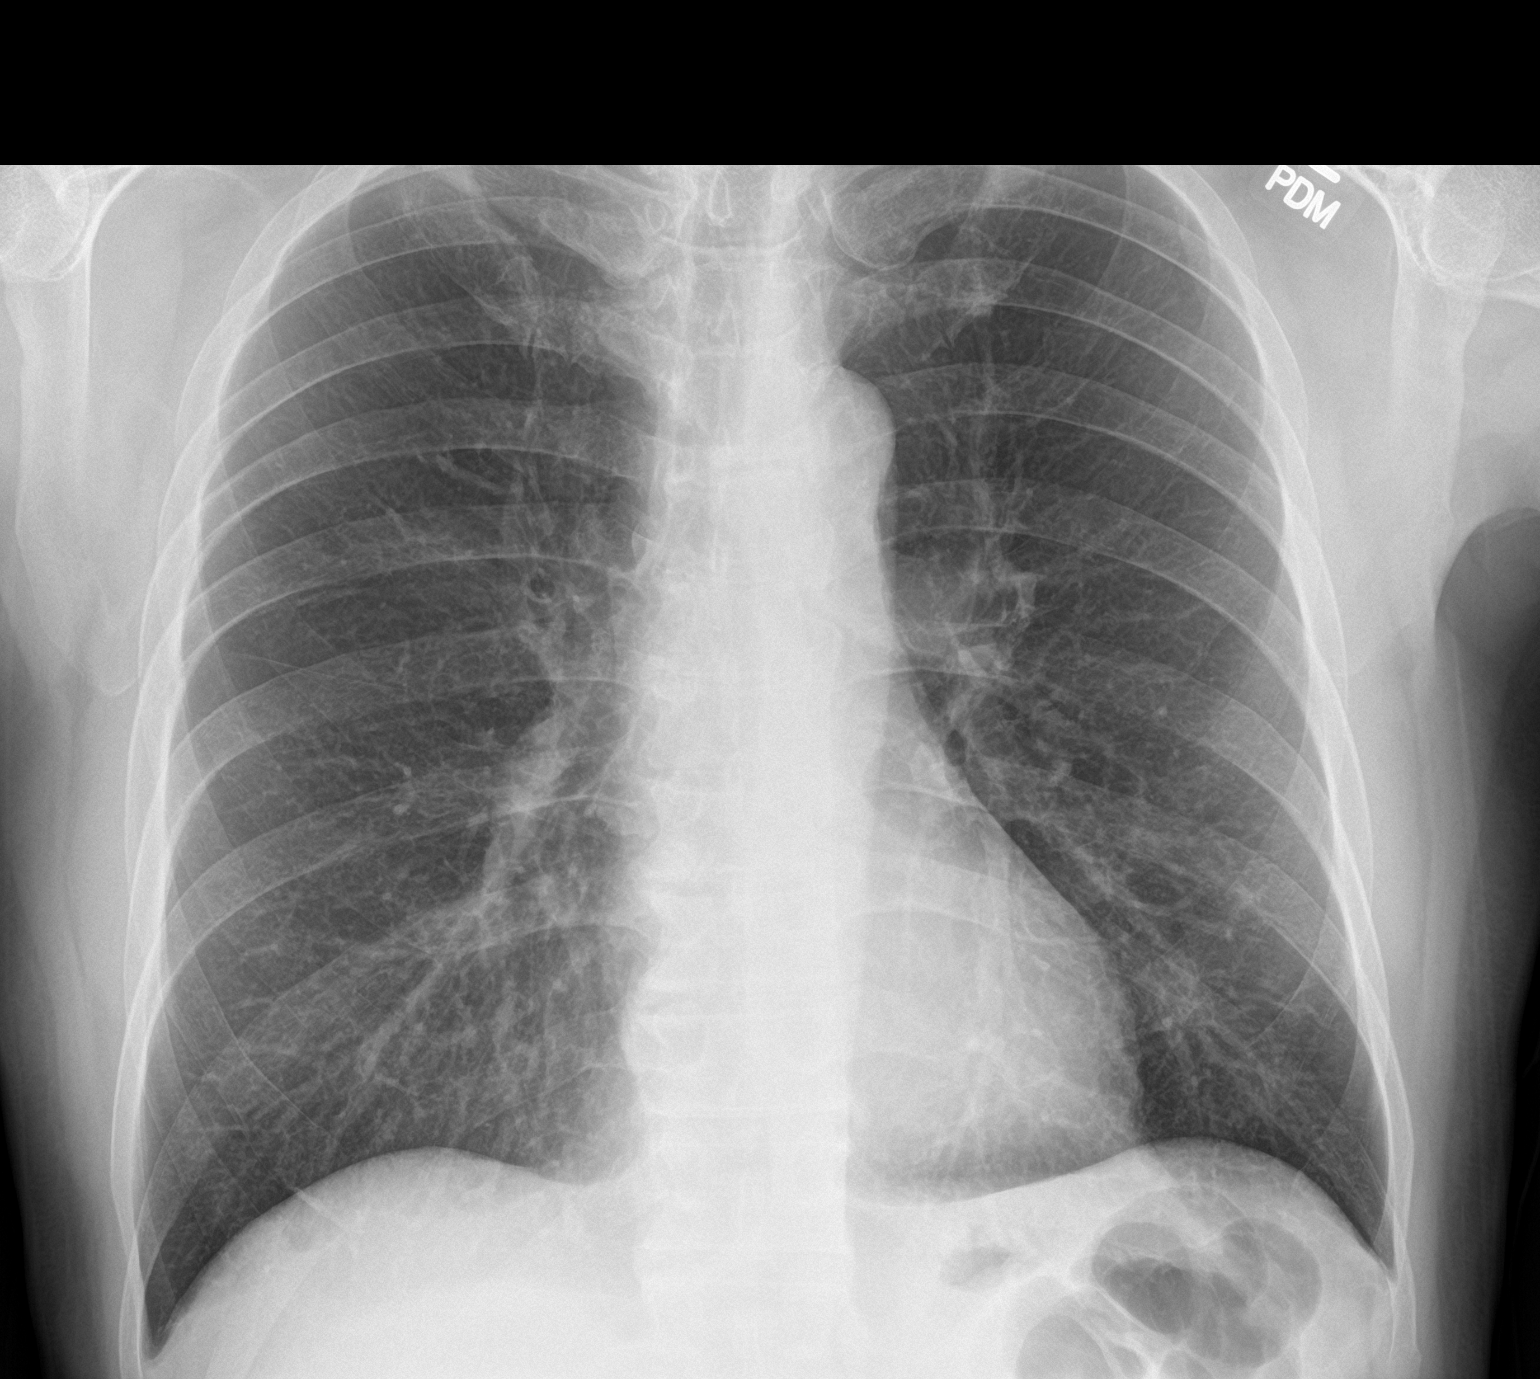

[chest lat]
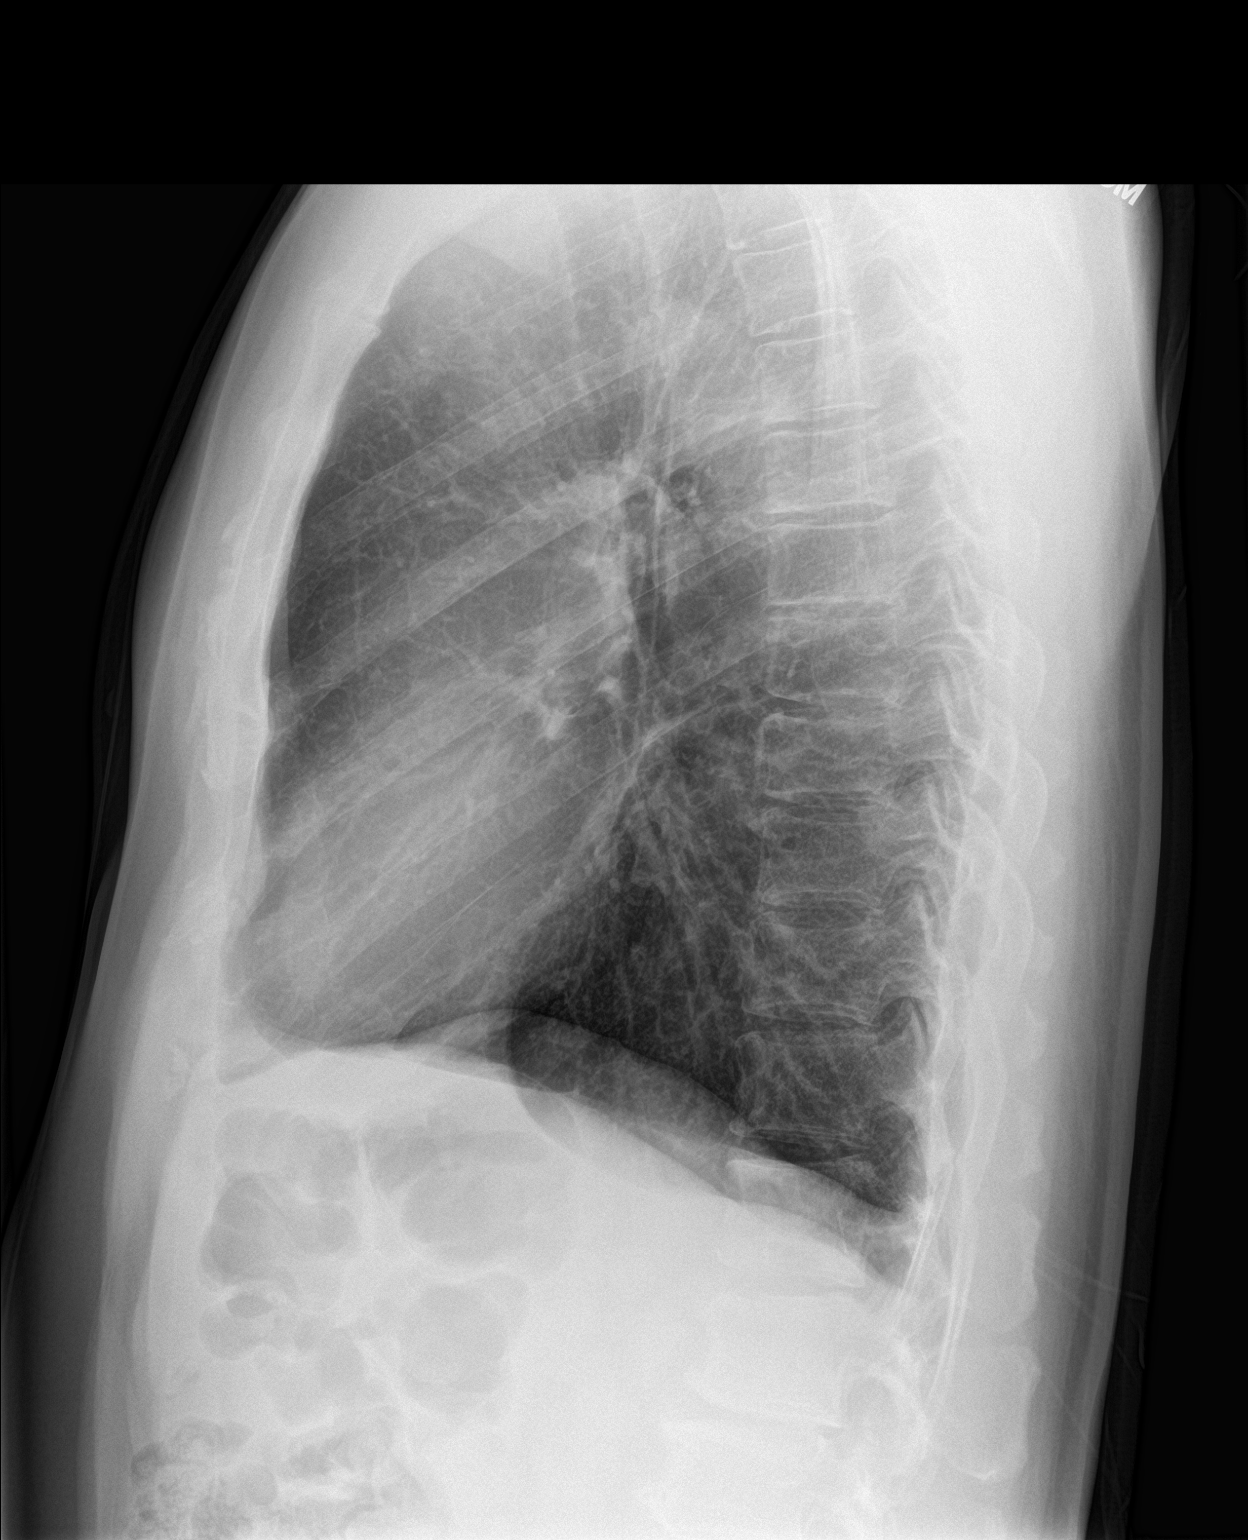

[2 of 2 positions shown; findings below may reference images not displayed]

FINDINGS: Cardiac silhouette normal in size, unchanged. Thoracic aorta
minimally atherosclerotic. Hilar and mediastinal contours otherwise
unremarkable. Stable mild hyperinflation. Lungs clear.
Bronchovascular markings normal. Pulmonary vascularity normal. No
visible pleural effusions. No pneumothorax. Visualized bony thorax
intact.
IMPRESSION: Mild hyperinflation indicating COPD and/or asthma. No acute
cardiopulmonary disease. Stable examination.

## 2017-03-20 ENCOUNTER — Telehealth (HOSPITAL_COMMUNITY): Payer: Self-pay

## 2017-03-20 NOTE — Telephone Encounter (Signed)
I called patient and left message on voicemail to call office about scheduling for cardiac rehab. I left my contact information on patient voicemail to return my call.

## 2017-03-20 NOTE — Telephone Encounter (Signed)
Patient returned my call, patient want to wait until September to schedule for cardiac rehab. Patient is going out of town for a month and has a lot of doctors appointment after that. Referral deferred until September.

## 2017-06-06 ENCOUNTER — Other Ambulatory Visit: Payer: Self-pay

## 2017-06-06 ENCOUNTER — Ambulatory Visit (HOSPITAL_COMMUNITY): Payer: 59 | Attending: Cardiovascular Disease

## 2017-06-06 DIAGNOSIS — Z720 Tobacco use: Secondary | ICD-10-CM | POA: Insufficient documentation

## 2017-06-06 DIAGNOSIS — E785 Hyperlipidemia, unspecified: Secondary | ICD-10-CM | POA: Diagnosis not present

## 2017-06-06 DIAGNOSIS — I2510 Atherosclerotic heart disease of native coronary artery without angina pectoris: Secondary | ICD-10-CM | POA: Insufficient documentation

## 2017-06-06 DIAGNOSIS — I255 Ischemic cardiomyopathy: Secondary | ICD-10-CM | POA: Insufficient documentation

## 2017-06-06 DIAGNOSIS — I252 Old myocardial infarction: Secondary | ICD-10-CM | POA: Insufficient documentation

## 2017-06-11 ENCOUNTER — Telehealth: Payer: Self-pay | Admitting: Internal Medicine

## 2017-06-11 NOTE — Telephone Encounter (Signed)
Advised pt, see results note, concerning echo findings. F/u confirmed.

## 2017-06-11 NOTE — Telephone Encounter (Signed)
Returning your call. °

## 2017-06-19 ENCOUNTER — Encounter: Payer: Self-pay | Admitting: Cardiology

## 2017-06-19 ENCOUNTER — Ambulatory Visit (INDEPENDENT_AMBULATORY_CARE_PROVIDER_SITE_OTHER): Payer: 59 | Admitting: Cardiology

## 2017-06-19 VITALS — BP 122/77 | HR 59 | Ht 67.0 in | Wt 168.6 lb

## 2017-06-19 DIAGNOSIS — I255 Ischemic cardiomyopathy: Secondary | ICD-10-CM

## 2017-06-19 DIAGNOSIS — Z87891 Personal history of nicotine dependence: Secondary | ICD-10-CM | POA: Diagnosis not present

## 2017-06-19 DIAGNOSIS — I5042 Chronic combined systolic (congestive) and diastolic (congestive) heart failure: Secondary | ICD-10-CM

## 2017-06-19 DIAGNOSIS — I25110 Atherosclerotic heart disease of native coronary artery with unstable angina pectoris: Secondary | ICD-10-CM

## 2017-06-19 DIAGNOSIS — E785 Hyperlipidemia, unspecified: Secondary | ICD-10-CM | POA: Diagnosis not present

## 2017-06-19 DIAGNOSIS — I2102 ST elevation (STEMI) myocardial infarction involving left anterior descending coronary artery: Secondary | ICD-10-CM

## 2017-06-19 NOTE — Progress Notes (Signed)
PCP: Plotnikov, Evie Lacks, MD  Clinic Note: Chief Complaint  Patient presents with  . Follow-up    after echo; Pt states no Sx.   . Coronary Artery Disease    Anterior STEMI with LAD PCI  . Cardiomyopathy    Ischemic    HPI: Blake [name] is a 61 y.o. male with a PMH below who presents today for 3 month follow-up for CAD-anterior STEMI with LAD PCI and ischemic cardiomyopathy. He has had a follow-up echocardiogram that we are discussing today.Marland Kitchen He had a large anterior STEMI on 02/23/2017 with an LAD PCI. Echo immediately after showed an EF of 30-35% with mid anterior anteroseptal, inferoseptal and apical and anterior/septal akinesis. Initial plans were for discharge with [facility] however he did have an echo done prior to discharge that showed an improved EF of 40-45% with persistent LAD territory wall motion abnormalities.  Blake Nix was last seen on 03/06/2017 for initial post hospital follow-up with Bernerd Pho, PA - was doing well with no major complaints. No chest pain or dyspnea. 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 He did have an echocardiogram ordered to reassess EF.  Recent Hospitalizations: None  Studies Personally Reviewed - (if available, images/films reviewed: From Epic Chart or Care Everywhere)  2 D Echo: EF 25-30% with akinesis/scarring of anteroseptal anterior and apical myocardium (LAD distribution). Gr 2 DD. This represents a reduction from the previously improved EF of 40-45% noted in May.  Interval History: Blake [name] presents today for routine follow-up and to discuss results of his echocardiogram. He is as confused as I am with the results noted. He indicates that he is still very active exercising at least 30 minutes to an hour a day. On the weekends he does a slow jog/walk for at least an hour. This is in his home neighborhood has plenty of hills. He denies any chest pain or shortness of breath with rest or exertion. No PND, orthopnea or edema. No palpitations,  lightheadedness, dizziness, weakness or syncope/near syncope. No TIA/amaurosis fugax symptoms.  No claudication.  He quit smoking shortly after getting home from his heart attack.  he indicates that really he went back to work one day after his heart attack during his routine job. He followed all the instructions as far as how to gradually build up his exercise has had no issues. He weighs himself daily and has not had no change in his weight. No swelling. No melena, hematochezia, hematuria, or epistaxis. No easy bruising or bleeding.  ROS: A comprehensive was performed. Review of Systems  Constitutional: Negative for malaise/fatigue.  HENT: Negative for nosebleeds.   Respiratory: Negative for cough and shortness of breath.   Gastrointestinal: Negative for blood in stool and melena.  Genitourinary: Negative for hematuria.  Musculoskeletal: Negative for joint pain.  Neurological: Negative for dizziness.  Endo/Heme/Allergies: Does not bruise/bleed easily.  Psychiatric/Behavioral: Negative for depression and memory loss.  All other systems reviewed and are negative.  I have reviewed and (if needed) personally updated the patient's problem list, medications, allergies, past medical and surgical history, social and family history.   Past Medical History:  Diagnosis Date  . CAD (coronary artery disease), native coronary artery    a. 01/2017: anterior STEMI with 100% occlusion of the prox-mid LAD with 95% distal LAD stenosis due to distal embolization of thrombus. A Synergy DES was placed to the prox-LAD.  Marland Kitchen Cancer [facility]) 2009   brain tumor  . Hyperlipemia   . Ischemic cardiomyopathy  a. 01/2017: initial echo showed a reduced EF of 30-35% with repeat echo of 40-45%.   . LUMBAR RADICULOPATHY, RIGHT 07/02/2008   Qualifier: Diagnosis of  By: Alain Marion MD, Evie Lacks Memory loss 07/05/2008   Qualifier: Diagnosis of  By: Alain Marion MD, [place] 07/05/2008   Qualifier:  Diagnosis of  By: Alain Marion MD, Evie Lacks Neoplasm of unspecified nature of brain 08/18/2008   Qualifier: Diagnosis of  By: Alain Marion MD, Evie Lacks Rash and other nonspecific skin eruption 08/18/2008   Qualifier: Diagnosis of  By: Doralee Albino    . TOBACCO USER 11/28/2009   Qualifier: Diagnosis of  By: Plotnikov MD, Evie Lacks   . VITAMIN D DEFICIENCY 07/02/2008   Qualifier: Diagnosis of  By: Plotnikov MD, Evie Lacks     Past Surgical History:  Procedure Laterality Date  . CORONARY STENT INTERVENTION N/A 02/23/2017   Procedure: Coronary Stent Intervention;  Surgeon: Leonie Man, MD;  Location: [facility];  Service: Cardiovascular;  Laterality: N/A;  . CORONARY/GRAFT ACUTE MI REVASCULARIZATION N/A 02/23/2017   Procedure: Coronary/Graft Acute MI Revascularization;  Surgeon: Leonie Man, MD;  Location: [place] [facility];  Service: Cardiovascular;  Laterality: N/A;  . CRANIECTOMY / [place] TUMOR  2009   surgery and XRT at [facility]  . LEFT HEART CATH AND CORONARY ANGIOGRAPHY N/A 02/23/2017   Procedure: Left Heart Cath and Coronary Angiography;  Surgeon: Leonie Man, MD;  Location: [place] [facility];  Service: Cardiovascular;  Laterality: N/A;   Cath -PCI 02/23/2017  There is moderate to severe left ventricular systolic dysfunction. The left ventricular ejection fraction is 35-45% by visual estimate.  LV end diastolic pressure is severely elevated.  -----CULPRIT LESION-------  Prox LAD to Mid LAD lesion, 100 %stenosed.  A STENT SYNERGY DES 3X24 drug eluting stent was successfully placed. Post intervention, there is a 0% residual stenosis.  Dist LAD lesion, 95 %stenosed - distal embolization of thrombus to the small caliber apical LAD   Wall Motion :  EF ~35%     Diagnostic Diagram   Post-Intervention Diagram          Current Meds  Medication Sig  . aspirin 81 MG chewable tablet Chew 1 tablet (81 mg total) by mouth daily.  Marland Kitchen  atorvastatin (LIPITOR) 80 MG tablet Take 1 tablet (80 mg total) by mouth daily at 6 PM.  . carvedilol (COREG) 3.125 MG tablet Take 1 tablet (3.125 mg total) by mouth 2 (two) times daily with a meal.  . Cholecalciferol (VITAMIN D3) 2000 units capsule Take 1 capsule (2,000 Units total) by mouth daily.  . meloxicam (MOBIC) 7.5 MG tablet Take 7.5 mg by mouth daily as needed. AS NEEDED FOR MODERATE PAIN  . nitroGLYCERIN (NITROSTAT) 0.4 MG SL tablet Place 1 tablet (0.4 mg total) under the tongue every 5 (five) minutes as needed.  . ticagrelor (BRILINTA) 90 MG TABS tablet Take 1 tablet (90 mg total) by mouth 2 (two) times daily.    No Known Allergies  Social History   Social History  . Marital status: Married    Spouse name: N/A  . Number of children: 1  . Years of education: N/A   Occupational History  . 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 Smoking status: Former Smoker    Packs/day: 0.07    Types: Cigarettes    Quit date: 03/31/2017  .  Smokeless tobacco: Never Used  . Alcohol use No  . Drug use: No  . Sexual activity: Yes   Other Topics Concern  . None   Social History Narrative   Very active. 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 He also walks daily after work for at least 30-45 minutes. On weekends he tries to walk at least an hour. This is usually a fast walk or slow jog.      He quit smoking shortly after his MI.    family history includes Alzheimer's disease in his father; Arthritis in his mother; Goiter in his mother; Hypertension in his other; Parkinsonism in his father.  Wt Readings from Last 3 Encounters:  06/19/17 168 lb 9.6 oz (76.5 kg)  03/06/17 171 lb 6.4 oz (77.7 kg)  02/26/17 170 lb 6.4 oz (77.3 kg)    PHYSICAL EXAM BP 122/77   Pulse (!) 59   Ht 5\' 7"  (1.702 m)   Wt 168 lb 9.6 oz (76.5 kg)   BMI 26.41 kg/m  Physical Exam  Constitutional: He is oriented to person, place, and time. He appears well-developed and well-nourished. No  distress.  Healthy-appearing  HENT:  Head: Normocephalic and atraumatic.  Mouth/Throat: No oropharyngeal exudate.  Eyes: EOM are normal. No scleral icterus.  Neck: Normal range of motion. Neck supple. No hepatojugular reflux and no JVD present. Carotid bruit is not present.  Cardiovascular: Normal rate, regular rhythm, normal heart sounds and intact distal pulses.   No extrasystoles are present. PMI is not displaced.  Exam reveals no gallop and no friction rub.   No murmur heard. Pulmonary/Chest: Effort normal and breath sounds normal. No respiratory distress. He has no wheezes. He has no rales.  Abdominal: Soft. Bowel sounds are normal. He exhibits no distension. There is no tenderness. There is no rebound and no guarding.  Musculoskeletal: Normal range of motion. He exhibits no edema.  Neurological: He is alert and oriented to person, place, and time.  Skin: Skin is warm and dry. No rash noted. No erythema.  Psychiatric: His behavior is normal. Judgment and thought content normal.  Nursing note and vitals reviewed.    Adult ECG Report Not checked  Other studies Reviewed: Additional studies/ records that were reviewed today include:  Recent Labs:   Lab Results  Component Value Date   CHOL 167 02/23/2017   HDL 42 02/23/2017   LDLCALC 111 (H) 02/23/2017   LDLDIRECT 161.0 05/14/2012   TRIG 72 02/23/2017   CHOLHDL 4.0 02/23/2017    Lab Results  Component Value Date   CREATININE 0.92 02/26/2017   BUN 17 02/26/2017   NA 138 02/26/2017   K 4.0 02/26/2017   CL 110 02/26/2017   CO2 20 (L) 02/26/2017   Lab Results  Component Value Date   HGBA1C 5.7 (H) 02/23/2017     ASSESSMENT / PLAN: Problem List Items Addressed This Visit    Chronic combined systolic and diastolic heart failure (HCC) (Chronic)    Basically euvolemic with no heart failure symptoms of PND orthopnea or edema. He is very active with routine exercise walks and jogs without dyspnea. He is on carvedilol and  his blood pressure looks great well-controlled. Heart rate 59 so would not increase that. We need to reevaluate his EF again to confirm what the EF truly is. I would be reluctant to add further afterload reduction for fear of worsening symptoms.  For now, no diuretic requirement. He will continue current dose of carvedilol.  Coronary artery disease involving native coronary artery of native heart with unstable angina pectoris (HCC) (Chronic)    Large anterior STEMI, however after a large diagonal branch. Had good reperfusion of the Vessel, but there was distal embolization to the apex. I would expect to have some apical akinesis and therefore may be an EF of maybe 45% which was seen prior to discharge. Very unexpected results on the most recent echo. Plan: Check cardiac MRI to reassess for ischemia and extent of infarct. He remains on Brilinta without any bleeding issues. He is on aspirin as well along with full dose atorvastatin and carvedilol. He is very active without any recurrent anginal symptoms or heart failure symptoms.      Relevant Orders   MR CARDIAC MORPHOLOGY W WO CONTRAST   Former cigarette smoker - Quit July 2018 (Chronic)    He very probably announce that he quit smoking in July. 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 He now lacks the desire to have a cigarette. I congratulated him on his efforts and encouraged him to continue doing so.      Hyperlipidemia LDL goal <70 (Chronic)    His lipids the time his MI showed LDL 111. He has been on high-dose statin now, adjust his diet and has been very active with exercise. We will reassess lipid panel and electrolytes in order to consider potentially de-escalating therapy.      Relevant Orders   Lipid panel   Comprehensive metabolic panel   Ischemic cardiomyopathy - Primary (Chronic)    Very  confusing/sleeping data initially showing improved ejection fraction on echocardiogram a couple days post PCI followed by significant reduction now 3 months after being on optimal medical management and significantly improved symptoms. He is not acting like someone with EF of 25%. In fact he is acting like someone is almost fully recovered. 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 I would like to get definitive data in order to make a decision of going forward as to whether or not he is boarded for ICD placement, or we need to also be more aggressive with his CHF management. Plan: Cardiac MRI      Relevant Orders   Comprehensive metabolic panel   MR CARDIAC MORPHOLOGY W WO CONTRAST    Other Visit Diagnoses    ST elevation myocardial infarction involving left anterior descending (LAD) coronary artery [facility])       Relevant Orders   MR CARDIAC MORPHOLOGY W WO CONTRAST      Current medicines are reviewed at length with the patient today. (+/- concerns) None The following changes have been made: None  Patient Instructions  NO MEDICATION CHANGE   LABS- CMP,LIPIDS-- DO NOT EAT OR DRINK THE MORNING. LAB [facility]P. MAY COME BACK TO THE OFFICE FOR LABWORK.  SCHEDULE AT [facility]  Your physician has requested that you have a cardiac MRI. Cardiac MRI uses a computer to create images of your heart as its beating, producing both still and moving pictures of your heart and major blood vessels. For further information please visit [email or web address] Please follow the instruction sheet given to you today for more information.PLEASE HAVE LABS DONE PRIOR TEST,    Your physician recommends that you schedule a follow-up appointment in [place]   If you need a refill on  your cardiac medications before your next appointment, please call your pharmacy.     Studies Ordered:   Orders Placed This Encounter  Procedures  . MR  CARDIAC MORPHOLOGY W WO CONTRAST  . Lipid panel  . Comprehensive metabolic panel      Glenetta Hew, M.D., M.S. Interventional Cardiologist   Pager # [phone] Phone # [phone] [address] [place], [place] [address]

## 2017-06-19 NOTE — Patient Instructions (Signed)
NO MEDICATION CHANGE   LABS- CMP,LIPIDS-- DO NOT EAT OR DRINK THE MORNING. LAB [facility]. MAY COME BACK TO THE OFFICE FOR LABWORK.  SCHEDULE AT [facility]  Your physician has requested that you have a cardiac MRI. Cardiac MRI uses a computer to create images of your heart as its beating, producing both still and moving pictures of your heart and major blood vessels. For further information please visit [email or web address] Please follow the instruction sheet given to you today for more information.PLEASE HAVE LABS DONE PRIOR TEST,    Your physician recommends that you schedule a follow-up appointment in [place]   If you need a refill on your cardiac medications before your next appointment, please call your pharmacy.

## 2017-06-21 ENCOUNTER — Telehealth: Payer: Self-pay | Admitting: Cardiology

## 2017-06-21 ENCOUNTER — Encounter: Payer: Self-pay | Admitting: Cardiology

## 2017-06-21 NOTE — Assessment & Plan Note (Signed)
His lipids the time his MI showed LDL 111. He has been on high-dose statin now, adjust his diet and has been very active with exercise. We will reassess lipid panel and electrolytes in order to consider potentially de-escalating therapy.

## 2017-06-21 NOTE — Assessment & Plan Note (Addendum)
Very confusing/sleeping data initially showing improved ejection fraction on echocardiogram a couple days post PCI followed by significant reduction now 3 months after being on optimal medical management and significantly improved symptoms. He is not acting like someone with EF of 25%. In fact he is acting like someone is almost fully recovered. 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 I would like to get definitive data in order to make a decision of going forward as to whether or not he is boarded for ICD placement, or we need to also be more aggressive with his CHF management. Plan: Cardiac MRI

## 2017-06-21 NOTE — Assessment & Plan Note (Signed)
Basically euvolemic with no heart failure symptoms of PND orthopnea or edema. He is very active with routine exercise walks and jogs without dyspnea. He is on carvedilol and his blood pressure looks great well-controlled. Heart rate 59 so would not increase that. We need to reevaluate his EF again to confirm what the EF truly is. I would be reluctant to add further afterload reduction for fear of worsening symptoms.  For now, no diuretic requirement. He will continue current dose of carvedilol.

## 2017-06-21 NOTE — Assessment & Plan Note (Signed)
He very probably announce that he quit smoking in July. 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 He now lacks the desire to have a cigarette. I congratulated him on his efforts and encouraged him to continue doing so.

## 2017-06-21 NOTE — Assessment & Plan Note (Signed)
Large anterior STEMI, however after a large diagonal branch. Had good reperfusion of the Vessel, but there was distal embolization to the apex. I would expect to have some apical akinesis and therefore may be an EF of maybe 45% which was seen prior to discharge. Very unexpected results on the most recent echo. Plan: Check cardiac MRI to reassess for ischemia and extent of infarct. He remains on Brilinta without any bleeding issues. He is on aspirin as well along with full dose atorvastatin and carvedilol. He is very active without any recurrent anginal symptoms or heart failure symptoms.

## 2017-06-21 NOTE — Telephone Encounter (Signed)
Called patient and gave him the date, time and location of cardiac MRI.  Letter mailed to the patient today.

## 2017-06-26 DIAGNOSIS — E785 Hyperlipidemia, unspecified: Secondary | ICD-10-CM | POA: Diagnosis not present

## 2017-06-26 DIAGNOSIS — I255 Ischemic cardiomyopathy: Secondary | ICD-10-CM | POA: Diagnosis not present

## 2017-06-26 LAB — COMPREHENSIVE METABOLIC PANEL
A/G RATIO: 1.9 (ref 1.2–2.2)
ALBUMIN: 4.2 g/dL (ref 3.5–5.5)
ALK PHOS: 111 IU/L (ref 39–117)
ALT: 28 IU/L (ref 0–44)
AST: 29 IU/L (ref 0–40)
BILIRUBIN TOTAL: 0.6 mg/dL (ref 0.0–1.2)
BUN / CREAT RATIO: 16 (ref 9–20)
BUN: 14 mg/dL (ref 6–24)
CO2: 21 mmol/L (ref 20–29)
CREATININE: 0.86 mg/dL (ref 0.76–1.27)
Calcium: 9.1 mg/dL (ref 8.7–10.2)
Chloride: 104 mmol/L (ref 96–106)
GFR calc Af Amer: 110 mL/min/{1.73_m2} (ref >59)
GFR calc non Af Amer: 96 mL/min/{1.73_m2} (ref >59)
GLOBULIN, TOTAL: 2.2 g/dL (ref 1.5–4.5)
Glucose: 100 mg/dL — ABNORMAL HIGH (ref 65–99)
POTASSIUM: 4.9 mmol/L (ref 3.5–5.2)
SODIUM: 141 mmol/L (ref 134–144)
Total Protein: 6.4 g/dL (ref 6.0–8.5)

## 2017-06-26 LAB — LIPID PANEL
CHOLESTEROL TOTAL: 102 mg/dL (ref 100–199)
Chol/HDL Ratio: 2.7 ratio (ref 0.0–5.0)
HDL: 38 mg/dL — AB (ref >39)
LDL CALC: 50 mg/dL (ref 0–99)
TRIGLYCERIDES: 68 mg/dL (ref 0–149)
VLDL CHOLESTEROL CAL: 14 mg/dL (ref 5–40)

## 2017-07-01 ENCOUNTER — Telehealth: Payer: Self-pay | Admitting: *Deleted

## 2017-07-01 MED ORDER — ATORVASTATIN CALCIUM 40 MG PO TABS: 40.0000 mg | ORAL_TABLET | Freq: Every day | ORAL | 3 refills | Status: DC

## 2017-07-01 NOTE — Telephone Encounter (Signed)
Spoke to patient. Result given . Verbalized understanding  PATIENT WOULD LIKE A 40 MG TABLET CALLED INTO PHARMACY   DONE - E-SENT 90 DAY SUPPLY WITH 3 REFILLS

## 2017-07-01 NOTE — Telephone Encounter (Signed)
-----   Message from Blake Man, MD sent at 06/27/2017  5:56 PM EDT ----- Doristine Devoid news on the cholesterol. Total cholesterol 102, HDL is now little low simply because of the overall low cholesterol level. The still relatively good at 38. LDL dropped dramatically down to 50 from 111 -  4 months ago. Triglycerides are excellent at 68.  We can definitely reduce atorvastatin dose to 40 mg daily. Chemistry panel looks great with the exception of slightly elevated glucose of 100. Normal kidney function. Normal potassium levels and liver function.  Glenetta Hew, MD

## 2017-07-02 ENCOUNTER — Ambulatory Visit (HOSPITAL_COMMUNITY)
Admission: RE | Admit: 2017-07-02 | Discharge: 2017-07-02 | Disposition: A | Discharge status: Home / Self Care | Payer: 59 | Source: Ambulatory Visit | Attending: Cardiology | Admitting: Cardiology

## 2017-07-02 DIAGNOSIS — I25110 Atherosclerotic heart disease of native coronary artery with unstable angina pectoris: Secondary | ICD-10-CM | POA: Insufficient documentation

## 2017-07-02 DIAGNOSIS — I2102 ST elevation (STEMI) myocardial infarction involving left anterior descending coronary artery: Secondary | ICD-10-CM | POA: Diagnosis not present

## 2017-07-02 DIAGNOSIS — I255 Ischemic cardiomyopathy: Secondary | ICD-10-CM | POA: Diagnosis not present

## 2017-07-02 MED ORDER — GADOBENATE DIMEGLUMINE 529 MG/ML IV SOLN
25.0000 mL | Freq: Once | INTRAVENOUS | Status: AC | PRN
  Administered 2017-07-02: 25 mL via INTRAVENOUS

## 2017-08-02 ENCOUNTER — Ambulatory Visit: Payer: 59 | Admitting: Cardiology

## 2017-08-05 ENCOUNTER — Telehealth (HOSPITAL_COMMUNITY): Payer: Self-pay

## 2017-08-05 NOTE — Telephone Encounter (Signed)
Attempted to call patient in regards to Cardiac Rehab - Lm on Vm °

## 2017-08-05 NOTE — Telephone Encounter (Signed)
UPDATED: Patients insurance is active and benefits verified through [facility] - no co-payment, deductible amount of $750.00/$750.00 has been met, out of pocket amount of $3,000/$3,000 has been met, 20% co-insurance, and no pre-authorization is required. Patient is covered at 100%. Passport/reference [id number]

## 2017-08-08 ENCOUNTER — Ambulatory Visit (INDEPENDENT_AMBULATORY_CARE_PROVIDER_SITE_OTHER): Payer: 59 | Admitting: Cardiology

## 2017-08-08 ENCOUNTER — Encounter: Payer: Self-pay | Admitting: Cardiology

## 2017-08-08 VITALS — BP 120/64 | HR 67 | Ht 67.0 in | Wt 174.0 lb

## 2017-08-08 DIAGNOSIS — Z87891 Personal history of nicotine dependence: Secondary | ICD-10-CM | POA: Diagnosis not present

## 2017-08-08 DIAGNOSIS — I255 Ischemic cardiomyopathy: Secondary | ICD-10-CM

## 2017-08-08 DIAGNOSIS — I25110 Atherosclerotic heart disease of native coronary artery with unstable angina pectoris: Secondary | ICD-10-CM

## 2017-08-08 DIAGNOSIS — E785 Hyperlipidemia, unspecified: Secondary | ICD-10-CM

## 2017-08-08 DIAGNOSIS — I5042 Chronic combined systolic (congestive) and diastolic (congestive) heart failure: Secondary | ICD-10-CM | POA: Diagnosis not present

## 2017-08-08 MED ORDER — LOSARTAN POTASSIUM 25 MG PO TABS: 25.0000 mg | ORAL_TABLET | Freq: Every day | ORAL | 3 refills | Status: DC

## 2017-08-08 NOTE — Assessment & Plan Note (Signed)
Lipids look pretty good by recent check on current dose of statin. Continue current dose of Lipitor.

## 2017-08-08 NOTE — Assessment & Plan Note (Signed)
Large anterior STEMI with occluded proximal LAD.  This is just after a large diagonal branch.  Downstream occlusion in the apex.  Unfortunately, would appear that the timing of revascularization did not save a good section of the heart as noted on transient mural scar on MRI.  Remains on stable dose of beta-blocker along with aspirin and Brilinta.  On statin now and adding ARB.

## 2017-08-08 NOTE — Patient Instructions (Signed)
MEDICATION  -- START TAKING LOSARTAN 25 MG ONE TABLET DAILY     You have been referred to  [place] [facility] TO DISCUSS WITH ELECTROPHYSIOLOGIST- [place]   Your physician recommends that you schedule a follow-up appointment in 1 MONTH WITH EXTENDER -- [facility]   Your physician recommends that you schedule a follow-up appointment in [place]

## 2017-08-08 NOTE — Progress Notes (Signed)
PCP: Plotnikov, Evie Lacks, MD  Clinic Note: Chief Complaint  Patient presents with  . Follow-up    No complaints.  MRI results  . Coronary Artery Disease  . Cardiomyopathy    HPI: Blake [name] is a 61 y.o. male with a PMH below who presents today for 2 month follow-up for CAD-anterior STEMI with LAD PCI and ischemic cardiomyopathy.  His second post MI echo showed improvement of his EF up to 40-45%, however the most recent follow-up echo showed the EF being back to 25-30%.  He now presents to discuss results of the cardiac MRI noted below.  Large anterior STEMI on 02/23/2017 with an LAD PCI.   Echo immediately after showed an EF of 30-35% with mid anterior anteroseptal, inferoseptal and apical and anterior/septal akinesis. Initial plans were for discharge with [facility] however he did have an echo done prior to discharge that showed an improved EF of 40-45% with persistent LAD territory wall motion abnormalities.  2 D Echo: EF 25-30% with akinesis/scarring of anteroseptal anterior and apical myocardium (LAD distribution). Gr 2 DD. This represents a reduction from the previously improved EF of 40-45% noted in May.  Blake [name] was last seen on September 19.  He was doing well very active at least 30 minutes of exercise with we are somewhat concerned having reviewed his echocardiogram showing again reduced EF of 25-30% -> we decided to check a cardiac MRI  Recent Hospitalizations: None  Studies Personally Reviewed - (if available, images/films reviewed: From Epic Chart or Care Everywhere)  July 02, 2017: Moderate left ventricular enlargement with large anterior MI.  This is a full-thickness scar involving the mid and distal into the septum apex and inferoapex.  Normal RV.  This patient would appear to have a large substrate for reentrant VT and would be a candidate for AICD.  Interval History: Blake [name] presents today still not noticing any significant symptoms.  He only notes dyspnea  with walking up hills.  He tells me that this is not really much different than it was before he had his heart attack.  Otherwise he denies any resting or exertional chest tightness or pressure.  Only mild exertional dyspnea with routine activity, but more pronounced with going up hills.  No PND, orthopnea or edema.  No rapid irregular heartbeats or palpitations.  No lightheadedness, dizziness, weakness, syncope/near syncope or TIAs or amaurosis fugax.  No claudication.  Leading up to this visit, his blood pressure has not been on a high just kept him on low-dose carvedilol, he is not having any signs of orthostatic hypotension, and actually notes that his blood pressures have been running a little higher in the mornings than usual.  No claudication.  He continues to do well with his smoking cessation..  No bleeding issues of melena, hematochezia, hematuria, epistaxis or significant bruising on aspirin and Brilinta.  ROS: A comprehensive was performed. Review of Systems  Constitutional: Negative for malaise/fatigue.  HENT: Negative for nosebleeds.   Respiratory: Negative for cough and shortness of breath.   Gastrointestinal: Negative for blood in stool and melena.  Genitourinary: Negative for hematuria.  Musculoskeletal: Negative for joint pain.  Neurological: Negative for dizziness.  Endo/Heme/Allergies: Does not bruise/bleed easily.  Psychiatric/Behavioral: Negative for depression and memory loss.  All other systems reviewed and are negative.  I have reviewed and (if needed) personally updated the patient's problem list, medications, allergies, past medical and surgical history, social and family history.   Past Medical History:  Diagnosis Date  .  CAD (coronary artery disease), native coronary artery    a. 01/2017: anterior STEMI with 100% occlusion of the prox-mid LAD with 95% distal LAD stenosis due to distal embolization of thrombus. A Synergy DES was placed to the prox-LAD.  [facility]land Kitchen  Cancer [facility]) 2009   brain tumor  . Hyperlipemia   . Ischemic cardiomyopathy    a. 01/2017: initial echo showed a reduced EF of 30-35% with repeat echo of 40-45%. -->  However follow-up echo showed EF of 30-35%, EF confirmed to be 30% by MRI October 2018.  Marland Kitchen LUMBAR RADICULOPATHY, RIGHT 07/02/2008   Qualifier: Diagnosis of  By: Alain Marion MD, Evie Lacks Memory loss 07/05/2008   Qualifier: Diagnosis of  By: Alain Marion MD, [place] 07/05/2008   Qualifier: Diagnosis of  By: Alain Marion MD, Evie Lacks Neoplasm of unspecified nature of brain 08/18/2008   Qualifier: Diagnosis of  By: Alain Marion MD, Evie Lacks Rash and other nonspecific skin eruption 08/18/2008   Qualifier: Diagnosis of  By: Doralee Albino    . ST elevation myocardial infarction (STEMI) of anterior wall ([place]) 01/2017   Large anterior infarct.  . TOBACCO USER 11/28/2009   Qualifier: Diagnosis of  By: Alain Marion MD, Evie Lacks   . VITAMIN D DEFICIENCY 07/02/2008   Qualifier: Diagnosis of  By: Plotnikov MD, Evie Lacks     Past Surgical History:  Procedure Laterality Date  . CRANIECTOMY / [place] TUMOR  2009   surgery and XRT at [facility] -PCI 02/23/2017  There is moderate to severe left ventricular systolic dysfunction. The left ventricular ejection fraction is 35-45% by visual estimate.  LV end diastolic pressure is severely elevated.  -----CULPRIT LESION-------  Prox LAD to Mid LAD lesion, 100 %stenosed.  A STENT SYNERGY DES 3X24 drug eluting stent was successfully placed. Post intervention, there is a 0% residual stenosis.  Dist LAD lesion, 95 %stenosed - distal embolization of thrombus to the small caliber apical LAD   Wall Motion :  EF ~35%     Diagnostic Diagram   Post-Intervention Diagram          Current Meds  Medication Sig  . aspirin 81 MG chewable tablet Chew 1 tablet (81 mg total) by mouth daily.  Marland Kitchen atorvastatin (LIPITOR) 40 MG tablet Take 1 tablet (40 mg  total) by mouth daily.  . carvedilol (COREG) 3.125 MG tablet Take 1 tablet (3.125 mg total) by mouth 2 (two) times daily with a meal.  . Cholecalciferol (VITAMIN D3) 2000 units capsule Take 1 capsule (2,000 Units total) by mouth daily.  . meloxicam (MOBIC) 7.5 MG tablet Take 7.5 mg by mouth daily as needed. AS NEEDED FOR MODERATE PAIN  . nitroGLYCERIN (NITROSTAT) 0.4 MG SL tablet Place 1 tablet (0.4 mg total) under the tongue every 5 (five) minutes as needed.  . ticagrelor (BRILINTA) 90 MG TABS tablet Take 1 tablet (90 mg total) by mouth 2 (two) times daily.    No Known Allergies  Social History   Socioeconomic History  . Marital status: Married    Spouse name: None  . Number of children: 1  . Years of education: None  . Highest education level: None  Social Needs  . Financial resource strain: None  . Food insecurity - worry: None  . Food insecurity - inability: None  . Transportation needs - medical: None  . Transportation needs - non-medical: None  Occupational History  .  Occupation: Lobbyist: [employer]  Tobacco Use  . Smoking status: Former Smoker    Packs/day: 0.07    Types: Cigarettes    Last attempt to quit: 03/31/2017    Years since quitting: 0.3  . Smokeless tobacco: Never Used  Substance and Sexual Activity  . Alcohol use: No  . Drug use: No  . Sexual activity: Yes  Other Topics Concern  . None  Social History Narrative   Very active. 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 He also walks daily after work for at least 30-45 minutes. On weekends he tries to walk at least an hour. This is usually a fast walk or slow jog.      He quit smoking shortly after his MI.    family history includes Alzheimer's disease in his father; Arthritis in his mother; Goiter in his mother; Hypertension in his other; Parkinsonism in his father.  Wt Readings from Last 3 Encounters:  08/08/17 174 lb (78.9 kg)  06/19/17 168 lb 9.6 oz (76.5 kg)  03/06/17 171 lb 6.4  oz (77.7 kg)    PHYSICAL EXAM BP 120/64   Pulse 67   Ht 5\' 7"  (1.702 m)   Wt 174 lb (78.9 kg)   SpO2 97%   BMI 27.25 kg/m  Physical Exam  Constitutional: He is oriented to person, place, and time. He appears well-developed and well-nourished. No distress.  Healthy-appearing  HENT:  Head: Normocephalic and atraumatic.  Mouth/Throat: No oropharyngeal exudate.  Eyes: EOM are normal. No scleral icterus.  Neck: Normal range of motion. Neck supple. No hepatojugular reflux and no JVD present. Carotid bruit is not present.  Cardiovascular: Normal rate, regular rhythm, normal heart sounds and intact distal pulses.  No extrasystoles are present. PMI is not displaced. Exam reveals no gallop and no friction rub.  No murmur heard. Pulmonary/Chest: Effort normal and breath sounds normal. No respiratory distress. He has no wheezes. He has no rales.  Abdominal: Soft. Bowel sounds are normal. He exhibits no distension. There is no tenderness. There is no rebound and no guarding.  Musculoskeletal: Normal range of motion. He exhibits no edema.  Neurological: He is alert and oriented to person, place, and time.  Skin: Skin is warm and dry. No rash noted. No erythema.  Psychiatric: His behavior is normal. Judgment and thought content normal.  Nursing note and vitals reviewed.    Adult ECG Report Not checked  Other studies Reviewed: Additional studies/ records that were reviewed today include:  Recent Labs:   Lab Results  Component Value Date   CHOL 102 06/26/2017   HDL 38 (L) 06/26/2017   LDLCALC 50 06/26/2017   LDLDIRECT 161.0 05/14/2012   TRIG 68 06/26/2017   CHOLHDL 2.7 06/26/2017    Lab Results  Component Value Date   CREATININE 0.86 06/26/2017   BUN 14 06/26/2017   NA 141 06/26/2017   K 4.9 06/26/2017   CL 104 06/26/2017   CO2 21 06/26/2017   Lab Results  Component Value Date   HGBA1C 5.7 (H) 02/23/2017     ASSESSMENT / PLAN: Problem List Items Addressed This Visit     Chronic combined systolic and diastolic heart failure (HCC) (Chronic)    Essentially euvolemic with no exacerbation symptoms.  He does routine exercise walking and jogging without significant symptoms.  No PND orthopnea.  He is on low-dose carvedilol -unable to titrate further because of bradycardia. With notable reduced EF on MRI, plan is to initiate  ARB and gently transition to [facility].      Relevant Medications   losartan (COZAAR) 25 MG tablet   Other Relevant Orders   Ambulatory referral to Cardiology   Coronary artery disease involving native coronary artery of native heart with unstable angina pectoris ([place]) - Primary (Chronic)    Large anterior STEMI with occluded proximal LAD.  This is just after a large diagonal branch.  Downstream occlusion in the apex.  Unfortunately, would appear that the timing of revascularization did not save a good section of the heart as noted on transient mural scar on MRI.  Remains on stable dose of beta-blocker along with aspirin and Brilinta.  On statin now and adding ARB.      Relevant Medications   losartan (COZAAR) 25 MG tablet   Former cigarette smoker - Quit July 2018 (Chronic)    Still doing well with having not had a cigarette since his MI.  I congratulated his efforts.  No longer having smoker's cough.      Hyperlipidemia LDL goal <70 (Chronic)    Lipids look pretty good by recent check on current dose of statin. Continue current dose of Lipitor.      Relevant Medications   losartan (COZAAR) 25 MG tablet   Ischemic cardiomyopathy (Chronic)    Large anterior infarct on MRI with reduced EF 30% range.  Thankfully, he seems to be compensating very well without any major complaints or symptoms. At this point I am not sure how much more improvement is to be.  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  I will recommend referral for ICD.  He is on low-dose beta-blocker, which I really cannot titrate  up further based on his resting heart rates being in the low 60s..    Plan:  add losartan.  The hope would be that this could be titrated up and potentially switched over to [facility]. He is euvolemic and therefore I am not starting a diuretic such as spironolactone or furosemide.      Relevant Medications   losartan (COZAAR) 25 MG tablet   Other Relevant Orders   Ambulatory referral to Cardiology      Current medicines are reviewed at length with the patient today. (+/- concerns) None The following changes have been made: None  Patient Instructions  MEDICATION  -- START TAKING LOSARTAN 25 MG ONE TABLET DAILY     You have been referred to  [facility] TO DISCUSS WITH ELECTROPHYSIOLOGIST- [place]   Your physician recommends that you schedule a follow-up appointment in 1 MONTH WITH EXTENDER -- B. Irvona   Your physician recommends that you schedule a follow-up appointment in 3 MONTHS WITH DR Taijon Vink    Studies Ordered:   Orders Placed This Encounter  Procedures  . Ambulatory referral to Cardiology      Glenetta Hew, M.D., M.S. Interventional Cardiologist   Pager # [phone] Phone # [phone] [address] [place], [place] [address]

## 2017-08-08 NOTE — Assessment & Plan Note (Signed)
Still doing well with having not had a cigarette since his MI.  I congratulated his efforts.  No longer having smoker's cough.

## 2017-08-08 NOTE — Assessment & Plan Note (Signed)
Large anterior infarct on MRI with reduced EF 30% range.  Thankfully, he seems to be compensating very well without any major complaints or symptoms. At this point I am not sure how much more improvement is to be.  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  I will recommend referral for ICD.  He is on low-dose beta-blocker, which I really cannot titrate up further based on his resting heart rates being in the low 60s..    Plan:  add losartan.  The hope would be that this could be titrated up and potentially switched over to [facility]. He is euvolemic and therefore I am not starting a diuretic such as spironolactone or furosemide.

## 2017-08-08 NOTE — Assessment & Plan Note (Signed)
Essentially euvolemic with no exacerbation symptoms.  He does routine exercise walking and jogging without significant symptoms.  No PND orthopnea.  He is on low-dose carvedilol -unable to titrate further because of bradycardia. With notable reduced EF on MRI, plan is to initiate ARB and gently transition to [facility].

## 2017-08-13 NOTE — Telephone Encounter (Signed)
Called and spoke with patient in regards to Cardiac Rehab - Patient is interested. Explained what this program has to offer and what it's about. He asked that I mailed Cardiac Rehab Program Brochure. Patient would like to look that over then schedule. Mailed brochure.

## 2017-08-20 ENCOUNTER — Telehealth (HOSPITAL_COMMUNITY): Payer: Self-pay

## 2017-08-20 NOTE — Telephone Encounter (Signed)
Called and spoke with patient in regards to [place] to follow up with patient. Mailed brochure last week - Patient stated he received brochure but still needs a couple of days to figure out his decision. He has an appt tomorrow and would like to further discuss with his Dr. Patient stated he will call back on Monday.

## 2017-08-20 NOTE — Telephone Encounter (Signed)
2nd attempt to call patient in regards to Cardiac Rehab - Lm on Vm

## 2017-08-21 ENCOUNTER — Ambulatory Visit (INDEPENDENT_AMBULATORY_CARE_PROVIDER_SITE_OTHER): Payer: 59 | Admitting: Cardiology

## 2017-08-21 ENCOUNTER — Encounter: Payer: Self-pay | Admitting: Cardiology

## 2017-08-21 VITALS — BP 102/68 | HR 61 | Ht 67.0 in | Wt 168.6 lb

## 2017-08-21 DIAGNOSIS — E785 Hyperlipidemia, unspecified: Secondary | ICD-10-CM | POA: Diagnosis not present

## 2017-08-21 DIAGNOSIS — I25118 Atherosclerotic heart disease of native coronary artery with other forms of angina pectoris: Secondary | ICD-10-CM

## 2017-08-21 DIAGNOSIS — I5022 Chronic systolic (congestive) heart failure: Secondary | ICD-10-CM

## 2017-08-21 NOTE — Patient Instructions (Signed)
Medication Instructions:  Your physician recommends that you continue on your current medications as directed. Please refer to the Current Medication list given to you today.  * If you need a refill on your cardiac medications before your next appointment, please call your pharmacy. *  Labwork: None ordered  Testing/Procedures: Your physician has requested that you have an echocardiogram in 3 months (before follow up with Dr. Curt Bears). Echocardiography is a painless test that uses sound waves to create images of your heart. It provides your doctor with information about the size and shape of your heart and how well your heart's chambers and valves are working. This procedure takes approximately one hour. There are no restrictions for this procedure.  Follow-Up: Your physician recommends that you schedule a follow-up appointment in: 3 months with Dr. Curt Bears.  (after the echocardiogram has been completed)   Thank you for choosing [facility]!!   Trinidad Curet, RN [phone]

## 2017-08-21 NOTE — Progress Notes (Signed)
Electrophysiology Office Note   Date:  08/21/2017   ID:  Blake [name], DOB 1956/08/29, MRN [id number]  PCP:  Leonie Man, MD  Cardiologist:  Blake [name] Primary Electrophysiologist:  Blake [name] Blake Leeds, MD    Chief Complaint  Patient presents with  . Advice Only    Ischemic cardiomyopathy/Chronic combined systolic and diastolic HF     History of Present Illness: Blake [name] is a 61 y.o. male who is being seen today for the evaluation of CHF at the request of Blake [name], Blake Lacks, MD. Presenting today for electrophysiology evaluation.  He has a history of coronary disease status post anterior STEMI with LAD PCI and ischemic cardiomyopathy.  Most recent echo showed an ejection fraction of 25-30%.  He had a cardiac MRI that showed extensive anterior scar transmural, as well as an ejection fraction of 32%.  He has been working with cardiac rehab since his MI.  He walks for an hour a day without symptoms.  He does not have PND or orthopnea.  He does not endorse exertional dyspnea.  He feels well without complaint.    Today, he denies symptoms of palpitations, chest pain, shortness of breath, orthopnea, PND, lower extremity edema, claudication, dizziness, presyncope, syncope, bleeding, or neurologic sequela. The patient is tolerating medications without difficulties.    Past Medical History:  Diagnosis Date  . CAD (coronary artery disease), native coronary artery    a. 01/2017: anterior STEMI with 100% occlusion of the prox-mid LAD with 95% distal LAD stenosis due to distal embolization of thrombus. A Synergy DES was placed to the prox-LAD.  Marland Kitchen Cancer [facility]) 2009   brain tumor  . Hyperlipemia   . Ischemic cardiomyopathy    a. 01/2017: initial echo showed a reduced EF of 30-35% with repeat echo of 40-45%. -->  However follow-up echo showed EF of 30-35%, EF confirmed to be 30% by MRI October 2018.  Marland Kitchen LUMBAR RADICULOPATHY, RIGHT 07/02/2008   Qualifier: Diagnosis of  By: Blake Marion MD,  Blake [name] [name] loss 07/05/2008   Qualifier: Diagnosis of  By: Blake Marion MD, Blake 07/05/2008   Qualifier: Diagnosis of  By: Blake Marion MD, Blake [name] Neoplasm of unspecified nature of brain 08/18/2008   Qualifier: Diagnosis of  By: Blake Marion MD, Blake [name] Rash and other nonspecific skin eruption 08/18/2008   Qualifier: Diagnosis of  By: Blake [name]    . ST elevation myocardial infarction (STEMI) of anterior wall ([place]) 01/2017   Large anterior infarct.  . TOBACCO USER 11/28/2009   Qualifier: Diagnosis of  By: Blake Marion MD, Blake [name]   . VITAMIN D DEFICIENCY 07/02/2008   Qualifier: Diagnosis of  By: Plotnikov MD, Blake [name]    Past Surgical History:  Procedure Laterality Date  . CORONARY STENT INTERVENTION N/A 02/23/2017   Procedure: Coronary Stent Intervention;  Surgeon: Leonie Man, MD;  Location: [place] CV LAB;  Service: Cardiovascular;  Laterality: N/A;  . CORONARY/GRAFT ACUTE MI REVASCULARIZATION N/A 02/23/2017   Procedure: Coronary/Graft Acute MI Revascularization;  Surgeon: Leonie Man, MD;  Location: [place] CV LAB;  Service: Cardiovascular;  Laterality: N/A;  . CRANIECTOMY / [place] TUMOR  2009   surgery and XRT at [facility]  . LEFT HEART CATH AND CORONARY ANGIOGRAPHY N/A 02/23/2017   Procedure: Left Heart Cath and Coronary Angiography;  Surgeon: Leonie Man, MD;  Location: [place] CV LAB;  Service: Cardiovascular;  Laterality:  N/A;     Current Outpatient Medications  Medication Sig Dispense Refill  . aspirin 81 MG chewable tablet Chew 1 tablet (81 mg total) by mouth daily.    Marland Kitchen atorvastatin (LIPITOR) 40 MG tablet Take 1 tablet (40 mg total) by mouth daily. 90 tablet 3  . carvedilol (COREG) 3.125 MG tablet Take 1 tablet (3.125 mg total) by mouth 2 (two) times daily with a meal. 180 tablet 3  . Cholecalciferol (VITAMIN D3) 2000 units capsule Take 1 capsule (2,000 Units total) by mouth daily. 100  capsule 3  . losartan (COZAAR) 25 MG tablet Take 1 tablet (25 mg total) daily by mouth. 90 tablet 3  . meloxicam (MOBIC) 7.5 MG tablet Take 7.5 mg by mouth daily as needed. AS NEEDED FOR MODERATE PAIN  3  . nitroGLYCERIN (NITROSTAT) 0.4 MG SL tablet Place 1 tablet (0.4 mg total) under the tongue every 5 (five) minutes as needed. 25 tablet 3  . ticagrelor (BRILINTA) 90 MG TABS tablet Take 1 tablet (90 mg total) by mouth 2 (two) times daily. 180 tablet 3   No current facility-administered medications for this visit.     Allergies:   Patient has no known allergies.   Social History:  The patient  reports that he quit smoking about 4 months ago. His smoking use included cigarettes. He smoked 0.07 packs per day. he has never used smokeless tobacco. He reports that he does not drink alcohol or use drugs.   Family History:  The patient's family history includes Alzheimer's disease in his father; Arthritis in his mother; Goiter in his mother; Hypertension in his other; Parkinsonism in his father.    ROS:  Please see the history of present illness.   Otherwise, review of systems is positive for none.   All other systems are reviewed and negative.    PHYSICAL EXAM: VS:  BP 102/68   Pulse 61   Ht 5\' 7"  (1.702 m)   Wt 168 lb 9.6 oz (76.5 kg)   BMI 26.41 kg/m  , BMI Body mass index is 26.41 kg/m. GEN: Well nourished, well developed, in no acute distress  HEENT: normal  Neck: no JVD, carotid bruits, or masses Cardiac: RRR; no murmurs, rubs, or gallops,no edema  Respiratory:  clear to auscultation bilaterally, normal work of breathing GI: soft, nontender, nondistended, + BS MS: no deformity or atrophy  Skin: warm and dry Neuro:  Strength and sensation are intact Psych: euthymic mood, full affect  EKG:  EKG is ordered today. Personal review of the ekg ordered shows sinus rhythm, rate 61, anterior septal Q waves, iRBBB  Recent Labs: 02/23/2017: Magnesium 1.9; TSH 0.521 02/26/2017: Hemoglobin  13.6; Platelets 211 06/26/2017: ALT 28; BUN 14; Creatinine, Ser 0.86; Potassium 4.9; Sodium 141    Lipid Panel     Component Value Date/Time   CHOL 102 06/26/2017 1054   TRIG 68 06/26/2017 1054   HDL 38 (L) 06/26/2017 1054   CHOLHDL 2.7 06/26/2017 1054   CHOLHDL 4.0 02/23/2017 1821   VLDL 14 02/23/2017 1821   LDLCALC 50 06/26/2017 1054   LDLDIRECT 161.0 05/14/2012 0739     Wt Readings from Last 3 Encounters:  08/21/17 168 lb 9.6 oz (76.5 kg)  08/08/17 174 lb (78.9 kg)  06/19/17 168 lb 9.6 oz (76.5 kg)      Other studies Reviewed: Additional studies/ records that were reviewed today include: TTE 06/06/17  Review of the above records today demonstrates:  - Left ventricle: The cavity size was  normal. Systolic function was   severely reduced. The estimated ejection fraction was in the   range of 25% to 30%. Akinesis and scarring of the anteroseptal,   anterior, and apical myocardium; in the distribution of the left   anterior descending coronary artery. Features are consistent with   a pseudonormal left ventricular filling pattern, with concomitant   abnormal relaxation and increased filling pressure (grade 2   diastolic dysfunction). No evidence of thrombus. - Left atrium: The atrium was mildly dilated.  CMRI 07/03/17 1) Moderate LVE with evidence for large previous anterior MI EF 32%  2) Delayed gadolinium images show full thickness scar involving the mid and distal anterior wall, septum apex and inferior apex  3) Mild LAE  4) Normal RV  The patient would appear to have a large substrate for re entrant VT and be a candidate for AICD  ASSESSMENT AND PLAN:  1.  Chronic combined systolic and diastolic heart failure due to ischemic cardiomyopathy: Currently on carvedilol, though heart rate has limited titration.  Was recently put on losartan on 08/08/17.  I did discuss with him options of ICD implantation.  Risks and benefits discussed.  Risks include bleeding,  tamponade, infection, pneumothorax, among others.  I did offer the patient defibrillator implantation, but he would like to wait until spring, as he has plans to travel to [place].  We Vernel Langenderfer see him back in 3 months and plan for an echocardiogram at that time to further evaluate his ejection fraction.  I imagine that he Kalandra Masters need an ICD at that time.  2.  Coronary artery disease status post anterior STEMI: Status post drug-eluting stent.  Unfortunately he does have a large scar transmural based on cardiac MRI.  Currently no chest pain.  Continue dual antiplatelets.  3.  Hyperlipidemia: LDL goal less than 70.  Continue atorvastatin per primary cardiology    Current medicines are reviewed at length with the patient today.   The patient does not have concerns regarding his medicines.  The following changes were made today:  none  Labs/ tests ordered today include:  Orders Placed This Encounter  Procedures  . EKG 12-Lead  . ECHOCARDIOGRAM COMPLETE     Disposition:   FU with Levent Kornegay 3 months  Signed, Ramond Darnell Blake Leeds, MD  08/21/2017 9:55 AM     [facility] [address] [place] [address] [phone] (office) [phone] (fax)

## 2017-08-27 ENCOUNTER — Telehealth (HOSPITAL_COMMUNITY): Payer: Self-pay

## 2017-08-27 NOTE — Telephone Encounter (Signed)
Called and spoke with patient in regards to Cardiac Rehab - Patient will be leaving out of the country in December and returning 10/10/2017. Will call the week of the 10th to schedule patient as he is interested in the program.

## 2017-09-05 ENCOUNTER — Encounter: Payer: Self-pay | Admitting: Physician Assistant

## 2017-09-05 ENCOUNTER — Ambulatory Visit (INDEPENDENT_AMBULATORY_CARE_PROVIDER_SITE_OTHER): Payer: 59 | Admitting: Physician Assistant

## 2017-09-05 VITALS — BP 112/70 | HR 67 | Ht 67.0 in | Wt 173.0 lb

## 2017-09-05 DIAGNOSIS — I255 Ischemic cardiomyopathy: Secondary | ICD-10-CM

## 2017-09-05 DIAGNOSIS — E785 Hyperlipidemia, unspecified: Secondary | ICD-10-CM | POA: Diagnosis not present

## 2017-09-05 DIAGNOSIS — I2510 Atherosclerotic heart disease of native coronary artery without angina pectoris: Secondary | ICD-10-CM

## 2017-09-05 DIAGNOSIS — I420 Dilated cardiomyopathy: Secondary | ICD-10-CM

## 2017-09-05 DIAGNOSIS — Z79899 Other long term (current) drug therapy: Secondary | ICD-10-CM | POA: Diagnosis not present

## 2017-09-05 MED ORDER — SACUBITRIL-VALSARTAN 49-51 MG PO TABS: 1.0000 | ORAL_TABLET | Freq: Two times a day (BID) | ORAL | 2 refills | Status: DC

## 2017-09-05 NOTE — Patient Instructions (Signed)
Medication Instructions:  STOP LOSARTAN  START ENTRESTO 49/51MG -CO-PAY CARD If you need a refill on your cardiac medications before your next appointment, please call your pharmacy.  Labwork: FASTING LIPID PANEL AND BMET IN ONE WEEK HERE IN OUR OFFICE AT [facility]  Take the provided lab slips for you to take with you to the lab for you blood draw.   You will need to fast. DO NOT EAT OR DRINK PAST MIDNIGHT.   You may go to any [facility] lab that is convenient for you however, we do have a lab in our office that is able to assist you. You do NOT need an appointment for our lab. Once in our office lobby there is a podium to the right of the check-in desk where you are to sign-in and ring a doorbell to alert [place] you are here. Lab is open Monday-Friday from 8:00am to 4:00pm; and is closed for lunch from 12:45p-1:45pm   Follow-Up: Your physician wants you to follow-up in: IN January WITH [facility] PA-C, AND AS SCHEDULED IN February 2019 WITH DR [facility].   Thank you for choosing [facility] at [facility]!!

## 2017-09-05 NOTE — Progress Notes (Signed)
Cardiology Office Note    Date:  09/05/2017   ID:  Blake [name], DOB June 23, 1956, MRN [id number]  PCP:  Blake Man, MD  Cardiologist:  Dr. Ellyn Hack Electrophysiologist: Dr. Curt Bears  Chief Complaint  Patient presents with  . Follow-up    seen for Dr. Ellyn Hack, medication adjustment    History of Present Illness:  Blake [name] is a 61 y.o. male with PMH of HLD, ICM and CAD.  Patient initially suffered massive anterior MI with 100% proximal to mid LAD occlusion on 02/23/2017, he received a synergy 3 x 24 mm DES to the LAD, he continued to have 95% distal LAD embolization.  EF was 30-35% at the time was akinesis of the mid anterior, anteroseptal, inferoseptal and apical anterior region.  Repeat echocardiogram on 02/26/2017 however showed EF improved to 40-45%.  55-month follow-up echo obtained on 06/06/2017 however showed EF went down again to 25-30%.  Cardiac MRI obtained on 07/02/2017 showed EF of 32%, delayed gadolinium image showed a full-thickness scar involving the mid and distal anterior wall, septum and apex and the inferior apex.  Patient with appears to have a large substrate for reentrant VT and a candidate for AICD.  He was subsequently referred to Dr. Curt Bears for evaluation for ICD placement.  Dr. Curt Bears did discuss with the patient regarding ICD placement, patient wishes to delay this until spring of next year.  During in the meantime, Dr. Ellyn Hack has started the patient on losartan during the last office visit with the intention of starting Entresto.  He returns today for follow-up, he denies any significant discomfort after starting losartan.  He denies any recent chest pain.  He continued to have dyspnea with extreme activities, however not with every day activity.  He does not have any lower extremity edema, orthopnea or PND.  His blood pressure stable on low-dose carvedilol and losartan.  I have discontinued his losartan today, he will start on moderate Entresto tomorrow morning.   He is going back to [place] from December 16 until January 7.  Prior to him leaving, I recommended a fasting lipid panel and a basic metabolic panel in 1 week.  Once he returns, I will see him again for up titration of Entresto.   Past Medical History:  Diagnosis Date  . CAD (coronary artery disease), native coronary artery    a. 01/2017: anterior STEMI with 100% occlusion of the prox-mid LAD with 95% distal LAD stenosis due to distal embolization of thrombus. A Synergy DES was placed to the prox-LAD.  Marland Kitchen Cancer [facility]) 2009   brain tumor  . Hyperlipemia   . Ischemic cardiomyopathy    a. 01/2017: initial echo showed a reduced EF of 30-35% with repeat echo of 40-45%. -->  However follow-up echo showed EF of 30-35%, EF confirmed to be 30% by MRI October 2018.  Marland Kitchen LUMBAR RADICULOPATHY, RIGHT 07/02/2008   Qualifier: Diagnosis of  By: Alain Marion MD, Evie Lacks Memory loss 07/05/2008   Qualifier: Diagnosis of  By: Alain Marion MD, [place] 07/05/2008   Qualifier: Diagnosis of  By: Alain Marion MD, Evie Lacks Neoplasm of unspecified nature of brain 08/18/2008   Qualifier: Diagnosis of  By: Alain Marion MD, Evie Lacks Rash and other nonspecific skin eruption 08/18/2008   Qualifier: Diagnosis of  By: Doralee Albino    . ST elevation myocardial infarction (STEMI) of anterior wall ([place]) 01/2017   Large anterior infarct.  . TOBACCO USER  11/28/2009   Qualifier: Diagnosis of  By: Plotnikov MD, Evie Lacks   . VITAMIN D DEFICIENCY 07/02/2008   Qualifier: Diagnosis of  By: Plotnikov MD, Evie Lacks     Past Surgical History:  Procedure Laterality Date  . CORONARY STENT INTERVENTION N/A 02/23/2017   Procedure: Coronary Stent Intervention;  Surgeon: Blake Man, MD;  Location: [place] [facility] LAB;  Service: Cardiovascular;  Laterality: N/A;  . CORONARY/GRAFT ACUTE MI REVASCULARIZATION N/A 02/23/2017   Procedure: Coronary/Graft Acute MI Revascularization;  Surgeon: Blake Man, MD;   Location: [place] [facility];  Service: Cardiovascular;  Laterality: N/A;  . CRANIECTOMY / [place] TUMOR  2009   surgery and XRT at [facility]  . LEFT HEART CATH AND CORONARY ANGIOGRAPHY N/A 02/23/2017   Procedure: Left Heart Cath and Coronary Angiography;  Surgeon: Blake Man, MD;  Location: [place] CV LAB;  Service: Cardiovascular;  Laterality: N/A;    Current Medications: Outpatient Medications Prior to Visit  Medication Sig Dispense Refill  . aspirin 81 MG chewable tablet Chew 1 tablet (81 mg total) by mouth daily.    Marland Kitchen atorvastatin (LIPITOR) 40 MG tablet Take 1 tablet (40 mg total) by mouth daily. 90 tablet 3  . carvedilol (COREG) 3.125 MG tablet Take 1 tablet (3.125 mg total) by mouth 2 (two) times daily with a meal. 180 tablet 3  . Cholecalciferol (VITAMIN D3) 2000 units capsule Take 1 capsule (2,000 Units total) by mouth daily. 100 capsule 3  . meloxicam (MOBIC) 7.5 MG tablet Take 7.5 mg by mouth daily as needed. AS NEEDED FOR MODERATE PAIN  3  . nitroGLYCERIN (NITROSTAT) 0.4 MG SL tablet Place 1 tablet (0.4 mg total) under the tongue every 5 (five) minutes as needed. 25 tablet 3  . ticagrelor (BRILINTA) 90 MG TABS tablet Take 1 tablet (90 mg total) by mouth 2 (two) times daily. 180 tablet 3  . losartan (COZAAR) 25 MG tablet Take 1 tablet (25 mg total) daily by mouth. 90 tablet 3   No facility-administered medications prior to visit.      Allergies:   Patient has no known allergies.   Social History   Socioeconomic History  . Marital status: Married    Spouse name: None  . Number of children: 1  . Years of education: None  . Highest education level: None  Social Needs  . Financial resource strain: None  . Food insecurity - worry: None  . Food insecurity - inability: None  . Transportation needs - medical: None  . Transportation needs - non-medical: None  Occupational History  . Occupation: Lobbyist: [employer]  Tobacco  Use  . Smoking status: Former Smoker    Packs/day: 0.07    Types: Cigarettes    Last attempt to quit: 03/31/2017    Years since quitting: 0.4  . Smokeless tobacco: Never Used  Substance and Sexual Activity  . Alcohol use: No  . Drug use: No  . Sexual activity: Yes  Other Topics Concern  . None  Social History Narrative   Very active. 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 He also walks daily after work for at least 30-45 minutes. On weekends he tries to walk at least an hour. This is usually a fast walk or slow jog.      He quit smoking shortly after his MI.     Family History:  The patient's family history includes Alzheimer's disease in his  father; Arthritis in his mother; Goiter in his mother; Hypertension in his other; Parkinsonism in his father.   ROS:   Please see the history of present illness.    ROS All other systems reviewed and are negative.   PHYSICAL EXAM:   VS:  BP 112/70   Pulse 67   Ht 5\' 7"  (1.702 m)   Wt 173 lb (78.5 kg)   BMI 27.10 kg/m    GEN: Well nourished, well developed, in no acute distress  HEENT: normal  Neck: no JVD, carotid bruits, or masses Cardiac: RRR; no murmurs, rubs, or gallops,no edema  Respiratory:  clear to auscultation bilaterally, normal work of breathing GI: soft, nontender, nondistended, + BS MS: no deformity or atrophy  Skin: warm and dry, no rash Neuro:  Alert and Oriented x 3, Strength and sensation are intact Psych: euthymic mood, full affect  Wt Readings from Last 3 Encounters:  09/05/17 173 lb (78.5 kg)  08/21/17 168 lb 9.6 oz (76.5 kg)  08/08/17 174 lb (78.9 kg)      Studies/Labs Reviewed:   EKG:  EKG is not ordered today.   Recent Labs: 02/23/2017: Magnesium 1.9; TSH 0.521 02/26/2017: Hemoglobin 13.6; Platelets 211 06/26/2017: ALT 28; BUN 14; Creatinine, Ser 0.86; Potassium 4.9; Sodium 141   Lipid Panel    Component Value Date/Time   CHOL 102 06/26/2017 1054   TRIG 68 06/26/2017 1054   HDL 38 (L) 06/26/2017  1054   CHOLHDL 2.7 06/26/2017 1054   CHOLHDL 4.0 02/23/2017 1821   VLDL 14 02/23/2017 1821   LDLCALC 50 06/26/2017 1054   LDLDIRECT 161.0 05/14/2012 0739    Additional studies/ records that were reviewed today include:    Cath 02/23/2017 Conclusion    There is moderate to severe left ventricular systolic dysfunction.  The left ventricular ejection fraction is 35-45% by visual estimate.  LV end diastolic pressure is severely elevated.  Mid Cx lesion, 30 %stenosed.  -----CULPRIT LESION-------  Prox LAD to Mid LAD lesion, 100 %stenosed.  A STENT SYNERGY DES 3X24 drug eluting stent was successfully placed.  Post intervention, there is a 0% residual stenosis.  Dist LAD lesion, 95 %stenosed - distal embolization of thrombus to the small caliber apical LAD.       Echo 06/06/2017 LV EF: 25% -   30%  Study Conclusions  - Left ventricle: The cavity size was normal. Systolic function was   severely reduced. The estimated ejection fraction was in the   range of 25% to 30%. Akinesis and scarring of the anteroseptal,   anterior, and apical myocardium; in the distribution of the left   anterior descending coronary artery. Features are consistent with   a pseudonormal left ventricular filling pattern, with concomitant   abnormal relaxation and increased filling pressure (grade 2   diastolic dysfunction). No evidence of thrombus. - Left atrium: The atrium was mildly dilated.    Cardiac MRI 07/02/2017 IMPRESSION: 1) Moderate LVE with evidence for large previous anterior MI EF 32%  2) Delayed gadolinium images show full thickness scar involving the mid and distal anterior wall, septum apex and inferior apex  3) Mild LAE  4) Normal RV  The patient would appear to have a large substrate for re entrant VT and be a candidate for AICD    ASSESSMENT:    1. Coronary artery disease involving native coronary artery of native heart without angina pectoris   2. Medication  management   3. Ischemic dilated cardiomyopathy ([place])   4.  Hyperlipidemia, unspecified hyperlipidemia type      PLAN:  In order of problems listed above:  1. CAD: PCI to proximal LAD.  Continue aspirin and Brilinta.  Continue high-dose statin.  On low-dose carvedilol, unable to uptitrate due to bradycardia.  2. Ischemic cardiomyopathy: Although EF initially improved to 40-45% after MRI, however more recent echocardiogram showed EF down to 30%.  Continue carvedilol, he was started on losartan during the last office visit.  I will transition the losartan to moderate dose of Entresto.  He will need 1 week basic metabolic panel.  He has been seen by Dr. Curt Bears, once his heart failure medications fully titrated, he will need a 41-month repeat echocardiogram.  If EF remains low, he will need ICD.  He denies any recent dizziness or feeling of passing out.  3. Hyperlipidemia: Continue moderate dose Lipitor.  His Lipitor was decreased to 40 mg daily after the last lipid panel.  I will repeat a fasting lipid panel next week along with his BMET    Medication Adjustments/Labs and Tests Ordered: Current medicines are reviewed at length with the patient today.  Concerns regarding medicines are outlined above.  Medication changes, Labs and Tests ordered today are listed in the Patient Instructions below. Patient Instructions  Medication Instructions:  STOP LOSARTAN  START ENTRESTO 49/51MG -CO-PAY CARD If you need a refill on your cardiac medications before your next appointment, please call your pharmacy.  Labwork: FASTING LIPID PANEL AND BMET IN ONE WEEK HERE IN OUR OFFICE AT [facility]  Take the provided lab slips for you to take with you to the lab for you blood draw.   You will need to fast. DO NOT EAT OR DRINK PAST MIDNIGHT.   You may go to any [facility] lab that is convenient for you however, we do have a lab in our office that is able to assist you. You do NOT need an appointment for our lab. Once  in our office lobby there is a podium to the right of the check-in desk where you are to sign-in and ring a doorbell to alert [place] you are here. Lab is open Monday-Friday from 8:00am to 4:00pm; and is closed for lunch from 12:45p-1:45pm   Follow-Up: Your physician wants you to follow-up in: IN January WITH Larenzo Caples [facility] PA-C, AND AS SCHEDULED IN February 2019 WITH DR [facility]   Thank you for choosing [facility] at [facility], [place], [place]  09/05/2017 8:57 AM    [address], [place], [place]  [address] Phone: [phone]; Fax: [phone]

## 2017-09-11 DIAGNOSIS — Z79899 Other long term (current) drug therapy: Secondary | ICD-10-CM | POA: Diagnosis not present

## 2017-09-11 LAB — LIPID PANEL
CHOLESTEROL TOTAL: 122 mg/dL (ref 100–199)
Chol/HDL Ratio: 2.8 ratio (ref 0.0–5.0)
HDL: 44 mg/dL (ref >39)
LDL Calculated: 61 mg/dL (ref 0–99)
Triglycerides: 83 mg/dL (ref 0–149)
VLDL Cholesterol Cal: 17 mg/dL (ref 5–40)

## 2017-09-11 LAB — BASIC METABOLIC PANEL
BUN/Creatinine Ratio: 16 (ref 9–20)
BUN: 14 mg/dL (ref 6–24)
CALCIUM: 9.7 mg/dL (ref 8.7–10.2)
CO2: 25 mmol/L (ref 20–29)
Chloride: 104 mmol/L (ref 96–106)
Creatinine, Ser: 0.88 mg/dL (ref 0.76–1.27)
GFR calc Af Amer: 109 mL/min/{1.73_m2} (ref >59)
GFR, EST NON AFRICAN AMERICAN: 94 mL/min/{1.73_m2} (ref >59)
Glucose: 114 mg/dL — ABNORMAL HIGH (ref 65–99)
POTASSIUM: 4.9 mmol/L (ref 3.5–5.2)
Sodium: 143 mmol/L (ref 134–144)

## 2017-10-09 ENCOUNTER — Encounter: Payer: Self-pay | Admitting: Physician Assistant

## 2017-10-09 ENCOUNTER — Ambulatory Visit: Payer: 59 | Admitting: Physician Assistant

## 2017-10-09 VITALS — BP 103/65 | HR 75 | Ht 67.0 in | Wt 172.2 lb

## 2017-10-09 DIAGNOSIS — I2510 Atherosclerotic heart disease of native coronary artery without angina pectoris: Secondary | ICD-10-CM | POA: Diagnosis not present

## 2017-10-09 DIAGNOSIS — I255 Ischemic cardiomyopathy: Secondary | ICD-10-CM | POA: Diagnosis not present

## 2017-10-09 DIAGNOSIS — E785 Hyperlipidemia, unspecified: Secondary | ICD-10-CM | POA: Diagnosis not present

## 2017-10-09 NOTE — Progress Notes (Signed)
Cardiology Office Note    Date:  10/11/2017   ID:  Blake [name], DOB 02-06-1956, MRN [id number]  PCP:  Cassandria Anger, MD  Cardiologist:   Dr. Ellyn Hack Electrophysiologist: Dr. Curt Bears  Chief Complaint  Patient presents with  . Follow-up    seen for Dr. Ellyn Hack, titration of entresto    History of Present Illness:  Blake [name] is a 62 y.o. male with PMH of HLD, ICM and CAD.  Patient initially suffered massive anterior MI with 100% proximal to mid LAD occlusion on 02/23/2017, he received a synergy 3 x 24 mm DES to the LAD, he continued to have 95% distal LAD embolization.  EF was 30-35% at the time was akinesis of the mid anterior, anteroseptal, inferoseptal and apical anterior region.  Repeat echocardiogram on 02/26/2017 however showed EF improved to 40-45%.  48-month follow-up echo obtained on 06/06/2017 however showed EF went down again to 25-30%.  Cardiac MRI obtained on 07/02/2017 showed EF of 32%, delayed gadolinium image showed a full-thickness scar involving the mid and distal anterior wall, septum and apex and the inferior apex.  Patient with appears to have a large substrate for reentrant VT and a candidate for AICD.  He was subsequently referred to Dr. Curt Bears for evaluation for ICD placement.  Dr. Curt Bears did discuss with the patient regarding ICD placement, patient wishes to delay this until spring of next year.  During in the meantime, Dr. Ellyn Hack has started the patient on losartan during the last office visit with the intention of starting Entresto.  Patient presents today for cardiology office visit.  He has been doing well since the last time I saw him.  He just recently returned from a trip to [place].  He was able to ambulate overseas without any exertional chest discomfort or shortness of breath.  He does not have any heart failure signs on physical exam.  He has no lower extremity edema, orthopnea or paroxysmal nocturnal dyspnea.  He seems to be tolerating Entresto quite  well, lab work in mid December shows stable renal function and electrolyte.  His blood pressure today is 103/65, he does not have any dizziness associated with his blood pressure, however I am unable to [employer] any further.  I will continue on the current dose of Entresto, since I started on Entresto on December 6, I plan to delay the echocardiogram until March 6.  He can follow-up with Dr. Ellyn Hack after that and the Dr. Curt Bears in late March.    Past Medical History:  Diagnosis Date  . CAD (coronary artery disease), native coronary artery    a. 01/2017: anterior STEMI with 100% occlusion of the prox-mid LAD with 95% distal LAD stenosis due to distal embolization of thrombus. A Synergy DES was placed to the prox-LAD.  Marland Kitchen Cancer [facility]) 2009   brain tumor  . Hyperlipemia   . Ischemic cardiomyopathy    a. 01/2017: initial echo showed a reduced EF of 30-35% with repeat echo of 40-45%. -->  However follow-up echo showed EF of 30-35%, EF confirmed to be 30% by MRI October 2018.  Marland Kitchen LUMBAR RADICULOPATHY, RIGHT 07/02/2008   Qualifier: Diagnosis of  By: Alain Marion MD, Evie Lacks Memory loss 07/05/2008   Qualifier: Diagnosis of  By: Alain Marion MD, [place] 07/05/2008   Qualifier: Diagnosis of  By: Alain Marion MD, Evie Lacks   . Neoplasm of unspecified nature of brain 08/18/2008   Qualifier: Diagnosis of  By: [facility]  MD, Evie Lacks   . Rash and other nonspecific skin eruption 08/18/2008   Qualifier: Diagnosis of  By: Doralee Albino    . ST elevation myocardial infarction (STEMI) of anterior wall ([place]) 01/2017   Large anterior infarct.  . TOBACCO USER 11/28/2009   Qualifier: Diagnosis of  By: Alain Marion MD, Evie Lacks   . VITAMIN D DEFICIENCY 07/02/2008   Qualifier: Diagnosis of  By: Plotnikov MD, Evie Lacks     Past Surgical History:  Procedure Laterality Date  . CORONARY STENT INTERVENTION N/A 02/23/2017   Procedure: Coronary Stent Intervention;  Surgeon: Leonie Man,  MD;  Location: [place] [facility];  Service: Cardiovascular;  Laterality: N/A;  . CORONARY/GRAFT ACUTE MI REVASCULARIZATION N/A 02/23/2017   Procedure: Coronary/Graft Acute MI Revascularization;  Surgeon: Leonie Man, MD;  Location: [place] [facility];  Service: Cardiovascular;  Laterality: N/A;  . CRANIECTOMY / [place] TUMOR  2009   surgery and XRT at [facility]  . LEFT HEART CATH AND CORONARY ANGIOGRAPHY N/A 02/23/2017   Procedure: Left Heart Cath and Coronary Angiography;  Surgeon: Leonie Man, MD;  Location: [place] [facility];  Service: Cardiovascular;  Laterality: N/A;    Current Medications: Outpatient Medications Prior to Visit  Medication Sig Dispense Refill  . aspirin 81 MG chewable tablet Chew 1 tablet (81 mg total) by mouth daily.    Marland Kitchen atorvastatin (LIPITOR) 40 MG tablet Take 1 tablet (40 mg total) by mouth daily. 90 tablet 3  . carvedilol (COREG) 3.125 MG tablet Take 1 tablet (3.125 mg total) by mouth 2 (two) times daily with a meal. 180 tablet 3  . Cholecalciferol (VITAMIN D3) 2000 units capsule Take 1 capsule (2,000 Units total) by mouth daily. 100 capsule 3  . meloxicam (MOBIC) 7.5 MG tablet Take 7.5 mg by mouth daily as needed. AS NEEDED FOR MODERATE PAIN  3  . nitroGLYCERIN (NITROSTAT) 0.4 MG SL tablet Place 1 tablet (0.4 mg total) under the tongue every 5 (five) minutes as needed. 25 tablet 3  . sacubitril-valsartan (ENTRESTO) 49-51 MG Take 1 tablet by mouth 2 (two) times daily. 60 tablet 2  . ticagrelor (BRILINTA) 90 MG TABS tablet Take 1 tablet (90 mg total) by mouth 2 (two) times daily. 180 tablet 3   No facility-administered medications prior to visit.      Allergies:   Patient has no known allergies.   Social History   Socioeconomic History  . Marital status: Married    Spouse name: None  . Number of children: 1  . Years of education: None  . Highest education level: None  Social Needs  . Financial resource strain: None  . Food  insecurity - worry: None  . Food insecurity - inability: None  . Transportation needs - medical: None  . Transportation needs - non-medical: None  Occupational History  . Occupation: Lobbyist: [employer]  Tobacco Use  . Smoking status: Former Smoker    Packs/day: 0.07    Types: Cigarettes    Last attempt to quit: 03/31/2017    Years since quitting: 0.5  . Smokeless tobacco: Never Used  Substance and Sexual Activity  . Alcohol use: No  . Drug use: No  . Sexual activity: Yes  Other Topics Concern  . None  Social History Narrative   Very active. 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 He also walks daily after work for at least 30-45 minutes. On weekends he  tries to walk at least an hour. This is usually a fast walk or slow jog.      He quit smoking shortly after his MI.     Family History:  The patient's family history includes Alzheimer's disease in his father; Arthritis in his mother; Goiter in his mother; Hypertension in his other; Parkinsonism in his father.   ROS:   Please see the history of present illness.    ROS All other systems reviewed and are negative.   PHYSICAL EXAM:   VS:  BP 103/65   Pulse 75   Ht 5\' 7"  (1.702 m)   Wt 172 lb 3.2 oz (78.1 kg)   BMI 26.97 kg/m    GEN: Well nourished, well developed, in no acute distress  HEENT: normal  Neck: no JVD, carotid bruits, or masses Cardiac: RRR; no murmurs, rubs, or gallops,no edema  Respiratory:  clear to auscultation bilaterally, normal work of breathing GI: soft, nontender, nondistended, + BS MS: no deformity or atrophy  Skin: warm and dry, no rash Neuro:  Alert and Oriented x 3, Strength and sensation are intact Psych: euthymic mood, full affect  Wt Readings from Last 3 Encounters:  10/09/17 172 lb 3.2 oz (78.1 kg)  09/05/17 173 lb (78.5 kg)  08/21/17 168 lb 9.6 oz (76.5 kg)      Studies/Labs Reviewed:   EKG:  EKG is not ordered today.    Recent Labs: 02/23/2017: Magnesium 1.9;  TSH 0.521 02/26/2017: Hemoglobin 13.6; Platelets 211 06/26/2017: ALT 28 09/11/2017: BUN 14; Creatinine, Ser 0.88; Potassium 4.9; Sodium 143   Lipid Panel    Component Value Date/Time   CHOL 122 09/11/2017 1149   TRIG 83 09/11/2017 1149   HDL 44 09/11/2017 1149   CHOLHDL 2.8 09/11/2017 1149   CHOLHDL 4.0 02/23/2017 1821   VLDL 14 02/23/2017 1821   LDLCALC 61 09/11/2017 1149   LDLDIRECT 161.0 05/14/2012 0739    Additional studies/ records that were reviewed today include:   Cath 02/23/2017 Conclusion    There is moderate to severe left ventricular systolic dysfunction.  The left ventricular ejection fraction is 35-45% by visual estimate.  LV end diastolic pressure is severely elevated.  Mid Cx lesion, 30 %stenosed.  -----CULPRIT LESION-------  Prox LAD to Mid LAD lesion, 100 %stenosed.  A STENT SYNERGY DES 3X24 drug eluting stent was successfully placed.  Post intervention, there is a 0% residual stenosis.  Dist LAD lesion, 95 %stenosed - distal embolization of thrombus to the small caliber apical LAD.       Echo 06/06/2017 LV EF: 25% - 30%  Study Conclusions  - Left ventricle: The cavity size was normal. Systolic function was severely reduced. The estimated ejection fraction was in the range of 25% to 30%. Akinesis and scarring of the anteroseptal, anterior, and apical myocardium; in the distribution of the left anterior descending coronary artery. Features are consistent with a pseudonormal left ventricular filling pattern, with concomitant abnormal relaxation and increased filling pressure (grade 2 diastolic dysfunction). No evidence of thrombus. - Left atrium: The atrium was mildly dilated.    Cardiac MRI 07/02/2017 IMPRESSION: 1) Moderate LVE with evidence for large previous anterior MI EF 32%  2) Delayed gadolinium images show full thickness scar involving the mid and distal anterior wall, septum apex and inferior  apex  3) Mild LAE  4) Normal RV  The patient would appear to have a large substrate for re entrant VT and be a candidate for AICD    ASSESSMENT:  1. Coronary artery disease involving native coronary artery of native heart without angina pectoris   2. Ischemic cardiomyopathy   3. Hyperlipidemia, unspecified hyperlipidemia type      PLAN:  In order of problems listed above:  1. CAD: s/p anterior MI and PCI of LAD.  Continue aspirin and Brilinta.  2. ICM: EF by echo was 25-30%, cardiac MRI was 32%.  He wished to delay ICD placement, currently he is on carvedilol and Entresto I am unable to uptitrate his heart failure medication.  We will continue on the current medication, plan for repeat echocardiogram in early March.  3. HLD: Recent lipid panel showed a cholesterol of 122, HDL 44, LDL 61, triglyceride 83.  Well controlled.    Medication Adjustments/Labs and Tests Ordered: Current medicines are reviewed at length with the patient today.  Concerns regarding medicines are outlined above.  Medication changes, Labs and Tests ordered today are listed in the Patient Instructions below. Patient Instructions  Medication Instructions:  Continue current medications  If you need a refill on your cardiac medications before your next appointment, please call your pharmacy.  Labwork: None Ordered  Testing/Procedures: None Ordered  Follow-Up: Your physician wants you to follow-up in: March with Dr Ellyn Hack.    Thank you for choosing [facility] at [facility], [place]  10/11/2017 6:21 AM    [address], [place], [place]  [address] Phone: [phone]; Fax: [phone]

## 2017-10-09 NOTE — Patient Instructions (Signed)
Medication Instructions:  Continue current medications  If you need a refill on your cardiac medications before your next appointment, please call your pharmacy.  Labwork: None Ordered  Testing/Procedures: None Ordered  Follow-Up: Your physician wants you to follow-up in: March with Dr Ellyn Hack.    Thank you for choosing [facility] at [facility]!!

## 2017-10-11 ENCOUNTER — Telehealth (HOSPITAL_COMMUNITY): Payer: Self-pay

## 2017-10-11 ENCOUNTER — Encounter: Payer: Self-pay | Admitting: Physician Assistant

## 2017-10-11 NOTE — Telephone Encounter (Signed)
Attempted to call patient in regards to Cardiac Rehab - Lm on vm °

## 2017-10-21 ENCOUNTER — Telehealth (HOSPITAL_COMMUNITY): Payer: Self-pay

## 2017-10-21 ENCOUNTER — Encounter (HOSPITAL_COMMUNITY): Payer: Self-pay

## 2017-10-21 NOTE — Telephone Encounter (Signed)
2nd attempt to call patient in regards to cardiac rehab - lm on vm. Sending letter. °

## 2017-10-25 ENCOUNTER — Telehealth (HOSPITAL_COMMUNITY): Payer: Self-pay

## 2017-10-25 NOTE — Telephone Encounter (Signed)
Patient returned phone call in regards to cardiac rehab - patient is interested in the program. Scheduled orientation on 12/03/2017 at 1:30pm. Patient will attend the 11:15am exc class.

## 2017-11-06 ENCOUNTER — Other Ambulatory Visit: Payer: Self-pay | Admitting: Physician Assistant

## 2017-11-06 NOTE — Telephone Encounter (Signed)
Please review for refill, Thanks !  

## 2017-11-08 ENCOUNTER — Ambulatory Visit: Payer: 59 | Admitting: Cardiology

## 2017-11-18 ENCOUNTER — Other Ambulatory Visit (HOSPITAL_COMMUNITY): Payer: 59

## 2017-11-25 ENCOUNTER — Ambulatory Visit: Payer: 59 | Admitting: Cardiology

## 2017-11-29 HISTORY — PX: TRANSTHORACIC ECHOCARDIOGRAM: SHX275

## 2017-12-02 ENCOUNTER — Telehealth (HOSPITAL_COMMUNITY): Payer: Self-pay

## 2017-12-02 NOTE — Telephone Encounter (Signed)
Patient called to cancel orientation and all of exc classes due to going out of the country for work. Closed referral.

## 2017-12-03 ENCOUNTER — Other Ambulatory Visit: Payer: Self-pay

## 2017-12-03 ENCOUNTER — Ambulatory Visit (HOSPITAL_COMMUNITY): Payer: 59

## 2017-12-03 ENCOUNTER — Ambulatory Visit (HOSPITAL_COMMUNITY): Payer: 59 | Attending: Cardiovascular Disease

## 2017-12-03 DIAGNOSIS — E785 Hyperlipidemia, unspecified: Secondary | ICD-10-CM | POA: Insufficient documentation

## 2017-12-03 DIAGNOSIS — I2510 Atherosclerotic heart disease of native coronary artery without angina pectoris: Secondary | ICD-10-CM | POA: Diagnosis not present

## 2017-12-03 DIAGNOSIS — I252 Old myocardial infarction: Secondary | ICD-10-CM | POA: Diagnosis not present

## 2017-12-03 DIAGNOSIS — Z87891 Personal history of nicotine dependence: Secondary | ICD-10-CM | POA: Insufficient documentation

## 2017-12-03 DIAGNOSIS — I5022 Chronic systolic (congestive) heart failure: Secondary | ICD-10-CM

## 2017-12-03 DIAGNOSIS — I255 Ischemic cardiomyopathy: Secondary | ICD-10-CM | POA: Diagnosis not present

## 2017-12-05 ENCOUNTER — Other Ambulatory Visit (HOSPITAL_COMMUNITY): Payer: 59

## 2017-12-09 ENCOUNTER — Ambulatory Visit (HOSPITAL_COMMUNITY): Payer: 59

## 2017-12-10 ENCOUNTER — Encounter: Payer: Self-pay | Admitting: Cardiology

## 2017-12-10 ENCOUNTER — Ambulatory Visit: Payer: 59 | Admitting: Cardiology

## 2017-12-10 VITALS — BP 102/66 | HR 67 | Ht 67.0 in | Wt 168.4 lb

## 2017-12-10 DIAGNOSIS — I25110 Atherosclerotic heart disease of native coronary artery with unstable angina pectoris: Secondary | ICD-10-CM

## 2017-12-10 DIAGNOSIS — I255 Ischemic cardiomyopathy: Secondary | ICD-10-CM | POA: Diagnosis not present

## 2017-12-10 DIAGNOSIS — Z87891 Personal history of nicotine dependence: Secondary | ICD-10-CM

## 2017-12-10 DIAGNOSIS — E785 Hyperlipidemia, unspecified: Secondary | ICD-10-CM

## 2017-12-10 DIAGNOSIS — D320 Benign neoplasm of cerebral meninges: Secondary | ICD-10-CM

## 2017-12-10 MED ORDER — TICAGRELOR 60 MG PO TABS: 60.0000 mg | ORAL_TABLET | Freq: Two times a day (BID) | ORAL | 3 refills | Status: DC

## 2017-12-10 NOTE — Patient Instructions (Addendum)
MEDICATION INSTRUCTION  COMPLETE THE CURRENT BRILINTA 90 MG  ( June 2019) [place] 60 MG TWICE A DAY  NO OTHER CHANGES     Your physician wants you to follow-up in 6 months with Dr Ellyn Hack. You will receive a reminder letter in the mail two months in advance. If you don't receive a letter, please call our office to schedule the follow-up appointment.   If you need a refill on your cardiac medications before your next appointment, please call your pharmacy.

## 2017-12-10 NOTE — Progress Notes (Signed)
PCP: Plotnikov, Evie Lacks, MD  Clinic Note: Chief Complaint  Patient presents with  . Follow-up  . Coronary Artery Disease    Large anterior STEMI  . Cardiomyopathy    Anterior status post LAD STEMI with delayed presentation    HPI: Blake [name] is a 62 y.o. male with a PMH below who presents today for 2 month f/u for CAD-Ischemic CM.   Blake Lobato was last seen on Oct 09 2017 by Almyra Deforest, [place] He had just recently returned from a trip to [place]. He was able to ambulate overseas without any exertional chest discomfort or shortness of breath. He does not have any heart failure signs on physical exam. He has no lower extremity edema, orthopnea or paroxysmal nocturnal dyspnea. He seems to be tolerating Entresto quite well, lab work in mid December shows stable renal function and electrolyte. Seen by Dr. Curt Bears 08/21/2017 follow-up echo ordered for this month.-Plan is to see him back after echo to discuss ICD (visit scheduled for March 26)  Recent Hospitalizations: None  Studies Personally Reviewed - (if available, images/films reviewed: From Epic Chart or Care Everywhere)  2 DEcho 12/07/2017 Anterior , septal, apical and inferior apical hypokinesis. The cavity size was moderately dilated. Wall  thickness was normal. Systolic function was severely reduced. Mild AoV calcification  Interval History: Mr. Blake [name] returns today doing well.  He really has no complaints he does amazingly well for someone with a EF of 25% ang.  He does his exercise routinely every day without any problems beside may be noticing some short of breath if he climbs up a hill at a fast pace or goes up several flights of steps.  But exercising routinely and walking routinely he has a problems at all chest tightness, pressure or dyspnea with rest or exertion.  He denies any lightheadedness or dizziness or wooziness with his blood pressure.  He denies any rapid irregular heartbeats or palpitations.  No syncope/near syncope or  TIA/amaurosis fugax.  Although his EF did not improve on echo, his overall demeanor and sense of health has improved since we had him started on the carvedilol and then Entresto.  Obviously with his blood pressure being roughly 749 systolic, we have not been able to titrate his medications, but he has remained relatively free of any heart failure symptoms of PND, orthopnea or edema.  Has not required diuretic. He initially had some shortness of breath issues with Brilinta, but no longer.  No bleeding issues either -no melena, hematochezia, hematuria, epistaxis or easy bruising/bleeding.  No claudication.  He says his PCP is about ready to check labs probably within the next month or so.  He enjoyed his trip back to [place] to visit family.  Had no issues with traveling.  No swelling or syncope/near syncope.  He tolerated the flight/altitude without dyspnea.  ROS: A comprehensive was performed. Review of Systems  Constitutional: Positive for weight loss (He has been adjusting his diet and exercising, but not actively trying to lose weight). Negative for chills, fever and malaise/fatigue.  HENT: Negative for congestion and nosebleeds.   Respiratory: Negative for cough, shortness of breath and wheezing.   Cardiovascular: Negative for chest pain, palpitations and leg swelling.  Gastrointestinal: Negative for abdominal pain and heartburn.  Genitourinary: Negative for dysuria and frequency.  Musculoskeletal: Negative for joint pain and myalgias.  Neurological: Negative for dizziness and weakness. Focal weakness:    Psychiatric/Behavioral: Negative for depression and memory loss. The patient is not nervous/anxious and does  not have insomnia.   All other systems reviewed and are negative.  I have reviewed and (if needed) personally updated the patient's problem list, medications, allergies, past medical and surgical history, social and family history.   Past Medical History:  Diagnosis Date  . CAD  (coronary artery disease), native coronary artery 01/2017   a. 01/2017: anterior STEMI with 100% occlusion of the prox-mid LAD with 95% distal LAD stenosis due to distal embolization of thrombus. A Synergy DES was placed to the prox-LAD.  Marland Kitchen Cancer [facility]) 2009   brain tumor  . Hyperlipemia   . Ischemic cardiomyopathy    a. 01/2017: initial echo showed a reduced EF of 30-35% with repeat echo of 40-45%. -->  However follow-up echo showed EF of 30-35%, EF confirmed to be 30% by MRI October 2018.  Marland Kitchen LUMBAR RADICULOPATHY, RIGHT 07/02/2008   Qualifier: Diagnosis of  By: Alain Marion MD, Evie Lacks Memory loss 07/05/2008   Qualifier: Diagnosis of  By: Alain Marion MD, [place] 07/05/2008   Qualifier: Diagnosis of  By: Alain Marion MD, Evie Lacks Neoplasm of unspecified nature of brain 08/18/2008   Qualifier: Diagnosis of  By: Alain Marion MD, Evie Lacks Rash and other nonspecific skin eruption 08/18/2008   Qualifier: Diagnosis of  By: Doralee Albino    . ST elevation myocardial infarction (STEMI) of anterior wall ([place]) 01/2017   Large anterior infarct.  . TOBACCO USER 11/28/2009   Qualifier: Diagnosis of  By: Alain Marion MD, Evie Lacks   . VITAMIN D DEFICIENCY 07/02/2008   Qualifier: Diagnosis of  By: Plotnikov MD, Evie Lacks     Past Surgical History:  Procedure Laterality Date  . CORONARY STENT INTERVENTION N/A 02/23/2017   Procedure: Coronary Stent Intervention;  Surgeon: Leonie Man, MD;  Location: [place] [facility];  Service: Cardiovascular;  Laterality: N/A;  . CORONARY/GRAFT ACUTE MI REVASCULARIZATION N/A 02/23/2017   Procedure: Coronary/Graft Acute MI Revascularization;  Surgeon: Leonie Man, MD;  Location: [place] CV LAB;  Service: Cardiovascular;  Laterality: N/A;  . CRANIECTOMY / [place] TUMOR  2009   surgery and XRT at [facility]  . LEFT HEART CATH AND CORONARY ANGIOGRAPHY N/A 02/23/2017   Procedure: Left Heart Cath and Coronary  Angiography;  Surgeon: Leonie Man, MD;  Location: [place] CV LAB;  Service: Cardiovascular;  Laterality: N/A;   Post-Intervention Diagram 01/2017     Current Meds  Medication Sig  . aspirin 81 MG chewable tablet Chew 1 tablet (81 mg total) by mouth daily.  Marland Kitchen atorvastatin (LIPITOR) 40 MG tablet Take 1 tablet (40 mg total) by mouth daily.  . carvedilol (COREG) 3.125 MG tablet Take 1 tablet (3.125 mg total) by mouth 2 (two) times daily with a meal.  . Cholecalciferol (VITAMIN D3) 2000 units capsule Take 1 capsule (2,000 Units total) by mouth daily.  Marland Kitchen ENTRESTO 49-51 MG TAKE 1 TABLET BY MOUTH TWICE A DAY  . meloxicam (MOBIC) 7.5 MG tablet Take 7.5 mg by mouth daily as needed. AS NEEDED FOR MODERATE PAIN  . nitroGLYCERIN (NITROSTAT) 0.4 MG SL tablet Place 1 tablet (0.4 mg total) under the tongue every 5 (five) minutes as needed.  . [DISCONTINUED] ticagrelor (BRILINTA) 90 MG TABS tablet Take 1 tablet (90 mg total) by mouth 2 (two) times daily.    No Known Allergies  Social History   Tobacco Use  . Smoking status: Former Smoker  Packs/day: 0.07    Types: Cigarettes    Last attempt to quit: 03/31/2017    Years since quitting: 0.7  . Smokeless tobacco: Never Used  Substance Use Topics  . Alcohol use: No  . Drug use: No   Social History   Social History Narrative   Very active. 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 He also walks daily after work for at least 30-45 minutes. On weekends he tries to walk at least an hour. This is usually a fast walk or slow jog.      He quit smoking shortly after his MI.    family history includes Alzheimer's disease in his father; Arthritis in his mother; Goiter in his mother; Hypertension in his other; Parkinsonism in his father.  Wt Readings from Last 3 Encounters:  12/10/17 168 lb 6.4 oz (76.4 kg)  10/09/17 172 lb 3.2 oz (78.1 kg)  09/05/17 173 lb (78.5 kg)    PHYSICAL EXAM BP 102/66   Pulse 67   Ht 5\' 7"  (1.702 m)   Wt 168 lb  6.4 oz (76.4 kg)   BMI 26.38 kg/m  Physical Exam  Constitutional: He is oriented to person, place, and time. He appears well-developed and well-nourished. No distress.  Very healthy-appearing.  Well-groomed.  HENT:  Head: Normocephalic and atraumatic.  Mouth/Throat: No oropharyngeal exudate.  Eyes: EOM are normal.  Neck: No hepatojugular reflux and no JVD present. Carotid bruit is not present. No tracheal deviation present. No thyromegaly present.  Cardiovascular: Normal rate, regular rhythm and intact distal pulses.  No extrasystoles are present. PMI is not displaced. Exam reveals no gallop and no friction rub.  Murmur (Cannot exclude soft 1/6 SEM at RUSB.) heard. Pulmonary/Chest: Effort normal and breath sounds normal. No respiratory distress. He has no wheezes. He has no rales.  Abdominal: Soft. Bowel sounds are normal. He exhibits no distension. There is no tenderness. There is no rebound and no guarding.  Musculoskeletal: Normal range of motion. He exhibits no edema.  Neurological: He is alert and oriented to person, place, and time.  Skin: Skin is warm and dry.  Psychiatric: He has a normal mood and affect. His behavior is normal. Judgment and thought content normal.  He has a somewhat intense albeit very pleasant affect.  He is very Chief Financial Officer like  Nursing note and vitals reviewed.    Adult ECG Report Not checked  Other studies Reviewed: Additional studies/ records that were reviewed today include:  Recent Labs:  Lab Results  Component Value Date   CHOL 122 09/11/2017   HDL 44 09/11/2017   LDLCALC 61 09/11/2017   LDLDIRECT 161.0 05/14/2012   TRIG 83 09/11/2017   CHOLHDL 2.8 09/11/2017    ASSESSMENT / PLAN: Problem List Items Addressed This Visit    Ischemic cardiomyopathy - Primary (Chronic)    Large anterior infarct from LAD occlusion with delayed presentation.  At this point I do not think that that area is getting any better than the cardiac MRI as well as echo  continued to show anterior akinesis.  Despite having reduced ejection fraction, he is continuing on without any symptoms wonderfully.  He does not have enough blood pressure room to further titrate his carvedilol or Entresto, but seems to be tolerating the current dose. He remains euvolemic without having a diuretic requirement.  Hopefully with Delene Loll, he will not need any.  No signs of any dangerous arrhythmias, however with a large anterior MI and reduced EF he is clearly at  risk for VT and I have referred him to Dr. Curt Bears to consider ICD.  Due to discussed this in follow-up later this month.      Hyperlipidemia LDL goal <70 (Chronic)    Labs from December look great.  LDL at 61.  Target would really be 50 so we will continue with current dose of atorvastatin.  Apparently his PCP should be checking labs this month for his annual follow-up visit.  We will look forward to seeing those results.      Former cigarette smoker - Quit July 2018 (Chronic)    Pretty much quit cold [place] when he had his MI.  I congratulated his efforts.  He is not looking back.      Coronary artery disease involving native coronary artery of native heart with unstable angina pectoris (HCC) (Chronic)    S/p large anterior STEMI with occluded LAD successfully opened up with a DES stent.  My hope was that since it was occluded just after a diagonal branch, that some of the anterior covered in the EF would be better.  Unfortunately despite optimizing medical management, his EF is not improved.  He remains on aspirin and Brilinta along with beta-blocker and statin. I think it 1 year we can stop aspirin and switch to 60 mg Brilinta.  I will actually see him back in 6 months, so we will have him change out at the completion of his current prescription which would be another 3 months.      Benign meningioma of brain [facility])    He asked about questions of reference MRI visit which is done routinely for follow-up and concerns  with having an ICD.  I told him that to my understanding there are MRI safe ICDs.  If Dr. Curt Bears is to choose one that is probably the best option.         Current medicines are reviewed at length with the patient today. (+/- concerns) none The following changes have been made: Reduce Brilinta to 60 mg twice daily at the completion of current bottle (3 months. Patient Instructions  MEDICATION INSTRUCTION  COMPLETE THE CURRENT BRILINTA 90 MG  ( June 2019) [place] 60 MG TWICE A DAY  NO OTHER CHANGES     Your physician wants you to follow-up in 6 months with Dr Ellyn Hack. You will receive a reminder letter in the mail two months in advance. If you don't receive a letter, please call our office to schedule the follow-up appointment.   If you need a refill on your cardiac medications before your next appointment, please call your pharmacy.    Studies Ordered:   No orders of the defined types were placed in this encounter.     Glenetta Hew, M.D., M.S. Interventional Cardiologist   Pager # [phone] Phone # [phone] [address] [place], [place] [address]   Thank you for choosing [facility] at [facility]!!

## 2017-12-11 ENCOUNTER — Ambulatory Visit (HOSPITAL_COMMUNITY): Payer: 59

## 2017-12-12 ENCOUNTER — Encounter: Payer: Self-pay | Admitting: Cardiology

## 2017-12-12 NOTE — Assessment & Plan Note (Signed)
S/p large anterior STEMI with occluded LAD successfully opened up with a DES stent.  My hope was that since it was occluded just after a diagonal branch, that some of the anterior covered in the EF would be better.  Unfortunately despite optimizing medical management, his EF is not improved.  He remains on aspirin and Brilinta along with beta-blocker and statin. I think it 1 year we can stop aspirin and switch to 60 mg Brilinta.  I will actually see him back in 6 months, so we will have him change out at the completion of his current prescription which would be another 3 months.

## 2017-12-12 NOTE — Assessment & Plan Note (Signed)
Large anterior infarct from LAD occlusion with delayed presentation.  At this point I do not think that that area is getting any better than the cardiac MRI as well as echo continued to show anterior akinesis.  Despite having reduced ejection fraction, he is continuing on without any symptoms wonderfully.  He does not have enough blood pressure room to further titrate his carvedilol or Entresto, but seems to be tolerating the current dose. He remains euvolemic without having a diuretic requirement.  Hopefully with Delene Loll, he will not need any.  No signs of any dangerous arrhythmias, however with a large anterior MI and reduced EF he is clearly at risk for VT and I have referred him to Dr. Curt Bears to consider ICD.  Due to discussed this in follow-up later this month.

## 2017-12-12 NOTE — Assessment & Plan Note (Signed)
Pretty much quit cold [place] when he had his MI.  I congratulated his efforts.  He is not looking back.

## 2017-12-12 NOTE — Assessment & Plan Note (Signed)
Labs from December look great.  LDL at 61.  Target would really be 50 so we will continue with current dose of atorvastatin.  Apparently his PCP should be checking labs this month for his annual follow-up visit.  We will look forward to seeing those results.

## 2017-12-12 NOTE — Assessment & Plan Note (Addendum)
He asked about questions of reference MRI visit which is done routinely for follow-up and concerns with having an ICD.  I told him that to my understanding there are MRI safe ICDs.  If Dr. Curt Bears is to choose one that is probably the best option.

## 2017-12-13 ENCOUNTER — Ambulatory Visit (HOSPITAL_COMMUNITY): Payer: 59

## 2017-12-13 ENCOUNTER — Encounter: Payer: Self-pay | Admitting: *Deleted

## 2017-12-16 ENCOUNTER — Ambulatory Visit (HOSPITAL_COMMUNITY): Payer: 59

## 2017-12-18 ENCOUNTER — Ambulatory Visit (HOSPITAL_COMMUNITY): Payer: 59

## 2017-12-20 ENCOUNTER — Other Ambulatory Visit: Payer: Self-pay | Admitting: *Deleted

## 2017-12-20 ENCOUNTER — Ambulatory Visit (HOSPITAL_COMMUNITY): Payer: 59

## 2017-12-20 MED ORDER — SACUBITRIL-VALSARTAN 49-51 MG PO TABS: 1.0000 | ORAL_TABLET | Freq: Two times a day (BID) | ORAL | 0 refills | Status: DC

## 2017-12-20 NOTE — Telephone Encounter (Signed)
Patient left a msg on the refill vm requesting a ninety day rx.

## 2017-12-20 NOTE — Telephone Encounter (Signed)
Rx has been sent to the pharmacy electronically. ° °

## 2017-12-23 ENCOUNTER — Ambulatory Visit (HOSPITAL_COMMUNITY): Payer: 59

## 2017-12-24 ENCOUNTER — Encounter: Payer: Self-pay | Admitting: Cardiology

## 2017-12-24 ENCOUNTER — Ambulatory Visit: Payer: 59 | Admitting: Cardiology

## 2017-12-24 VITALS — BP 110/80 | HR 60 | Ht 67.0 in | Wt 165.0 lb

## 2017-12-24 DIAGNOSIS — I2510 Atherosclerotic heart disease of native coronary artery without angina pectoris: Secondary | ICD-10-CM | POA: Diagnosis not present

## 2017-12-24 DIAGNOSIS — E785 Hyperlipidemia, unspecified: Secondary | ICD-10-CM

## 2017-12-24 DIAGNOSIS — I255 Ischemic cardiomyopathy: Secondary | ICD-10-CM | POA: Diagnosis not present

## 2017-12-24 NOTE — Progress Notes (Signed)
Electrophysiology Office Note   Date:  12/24/2017   ID:  Blake [name], DOB 09-07-1956, MRN [id number]  PCP:  Cassandria Anger, MD  Cardiologist:  Ellyn Hack Primary Electrophysiologist:  Tyiana Hill Meredith Leeds, MD    Chief Complaint  Patient presents with  . Follow-up    Ischemic cardiomyopathy/Chronic systolic HF     History of Present Illness: Blake [name] is a 62 y.o. male who is being seen today for the evaluation of CHF at the request of Leonie Man, MD. Presenting today for electrophysiology evaluation.  He has a history of coronary disease status post anterior STEMI with LAD PCI and ischemic cardiomyopathy.  Most recent echo showed an ejection fraction of 25-30%.  He had a cardiac MRI that showed extensive anterior scar transmural, as well as an ejection fraction of 32%.  He has been working with cardiac rehab since his MI.    Today, denies symptoms of palpitations, chest pain, shortness of breath, orthopnea, PND, lower extremity edema, claudication, dizziness, presyncope, syncope, bleeding, or neurologic sequela. The patient is tolerating medications without difficulties.  Has been exercising without major issue.  He is not having any limitations during exercise.  No shortness of breath or chest pain.   Past Medical History:  Diagnosis Date  . CAD (coronary artery disease), native coronary artery 01/2017   a. 01/2017: anterior STEMI with 100% occlusion of the prox-mid LAD with 95% distal LAD stenosis due to distal embolization of thrombus. A Synergy DES was placed to the prox-LAD.  Marland Kitchen Cancer [facility]) 2009   brain tumor  . Hyperlipemia   . Ischemic cardiomyopathy    a. 01/2017: initial echo showed a reduced EF of 30-35% with repeat echo of 40-45%. -->  However follow-up echo showed EF of 30-35%, EF confirmed to be 30% by MRI October 2018.  Marland Kitchen LUMBAR RADICULOPATHY, RIGHT 07/02/2008   Qualifier: Diagnosis of  By: Alain Marion MD, Evie Lacks Memory loss 07/05/2008   Qualifier:  Diagnosis of  By: Alain Marion MD, [place] 07/05/2008   Qualifier: Diagnosis of  By: Alain Marion MD, Evie Lacks Neoplasm of unspecified nature of brain 08/18/2008   Qualifier: Diagnosis of  By: Alain Marion MD, Evie Lacks Rash and other nonspecific skin eruption 08/18/2008   Qualifier: Diagnosis of  By: Doralee Albino    . ST elevation myocardial infarction (STEMI) of anterior wall ([place]) 01/2017   Large anterior infarct.  . TOBACCO USER 11/28/2009   Qualifier: Diagnosis of  By: Alain Marion MD, Evie Lacks   . VITAMIN D DEFICIENCY 07/02/2008   Qualifier: Diagnosis of  By: Plotnikov MD, Evie Lacks    Past Surgical History:  Procedure Laterality Date  . CORONARY STENT INTERVENTION N/A 02/23/2017   Procedure: Coronary Stent Intervention;  Surgeon: Leonie Man, MD;  Location: [place] CV LAB;  Service: Cardiovascular;  Laterality: N/A;  . CORONARY/GRAFT ACUTE MI REVASCULARIZATION N/A 02/23/2017   Procedure: Coronary/Graft Acute MI Revascularization;  Surgeon: Leonie Man, MD;  Location: [place] CV LAB;  Service: Cardiovascular;  Laterality: N/A;  . CRANIECTOMY / [place] TUMOR  2009   surgery and XRT at [facility]  . LEFT HEART CATH AND CORONARY ANGIOGRAPHY N/A 02/23/2017   Procedure: Left Heart Cath and Coronary Angiography;  Surgeon: Leonie Man, MD;  Location: [place] [facility];  Service: Cardiovascular;  Laterality: N/A;     Current Outpatient Medications  Medication Sig Dispense Refill  .  aspirin 81 MG chewable tablet Chew 1 tablet (81 mg total) by mouth daily.    Marland Kitchen atorvastatin (LIPITOR) 40 MG tablet Take 1 tablet (40 mg total) by mouth daily. 90 tablet 3  . carvedilol (COREG) 3.125 MG tablet Take 1 tablet (3.125 mg total) by mouth 2 (two) times daily with a meal. 180 tablet 3  . Cholecalciferol (VITAMIN D3) 2000 units capsule Take 1 capsule (2,000 Units total) by mouth daily. 100 capsule 3  . nitroGLYCERIN (NITROSTAT) 0.4 MG SL  tablet Place 1 tablet (0.4 mg total) under the tongue every 5 (five) minutes as needed. 25 tablet 3  . sacubitril-valsartan (ENTRESTO) 49-51 MG Take 1 tablet by mouth 2 (two) times daily. 90 tablet 0  . ticagrelor (BRILINTA) 60 MG TABS tablet Take 1 tablet (60 mg total) by mouth 2 (two) times daily. 180 tablet 3   No current facility-administered medications for this visit.     Allergies:   Patient has no known allergies.   Social History:  The patient  reports that he quit smoking about 8 months ago. His smoking use included cigarettes. He smoked 0.07 packs per day. He has never used smokeless tobacco. He reports that he does not drink alcohol or use drugs.   Family History:  The patient's family history includes Alzheimer's disease in his father; Arthritis in his mother; Goiter in his mother; Hypertension in his other; Parkinsonism in his father.   ROS:  Please see the history of present illness.   Otherwise, review of systems is positive for none.   All other systems are reviewed and negative.   PHYSICAL EXAM: VS:  BP 110/80   Pulse 60   Ht 5\' 7"  (1.702 m)   Wt 165 lb (74.8 kg)   BMI 25.84 kg/m  , BMI Body mass index is 25.84 kg/m. GEN: Well nourished, well developed, in no acute distress  HEENT: normal  Neck: no JVD, carotid bruits, or masses Cardiac: RRR; no murmurs, rubs, or gallops,no edema  Respiratory:  clear to auscultation bilaterally, normal work of breathing GI: soft, nontender, nondistended, + BS MS: no deformity or atrophy  Skin: warm and dry Neuro:  Strength and sensation are intact Psych: euthymic mood, full affect  EKG:  EKG is not ordered today. Personal review of the ekg ordered 08/21/17 shows sinus rhythm, anteroseptal infarct, rate 61   Recent Labs: 02/23/2017: Magnesium 1.9; TSH 0.521 02/26/2017: Hemoglobin 13.6; Platelets 211 06/26/2017: ALT 28 09/11/2017: BUN 14; Creatinine, Ser 0.88; Potassium 4.9; Sodium 143    Lipid Panel     Component Value  Date/Time   CHOL 122 09/11/2017 1149   TRIG 83 09/11/2017 1149   HDL 44 09/11/2017 1149   CHOLHDL 2.8 09/11/2017 1149   CHOLHDL 4.0 02/23/2017 1821   VLDL 14 02/23/2017 1821   LDLCALC 61 09/11/2017 1149   LDLDIRECT 161.0 05/14/2012 0739     Wt Readings from Last 3 Encounters:  12/24/17 165 lb (74.8 kg)  12/10/17 168 lb 6.4 oz (76.4 kg)  10/09/17 172 lb 3.2 oz (78.1 kg)      Other studies Reviewed: Additional studies/ records that were reviewed today include: TTE 12/03/17 Review of the above records today demonstrates:  - Left ventricle: Anterior , septal, apical and inferior apical   hypokinesis. The cavity size was moderately dilated. Wall   thickness was normal. Systolic function was severely reduced. The   estimated ejection fraction was in the range of 25% to 30%. Left   ventricular diastolic function  parameters were normal. - Mitral valve: There was mild regurgitation. - Left atrium: The atrium was mildly dilated. - Atrial septum: No defect or patent foramen ovale was identified.  CMRI 07/03/17 1) Moderate LVE with evidence for large previous anterior MI EF 32%  2) Delayed gadolinium images show full thickness scar involving the mid and distal anterior wall, septum apex and inferior apex  3) Mild LAE  4) Normal RV  The patient would appear to have a large substrate for re entrant VT and be a candidate for AICD  ASSESSMENT AND PLAN:  1.  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  He had a repeat echo that showed a persistently low ejection fraction.  At this point, he does feel that an ICD would be beneficial, though he is concerned about the timing.  He is not having much in the way of cardiac symptoms.  He does not have much help at home at this time, his son is at school and his wife is out of the country.  He would like to wait until the summer to further consider ICD implant.     2.  Coronary artery disease status post anterior STEMI: Status post drug-eluting stent.  Has a large transmural scar on cardiac MRI.  No current chest pain.  Continue dual antiplatelet therapy.  3.  Hyperlipidemia: LDL goal less than 70.  Continue atorvastatin per primary cardiology.    Current medicines are reviewed at length with the patient today.   The patient does not have concerns regarding his medicines.  The following changes were made today: None  Labs/ tests ordered today include:  No orders of the defined types were placed in this encounter.  Case discussed with primary cardiology  Disposition:   FU with Felipe Paluch 5 months  Signed, Avri Paiva Meredith Leeds, MD  12/24/2017 9:00 AM     [facility] [address] [place] [address] [phone] (office) [phone] (fax)

## 2017-12-24 NOTE — Patient Instructions (Signed)
Medication Instructions: Your physician recommends that you continue on your current medications as directed. Please refer to the Current Medication list given to you today.   Labwork: None ordered  Procedures/Testing: None ordered  Follow-Up: Your physician wants you to follow-up in: 5 months with Dr. Curt Bears.  You will receive a reminder letter in the mail two months in advance. If you don't receive a letter, please call our office to schedule the follow-up appointment.   Any Additional Special Instructions Will Be Listed Below (If Applicable).     If you need a refill on your cardiac medications before your next appointment, please call your pharmacy.

## 2017-12-25 ENCOUNTER — Ambulatory Visit (HOSPITAL_COMMUNITY): Payer: 59

## 2017-12-27 ENCOUNTER — Ambulatory Visit (HOSPITAL_COMMUNITY): Payer: 59

## 2017-12-30 ENCOUNTER — Ambulatory Visit (HOSPITAL_COMMUNITY): Payer: 59

## 2018-01-01 ENCOUNTER — Ambulatory Visit (HOSPITAL_COMMUNITY): Payer: 59

## 2018-01-03 ENCOUNTER — Ambulatory Visit (HOSPITAL_COMMUNITY): Payer: 59

## 2018-01-06 ENCOUNTER — Ambulatory Visit (HOSPITAL_COMMUNITY): Payer: 59

## 2018-01-08 ENCOUNTER — Ambulatory Visit (HOSPITAL_COMMUNITY): Payer: 59

## 2018-01-10 ENCOUNTER — Ambulatory Visit (HOSPITAL_COMMUNITY): Payer: 59

## 2018-01-13 ENCOUNTER — Ambulatory Visit (HOSPITAL_COMMUNITY): Payer: 59

## 2018-01-15 ENCOUNTER — Ambulatory Visit (HOSPITAL_COMMUNITY): Payer: 59

## 2018-01-17 ENCOUNTER — Ambulatory Visit (HOSPITAL_COMMUNITY): Payer: 59

## 2018-01-20 ENCOUNTER — Ambulatory Visit (HOSPITAL_COMMUNITY): Payer: 59

## 2018-01-22 ENCOUNTER — Telehealth: Payer: Self-pay | Admitting: *Deleted

## 2018-01-22 ENCOUNTER — Ambulatory Visit (HOSPITAL_COMMUNITY): Payer: 59

## 2018-01-22 ENCOUNTER — Other Ambulatory Visit: Payer: Self-pay | Admitting: Cardiology

## 2018-01-22 NOTE — Telephone Encounter (Signed)
NOTIFIED PHARMACY PA [place]

## 2018-01-22 NOTE — Telephone Encounter (Signed)
REFILL 

## 2018-01-22 NOTE — Telephone Encounter (Signed)
STARTED PRIOR AUTHORIZATION -ENTRESTO 49/51 MG  180 MG X3 REFILLS  KEY: [id number]

## 2018-01-24 ENCOUNTER — Ambulatory Visit (HOSPITAL_COMMUNITY): Payer: 59

## 2018-01-24 ENCOUNTER — Ambulatory Visit (INDEPENDENT_AMBULATORY_CARE_PROVIDER_SITE_OTHER): Payer: 59 | Admitting: Internal Medicine

## 2018-01-24 ENCOUNTER — Encounter: Payer: Self-pay | Admitting: Internal Medicine

## 2018-01-24 ENCOUNTER — Other Ambulatory Visit (INDEPENDENT_AMBULATORY_CARE_PROVIDER_SITE_OTHER): Payer: 59

## 2018-01-24 VITALS — BP 112/68 | HR 56 | Temp 98.5°F | Ht 67.0 in | Wt 162.0 lb

## 2018-01-24 DIAGNOSIS — I25110 Atherosclerotic heart disease of native coronary artery with unstable angina pectoris: Secondary | ICD-10-CM | POA: Diagnosis not present

## 2018-01-24 DIAGNOSIS — R0989 Other specified symptoms and signs involving the circulatory and respiratory systems: Secondary | ICD-10-CM

## 2018-01-24 DIAGNOSIS — D320 Benign neoplasm of cerebral meninges: Secondary | ICD-10-CM

## 2018-01-24 DIAGNOSIS — Z0000 Encounter for general adult medical examination without abnormal findings: Secondary | ICD-10-CM

## 2018-01-24 DIAGNOSIS — E559 Vitamin D deficiency, unspecified: Secondary | ICD-10-CM

## 2018-01-24 DIAGNOSIS — E785 Hyperlipidemia, unspecified: Secondary | ICD-10-CM

## 2018-01-24 DIAGNOSIS — I255 Ischemic cardiomyopathy: Secondary | ICD-10-CM

## 2018-01-24 LAB — URINALYSIS
BILIRUBIN URINE: NEGATIVE
Ketones, ur: NEGATIVE
Leukocytes, UA: NEGATIVE
NITRITE: NEGATIVE
PH: 5.5 (ref 5.0–8.0)
Specific Gravity, Urine: 1.02 (ref 1.000–1.030)
TOTAL PROTEIN, URINE-UPE24: NEGATIVE
URINE GLUCOSE: NEGATIVE
Urobilinogen, UA: 0.2 (ref 0.0–1.0)

## 2018-01-24 LAB — BASIC METABOLIC PANEL
BUN: 18 mg/dL (ref 6–23)
CHLORIDE: 107 meq/L (ref 96–112)
CO2: 26 mEq/L (ref 19–32)
Calcium: 9.4 mg/dL (ref 8.4–10.5)
Creatinine, Ser: 0.9 mg/dL (ref 0.40–1.50)
GFR: 91.65 mL/min (ref >60.00)
Glucose, Bld: 115 mg/dL — ABNORMAL HIGH (ref 70–99)
POTASSIUM: 5.1 meq/L (ref 3.5–5.1)
Sodium: 142 mEq/L (ref 135–145)

## 2018-01-24 LAB — LIPID PANEL
CHOLESTEROL: 116 mg/dL (ref 0–200)
HDL: 43.1 mg/dL (ref >39.00)
LDL CALC: 58 mg/dL (ref 0–99)
NonHDL: 72.75
TRIGLYCERIDES: 74 mg/dL (ref 0.0–149.0)
Total CHOL/HDL Ratio: 3
VLDL: 14.8 mg/dL (ref 0.0–40.0)

## 2018-01-24 LAB — HEPATIC FUNCTION PANEL
ALT: 17 U/L (ref 0–53)
AST: 24 U/L (ref 0–37)
Albumin: 4.3 g/dL (ref 3.5–5.2)
Alkaline Phosphatase: 87 U/L (ref 39–117)
BILIRUBIN DIRECT: 0.2 mg/dL (ref 0.0–0.3)
TOTAL PROTEIN: 6.6 g/dL (ref 6.0–8.3)
Total Bilirubin: 0.7 mg/dL (ref 0.2–1.2)

## 2018-01-24 LAB — CBC WITH DIFFERENTIAL/PLATELET
BASOS PCT: 0.4 % (ref 0.0–3.0)
Basophils Absolute: 0 10*3/uL (ref 0.0–0.1)
EOS PCT: 2.4 % (ref 0.0–5.0)
Eosinophils Absolute: 0.2 10*3/uL (ref 0.0–0.7)
HCT: 39.9 % (ref 39.0–52.0)
Hemoglobin: 13.2 g/dL (ref 13.0–17.0)
LYMPHS ABS: 1.5 10*3/uL (ref 0.7–4.0)
Lymphocytes Relative: 24 % (ref 12.0–46.0)
MCHC: 33.1 g/dL (ref 30.0–36.0)
MCV: 89.5 fl (ref 78.0–100.0)
MONO ABS: 0.4 10*3/uL (ref 0.1–1.0)
MONOS PCT: 5.6 % (ref 3.0–12.0)
NEUTROS PCT: 67.6 % (ref 43.0–77.0)
Neutro Abs: 4.2 10*3/uL (ref 1.4–7.7)
Platelets: 239 10*3/uL (ref 150.0–400.0)
RBC: 4.46 Mil/uL (ref 4.22–5.81)
RDW: 13.8 % (ref 11.5–15.5)
WBC: 6.2 10*3/uL (ref 4.0–10.5)

## 2018-01-24 LAB — VITAMIN D 25 HYDROXY (VIT D DEFICIENCY, FRACTURES): VITD: 28.54 ng/mL — ABNORMAL LOW (ref 30.00–100.00)

## 2018-01-24 LAB — TSH: TSH: 0.4 u[IU]/mL (ref 0.35–4.50)

## 2018-01-24 LAB — PSA: PSA: 0.87 ng/mL (ref 0.10–4.00)

## 2018-01-24 NOTE — Assessment & Plan Note (Signed)
Lipitor, Coreg 

## 2018-01-24 NOTE — Assessment & Plan Note (Signed)
We discussed age appropriate health related issues, including available/recomended screening tests and vaccinations. We discussed a need for adhering to healthy diet and exercise. Labs/EKG were reviewed/ordered. All questions were answered. Declined colonoscopy Opth q 24 mo Shingrix advised Cologuard (colonoscopy declined) 2017

## 2018-01-24 NOTE — Assessment & Plan Note (Signed)
Vit D 

## 2018-01-24 NOTE — Assessment & Plan Note (Signed)
Lipitor 

## 2018-01-24 NOTE — Assessment & Plan Note (Signed)
Entresto

## 2018-01-24 NOTE — Patient Instructions (Signed)

## 2018-01-24 NOTE — Progress Notes (Signed)
Subjective:  Patient ID: Blake [name], male    DOB: 1956-06-25  Age: 62 y.o. MRN: [id number]  CC: No chief complaint on file.   HPI [name] presents for a well exam. Pt declined shots. F/u CAD, dyslipidemia, meeningioma  Outpatient Medications Prior to Visit  Medication Sig Dispense Refill  . aspirin 81 MG chewable tablet Chew 1 tablet (81 mg total) by mouth daily.    . carvedilol (COREG) 3.125 MG tablet Take 1 tablet (3.125 mg total) by mouth 2 (two) times daily with a meal. 180 tablet 3  . Cholecalciferol (VITAMIN D3) 2000 units capsule Take 1 capsule (2,000 Units total) by mouth daily. 100 capsule 3  . ENTRESTO 49-51 MG TAKE 1 TABLET BY MOUTH TWICE A DAY 90 tablet 1  . nitroGLYCERIN (NITROSTAT) 0.4 MG SL tablet Place 1 tablet (0.4 mg total) under the tongue every 5 (five) minutes as needed. 25 tablet 3  . ticagrelor (BRILINTA) 60 MG TABS tablet Take 1 tablet (60 mg total) by mouth 2 (two) times daily. 180 tablet 3  . atorvastatin (LIPITOR) 40 MG tablet Take 1 tablet (40 mg total) by mouth daily. 90 tablet 3   No facility-administered medications prior to visit.     ROS Review of Systems  Constitutional: Negative for appetite change, fatigue and unexpected weight change.  HENT: Negative for congestion, nosebleeds, sneezing, sore throat and trouble swallowing.   Eyes: Negative for itching and visual disturbance.  Respiratory: Negative for cough.   Cardiovascular: Negative for chest pain, palpitations and leg swelling.  Gastrointestinal: Negative for abdominal distention, blood in stool, diarrhea and nausea.  Genitourinary: Negative for frequency and hematuria.  Musculoskeletal: Negative for back pain, gait problem, joint swelling and neck pain.  Skin: Negative for rash.  Neurological: Negative for dizziness, tremors, speech difficulty and weakness.  Psychiatric/Behavioral: Negative for agitation, dysphoric mood, sleep disturbance and suicidal ideas. The patient is not  nervous/anxious.     Objective:  BP 112/68 (BP Location: Left Arm, Patient Position: Sitting, Cuff Size: Normal)   Pulse (!) 56   Temp 98.5 F (36.9 C) (Oral)   Ht 5\' 7"  (1.702 m)   Wt 162 lb (73.5 kg)   SpO2 98%   BMI 25.37 kg/m   BP Readings from Last 3 Encounters:  01/24/18 112/68  12/24/17 110/80  12/10/17 102/66    Wt Readings from Last 3 Encounters:  01/24/18 162 lb (73.5 kg)  12/24/17 165 lb (74.8 kg)  12/10/17 168 lb 6.4 oz (76.4 kg)    Physical Exam  Constitutional: He is oriented to person, place, and time. He appears well-developed and well-nourished. No distress.  HENT:  Head: Normocephalic and atraumatic.  Right Ear: External ear normal.  Left Ear: External ear normal.  Nose: Nose normal.  Mouth/Throat: Oropharynx is clear and moist. No oropharyngeal exudate.  Eyes: Pupils are equal, round, and reactive to light. Conjunctivae and EOM are normal. Right eye exhibits no discharge. Left eye exhibits no discharge. No scleral icterus.  Neck: Normal range of motion. Neck supple. No JVD present. No tracheal deviation present. No thyromegaly present.  Cardiovascular: Normal rate, regular rhythm, normal heart sounds and intact distal pulses. Exam reveals no gallop and no friction rub.  No murmur heard. Pulmonary/Chest: Effort normal and breath sounds normal. No stridor. No respiratory distress. He has no wheezes. He has no rales. He exhibits no tenderness.  Abdominal: Soft. Bowel sounds are normal. He exhibits no distension and no mass. There is no tenderness. There is  no rebound and no guarding.  Genitourinary: Rectum normal and penis normal. Rectal exam shows guaiac negative stool. No penile tenderness.  Musculoskeletal: Normal range of motion. He exhibits no edema or tenderness.  Lymphadenopathy:    He has no cervical adenopathy.  Neurological: He is alert and oriented to person, place, and time. He has normal reflexes. He displays normal reflexes. No cranial nerve  deficit. He exhibits normal muscle tone. Coordination normal.  Skin: Skin is warm and dry. No rash noted. He is not diaphoretic. No erythema. No pallor.  Psychiatric: He has a normal mood and affect. His behavior is normal. Judgment and thought content normal.   R bruit Prostate 1+  Lab Results  Component Value Date   WBC 9.3 02/26/2017   HGB 13.6 02/26/2017   HCT 40.6 02/26/2017   PLT 211 02/26/2017   GLUCOSE 114 (H) 09/11/2017   CHOL 122 09/11/2017   TRIG 83 09/11/2017   HDL 44 09/11/2017   LDLDIRECT 161.0 05/14/2012   LDLCALC 61 09/11/2017   ALT 28 06/26/2017   AST 29 06/26/2017   NA 143 09/11/2017   K 4.9 09/11/2017   CL 104 09/11/2017   CREATININE 0.88 09/11/2017   BUN 14 09/11/2017   CO2 25 09/11/2017   TSH 0.521 02/23/2017   PSA 1.08 12/19/2016   INR 0.9 07/07/2008   HGBA1C 5.7 (H) 02/23/2017    Mr Cardiac Morphology W Wo Contrast  Result Date: 07/03/2017 CLINICAL DATA:  Ischemic cardiomyopathy EXAM: CARDIAC MRI TECHNIQUE: The patient was scanned on a 1.5 Tesla GE magnet. A dedicated cardiac coil was used. Functional imaging was done using Fiesta sequences. 2,3, and 4 chamber views were done to assess for RWMA's. Modified Simpson's rule using a short axis stack was used to calculate an ejection fraction on a dedicated work Conservation officer, nature. The patient received 25 cc of Multihance. After 10 minutes inversion recovery sequences were used to assess for infiltration and scar tissue. CONTRAST:  25 cc Multihance FINDINGS: There was mild LAE. The RA/RV were normal in size and function. There was no ASD/VSD or pericardial effusion. The aortic valve was tri leaflet and normal The mitral valve was normal. The aortic root was normal. There was thinning and akinesis of the mid and distal anterior wall septum apex and inferior apex. There was moderate LVE. Delayed enhancement images showed full thickness scar involving the mid and distal anterior wall, septum apex and  inferior apex. The quantitative EF was 32%. (EDV 175 cc ESV 119 cc SV 56 cc) IMPRESSION: 1) Moderate LVE with evidence for large previous anterior MI EF 32% 2) Delayed gadolinium images show full thickness scar involving the mid and distal anterior wall, septum apex and inferior apex 3) Mild LAE 4) Normal RV The patient would appear to have a large substrate for re entrant VT and be a candidate for AICD Jenkins Rouge Electronically Signed   By: Jenkins Rouge M.D.   On: 07/03/2017 10:45    Assessment & Plan:   There are no diagnoses linked to this encounter. I am having Shing-Ru Marines maintain his Vitamin D3, aspirin, nitroGLYCERIN, carvedilol, atorvastatin, ticagrelor, and ENTRESTO.  No orders of the defined types were placed in this encounter.    Follow-up: No follow-ups on file.  Walker Kehr, MD

## 2018-01-24 NOTE — Assessment & Plan Note (Signed)
Carot doppler US

## 2018-01-24 NOTE — Assessment & Plan Note (Addendum)
F/u at [facility] q 24 mo

## 2018-01-27 ENCOUNTER — Ambulatory Visit (HOSPITAL_COMMUNITY): Payer: 59

## 2018-01-27 ENCOUNTER — Other Ambulatory Visit: Payer: Self-pay | Admitting: Internal Medicine

## 2018-01-27 MED ORDER — VITAMIN D3 1.25 MG (50000 UT) PO CAPS: 1.0000 | ORAL_CAPSULE | ORAL | 0 refills | Status: DC

## 2018-01-29 ENCOUNTER — Ambulatory Visit (HOSPITAL_COMMUNITY): Payer: 59

## 2018-01-30 NOTE — Addendum Note (Signed)
Addended by: Aviva Signs M on: 01/30/2018 04:52 PM   Modules accepted: Orders

## 2018-01-31 ENCOUNTER — Ambulatory Visit (HOSPITAL_COMMUNITY): Payer: 59

## 2018-02-03 ENCOUNTER — Ambulatory Visit (HOSPITAL_COMMUNITY): Payer: 59

## 2018-02-05 ENCOUNTER — Ambulatory Visit (HOSPITAL_COMMUNITY)
Admission: RE | Admit: 2018-02-05 | Discharge: 2018-02-05 | Disposition: A | Discharge status: Home / Self Care | Payer: 59 | Source: Ambulatory Visit | Attending: Cardiology | Admitting: Cardiology

## 2018-02-05 ENCOUNTER — Ambulatory Visit (HOSPITAL_COMMUNITY): Payer: 59

## 2018-02-05 DIAGNOSIS — R0989 Other specified symptoms and signs involving the circulatory and respiratory systems: Secondary | ICD-10-CM

## 2018-02-05 DIAGNOSIS — Z87891 Personal history of nicotine dependence: Secondary | ICD-10-CM | POA: Insufficient documentation

## 2018-02-05 DIAGNOSIS — I2510 Atherosclerotic heart disease of native coronary artery without angina pectoris: Secondary | ICD-10-CM | POA: Insufficient documentation

## 2018-02-05 DIAGNOSIS — E785 Hyperlipidemia, unspecified: Secondary | ICD-10-CM | POA: Diagnosis not present

## 2018-02-07 ENCOUNTER — Ambulatory Visit (HOSPITAL_COMMUNITY): Payer: 59

## 2018-02-10 ENCOUNTER — Ambulatory Visit (HOSPITAL_COMMUNITY): Payer: 59

## 2018-02-12 ENCOUNTER — Ambulatory Visit (HOSPITAL_COMMUNITY): Payer: 59

## 2018-02-14 ENCOUNTER — Ambulatory Visit (HOSPITAL_COMMUNITY): Payer: 59

## 2018-02-17 ENCOUNTER — Ambulatory Visit (HOSPITAL_COMMUNITY): Payer: 59

## 2018-02-19 ENCOUNTER — Ambulatory Visit (HOSPITAL_COMMUNITY): Payer: 59

## 2018-02-21 ENCOUNTER — Ambulatory Visit (HOSPITAL_COMMUNITY): Payer: 59

## 2018-02-26 ENCOUNTER — Ambulatory Visit (HOSPITAL_COMMUNITY): Payer: 59

## 2018-02-27 ENCOUNTER — Other Ambulatory Visit: Payer: Self-pay | Admitting: Internal Medicine

## 2018-02-28 ENCOUNTER — Ambulatory Visit (HOSPITAL_COMMUNITY): Payer: 59

## 2018-03-03 ENCOUNTER — Ambulatory Visit (HOSPITAL_COMMUNITY): Payer: 59

## 2018-03-04 ENCOUNTER — Other Ambulatory Visit: Payer: Self-pay | Admitting: Student

## 2018-03-04 NOTE — Telephone Encounter (Signed)
Rx sent to pharmacy   

## 2018-03-05 ENCOUNTER — Ambulatory Visit (HOSPITAL_COMMUNITY): Payer: 59

## 2018-03-06 ENCOUNTER — Telehealth: Payer: Self-pay

## 2018-03-06 NOTE — Telephone Encounter (Signed)
Please advise about form.  Copied from [place] [id number]. Topic: Inquiry >> Mar 03, 2018  3:15 PM Blake [name], Marland Kitchen wrote: Reason for CRM: pt called - he dropped off work form that needs filled out.  He is calling about status of form.  Cb is [phone]   >> Mar 03, 2018  4:37 PM [name] wrote: I do not see where a any form has been dropped off?   Called patient and LVM, I need some more information..   When did the patient drop this form off? What kind of form is it?  >> Mar 04, 2018  8:22 AM [name] wrote:  Relation to pt: self Call back number: [phone]  :  Reason for call:  Patient returned call and stated he gave Dr. Mamie Nick physician result form on 01/24/18 at last office visit, please advise >> Mar 05, 2018 11:44 AM [name] wrote: Inez Catalina do you know anything about this? Can you find out?

## 2018-03-06 NOTE — Telephone Encounter (Signed)
I think I took care of it before I left for vacation.  Thank you

## 2018-03-07 ENCOUNTER — Ambulatory Visit (HOSPITAL_COMMUNITY): Payer: 59

## 2018-03-10 ENCOUNTER — Ambulatory Visit (HOSPITAL_COMMUNITY): Payer: 59

## 2018-03-12 ENCOUNTER — Ambulatory Visit (HOSPITAL_COMMUNITY): Payer: 59

## 2018-03-20 ENCOUNTER — Telehealth: Payer: Self-pay | Admitting: Internal Medicine

## 2018-03-20 NOTE — Telephone Encounter (Signed)
Pt dropped off [employer] Physician Results Forms for completion for both himself & his spouse for completion for their health insurance.  Once completed, please fax to [phone] per pt.  Form placed in Brittany's box.

## 2018-03-24 NOTE — Telephone Encounter (Signed)
Form has been completed & placed in providers box to sign off on.

## 2018-03-26 ENCOUNTER — Other Ambulatory Visit: Payer: Self-pay | Admitting: Internal Medicine

## 2018-03-28 NOTE — Telephone Encounter (Signed)
Forms have been signed, faxed to [place] @ [phone], copy sent to scan.  Orignial mailed to patient.

## 2018-04-22 ENCOUNTER — Other Ambulatory Visit: Payer: Self-pay | Admitting: Cardiology

## 2018-04-30 ENCOUNTER — Other Ambulatory Visit: Payer: Self-pay | Admitting: Internal Medicine

## 2018-05-04 ENCOUNTER — Other Ambulatory Visit: Payer: Self-pay | Admitting: Internal Medicine

## 2018-05-09 ENCOUNTER — Other Ambulatory Visit: Payer: Self-pay | Admitting: Internal Medicine

## 2018-05-25 IMAGING — CR DG CHEST 1V PORT
1 series · 1 of 1 positions shown · non-contrast
Comparison: Portable exam 3439 hours compared to 12/12/2015

CLINICAL DATA: Substernal chest pain today while mowing the grass,
code STEMI, history smoking

EXAM:
PORTABLE CHEST 1 VIEW

[AP]
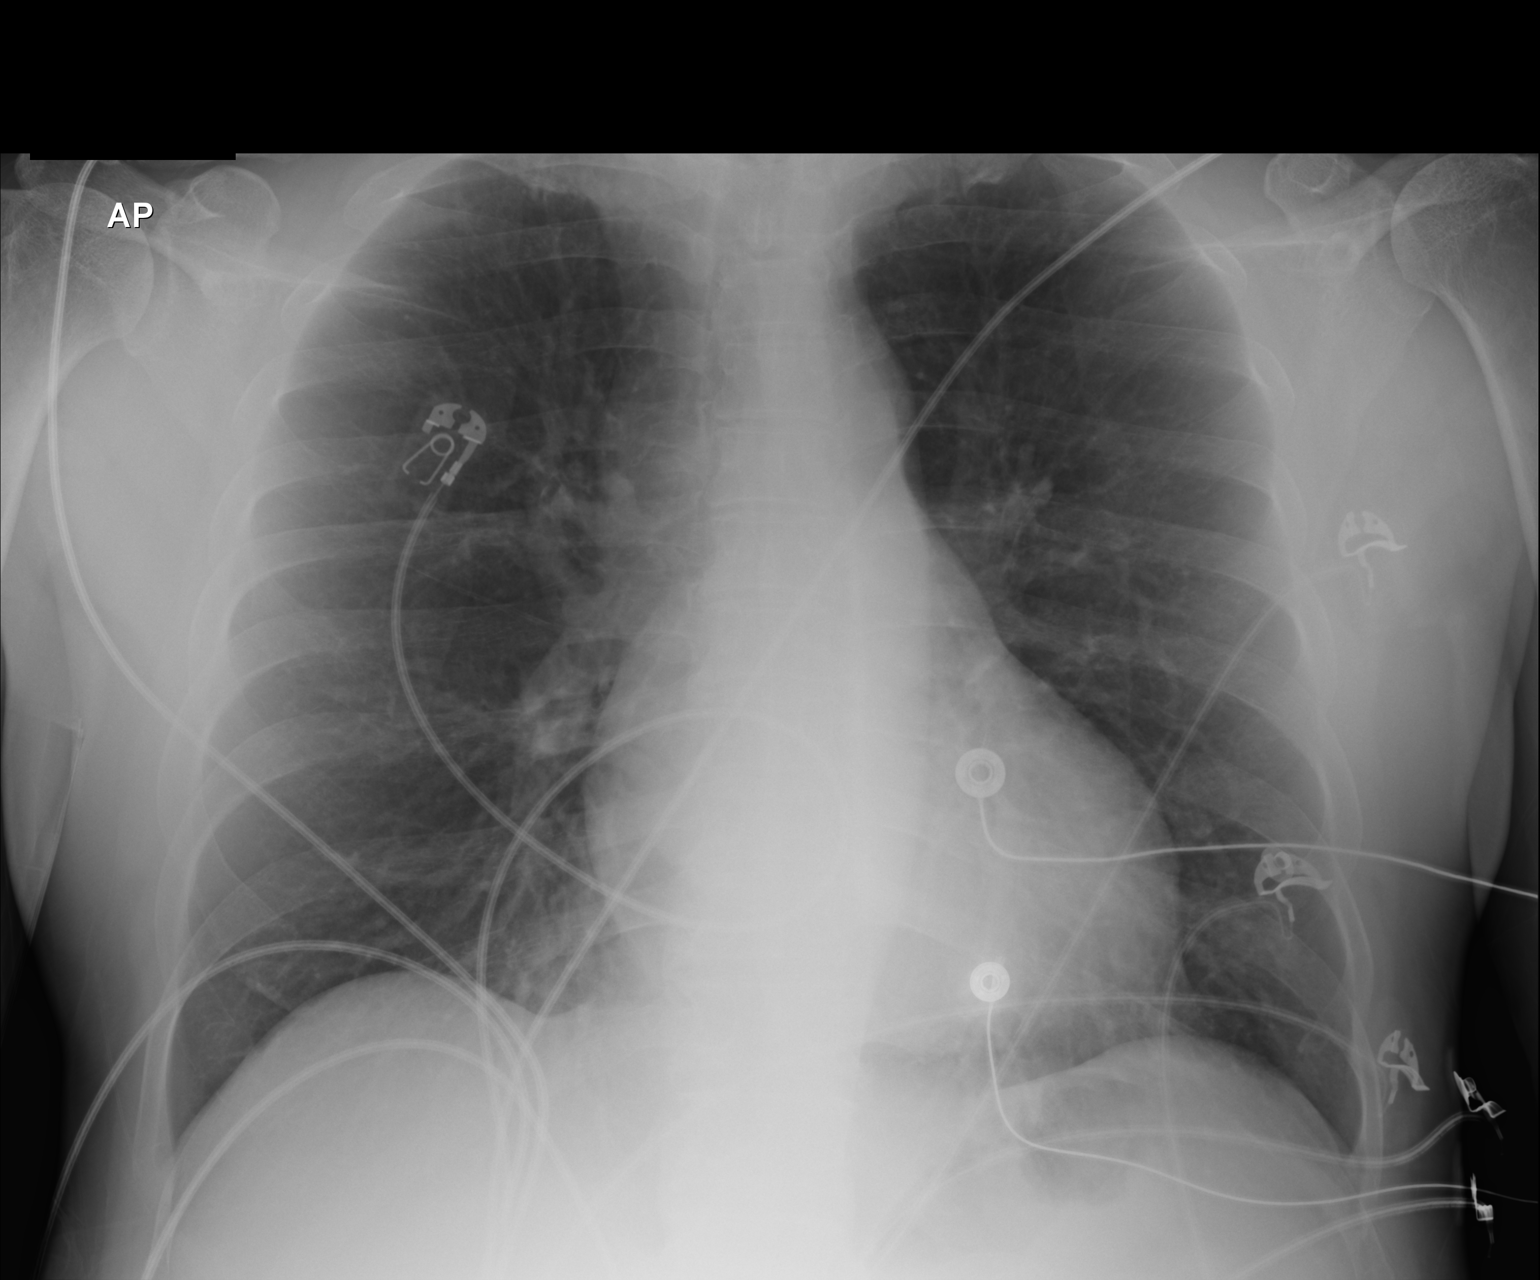

[1 of 1 positions shown; findings below may reference images not displayed]

FINDINGS: Normal heart size, mediastinal contours, and pulmonary vascularity.

Lungs clear.

No pleural effusion or pneumothorax.

Bones unremarkable.
IMPRESSION: No acute abnormalities.

## 2018-05-26 ENCOUNTER — Other Ambulatory Visit: Payer: Self-pay | Admitting: Cardiology

## 2018-06-03 ENCOUNTER — Encounter: Payer: Self-pay | Admitting: Cardiology

## 2018-06-03 ENCOUNTER — Ambulatory Visit: Payer: 59 | Admitting: Cardiology

## 2018-06-03 VITALS — BP 86/52 | HR 63 | Ht 67.0 in | Wt 160.0 lb

## 2018-06-03 DIAGNOSIS — I213 ST elevation (STEMI) myocardial infarction of unspecified site: Secondary | ICD-10-CM | POA: Diagnosis not present

## 2018-06-03 DIAGNOSIS — I952 Hypotension due to drugs: Secondary | ICD-10-CM | POA: Diagnosis not present

## 2018-06-03 DIAGNOSIS — E785 Hyperlipidemia, unspecified: Secondary | ICD-10-CM

## 2018-06-03 DIAGNOSIS — I25110 Atherosclerotic heart disease of native coronary artery with unstable angina pectoris: Secondary | ICD-10-CM | POA: Diagnosis not present

## 2018-06-03 DIAGNOSIS — I5042 Chronic combined systolic (congestive) and diastolic (congestive) heart failure: Secondary | ICD-10-CM

## 2018-06-03 MED ORDER — CARVEDILOL 3.125 MG PO TABS: ORAL_TABLET | ORAL | 0 refills | Status: DC

## 2018-06-03 NOTE — Progress Notes (Signed)
PCP: Plotnikov, Evie Lacks, MD  Clinic Note: Chief Complaint  Patient presents with  . Follow-up    no complaints  . Coronary Artery Disease  . Cardiomyopathy    HPI: Blake [name] is a 62 y.o. male with a PMH below who presents today for 2 month f/u for CAD-Ischemic CM following large anterior STEMI.   Blake [name] was last seen on 12/10/2017 - f/u from Echo.  was doing well -> referred to EP for ICD evaluation. Indicated that he wanted to think it over (due to see back in Oct.    Recent Hospitalizations: None  Studies Personally Reviewed - (if available, images/films reviewed: From Epic Chart or Care Everywhere)     High resistant flow in left vertebral artery but otherwise normal carotids and subclavian's bilaterally.  Interval History: Blake [name] returns today still feeling quite well.  He recently went back to [place] to visit family over the summertime.  He continues to be active exercising and working most of the days.  He denies any angina or heart failure symptoms of chest tightness/pressure with rest or exertion.  No exertional dyspnea.  No PND, orthopnea or edema.  No rapid irregular heartbeats or palpitations.  No syncope/near syncope or TIA/amaurosis fugax.  He does note in the morning that his blood pressures are somewhat low after taking his medications, but denies any dizziness or wooziness associated with it.   The he denies any melena, hematochezia, evaluated with hematuria, or epistaxis.     ROS: A comprehensive was performed. Review of Systems  Constitutional: Negative for chills, fever, malaise/fatigue and weight loss (He has been adjusting his diet and exercising, but not actively trying to lose weight).  HENT: Negative for congestion and nosebleeds.   Respiratory: Negative for cough, shortness of breath and wheezing.   Cardiovascular: Negative for chest pain, palpitations and leg swelling.  Gastrointestinal: Negative for abdominal pain and heartburn.    Genitourinary: Negative for dysuria.  Musculoskeletal: Negative for joint pain and myalgias.  Neurological: Negative for dizziness and weakness. Focal weakness:    Psychiatric/Behavioral: Negative for depression and memory loss. The patient is not nervous/anxious.   All other systems reviewed and are negative.  I have reviewed and (if needed) personally updated the patient's problem list, medications, allergies, past medical and surgical history, social and family history.   Past Medical History:  Diagnosis Date  . CAD (coronary artery disease), native coronary artery 01/2017   a. 01/2017: anterior STEMI with 100% occlusion of the prox-mid LAD with 95% distal LAD stenosis due to distal embolization of thrombus. A Synergy DES was placed to the prox-LAD.  Marland Kitchen Cancer [facility]) 2009   brain tumor  . Hyperlipemia   . Ischemic cardiomyopathy    a. 01/2017: initial echo showed a reduced EF of 30-35% with repeat echo of 40-45%. -->  However follow-up echo showed EF of 30-35%, EF confirmed to be 30% by MRI October 2018.  Marland Kitchen LUMBAR RADICULOPATHY, RIGHT 07/02/2008   Qualifier: Diagnosis of  By: Alain Marion MD, Evie Lacks Memory loss 07/05/2008   Qualifier: Diagnosis of  By: Alain Marion MD, [place] 07/05/2008   Qualifier: Diagnosis of  By: Alain Marion MD, Evie Lacks Neoplasm of unspecified nature of brain 08/18/2008   Qualifier: Diagnosis of  By: Alain Marion MD, Evie Lacks Rash and other nonspecific skin eruption 08/18/2008   Qualifier: Diagnosis of  By: Doralee Albino    . ST elevation  myocardial infarction (STEMI) of anterior wall ([place]) 01/2017   Large anterior infarct.  . TOBACCO USER 11/28/2009   Qualifier: Diagnosis of  By: Alain Marion MD, Evie Lacks   . VITAMIN D DEFICIENCY 07/02/2008   Qualifier: Diagnosis of  By: Plotnikov MD, Evie Lacks     Past Surgical History:  Procedure Laterality Date  . CORONARY STENT INTERVENTION N/A 02/23/2017   Procedure: Coronary Stent  Intervention;  Surgeon: Leonie Man, MD;  Location: [place] CV LAB;  Service: Cardiovascular;  Laterality: N/A;  . CORONARY/GRAFT ACUTE MI REVASCULARIZATION N/A 02/23/2017   Procedure: Coronary/Graft Acute MI Revascularization;  Surgeon: Leonie Man, MD;  Location: [place] CV LAB;  Service: Cardiovascular;  Laterality: N/A;  . CRANIECTOMY / [place] TUMOR  2009   surgery and XRT at [facility]  . LEFT HEART CATH AND CORONARY ANGIOGRAPHY N/A 02/23/2017   Procedure: Left Heart Cath and Coronary Angiography;  Surgeon: Leonie Man, MD;  Location: [place] CV LAB;  Service: Cardiovascular;  Laterality: N/A;  . TRANSTHORACIC ECHOCARDIOGRAM  11/2017   Anterior , septal, apical and inferior apical hypokinesis. The cavity size was moderately dilated. Wall     2 DEcho 12/07/2017:  25-30%. Severely reduced function. Anterior , septal, apical and inferior apical hypokinesis. The cavity size was moderately dilated. Wall  thickness was normal. Systolic function was severely reduced. Mild AoV calcification  Post-Intervention Diagram 01/2017     Current Meds  Medication Sig  . aspirin 81 MG chewable tablet Chew 1 tablet (81 mg total) by mouth daily.  Marland Kitchen atorvastatin (LIPITOR) 40 MG tablet Take 40 mg by mouth daily.  . carvedilol (COREG) 3.125 MG tablet TAKE  1/2TABLET OF 3.125 MG IN MORNING AND 3.125 MG IN THE EVENING  . Cholecalciferol (VITAMIN D3) 2000 units capsule Take 1 capsule (2,000 Units total) by mouth daily.  . D3-50 50000 units capsule TAKE ONE CAPSULE BY MOUTH ONCE A WEEK. SWITCH TO OTC VITAMIN D3 2000 IU WHEN FINISHED  . ENTRESTO 49-51 MG TAKE 1 TABLET BY MOUTH TWICE A DAY  . nitroGLYCERIN (NITROSTAT) 0.4 MG SL tablet Place 1 tablet (0.4 mg total) under the tongue every 5 (five) minutes as needed.  . ticagrelor (BRILINTA) 60 MG TABS tablet Take 1 tablet (60 mg total) by mouth 2 (two) times daily.  . [DISCONTINUED] carvedilol (COREG) 3.125 MG tablet TAKE  1 TABLET (3.125 MG TOTAL) BY MOUTH 2 (TWO) TIMES DAILY WITH A MEAL.    No Known Allergies  Social History   Tobacco Use  . Smoking status: Former Smoker    Packs/day: 0.07    Types: Cigarettes    Last attempt to quit: 03/31/2017    Years since quitting: 1.1  . Smokeless tobacco: Never Used  Substance Use Topics  . Alcohol use: No  . Drug use: No   Social History   Social History Narrative   Very active. 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 He also walks daily after work for at least 30-45 minutes. On weekends he tries to walk at least an hour. This is usually a fast walk or slow jog.      He quit smoking shortly after his MI.    family history includes Alzheimer's disease in his father; Arthritis in his mother; Goiter in his mother; Hypertension in his other; Parkinsonism in his father.  Wt Readings from Last 3 Encounters:  06/03/18 160 lb (72.6 kg)  01/24/18 162 lb (73.5 kg)  12/24/17  165 lb (74.8 kg)    PHYSICAL EXAM BP (!) 86/52   Pulse 63   Ht 5\' 7"  (1.702 m)   Wt 160 lb (72.6 kg)   BMI 25.06 kg/m  Physical Exam  Constitutional: He is oriented to person, place, and time. He appears well-developed and well-nourished. No distress.  Very healthy-appearing.  Well-groomed.  HENT:  Head: Normocephalic and atraumatic.  Mouth/Throat: No oropharyngeal exudate.  Neck: No hepatojugular reflux and no JVD present. Carotid bruit is not present. No thyromegaly present.  Cardiovascular: Normal rate, regular rhythm and intact distal pulses.  No extrasystoles are present. PMI is not displaced. Exam reveals no gallop and no friction rub.  No murmur (Cannot exclude soft 1/6 SEM at RUSB.) heard. Pulmonary/Chest: Effort normal and breath sounds normal. No respiratory distress. He has no wheezes. He has no rales.  Abdominal: Soft. Bowel sounds are normal. He exhibits no distension. There is no tenderness.  Musculoskeletal: Normal range of motion. He exhibits no edema.    Neurological: He is alert and oriented to person, place, and time.  Psychiatric: He has a normal mood and affect. His behavior is normal. Judgment and thought content normal.  He has a somewhat intense albeit very pleasant affect.  He is very Chief Financial Officer like  Nursing note and vitals reviewed.    Adult ECG Report Normal sinus rhythm, rate 63 bpm.  Incomplete right bundle branch block.  Anteroseptal infarct, age undetermined with biphasic ST and T wave of normality, can consider lateral ischemia.  Of note this is no change since June 2018.  Other studies Reviewed: Additional studies/ records that were reviewed today include:  Recent Labs:  Lab Results  Component Value Date   CHOL 116 01/24/2018   HDL 43.10 01/24/2018   LDLCALC 58 01/24/2018   LDLDIRECT 161.0 05/14/2012   TRIG 74.0 01/24/2018   CHOLHDL 3 01/24/2018   Lab Results  Component Value Date   CREATININE 0.90 01/24/2018   BUN 18 01/24/2018   NA 142 01/24/2018   K 5.1 01/24/2018   CL 107 01/24/2018   CO2 26 01/24/2018   Lab Results  Component Value Date   HGBA1C 5.7 (H) 02/23/2017    ASSESSMENT / PLAN: Problem List Items Addressed This Visit    Chronic combined systolic and diastolic heart failure, NYHA class 1 (HCC) (Chronic)    Asymptomatic.  Euvolemic.  Continues to exercise with walking and jogging.   With low blood pressure, will need to reduce his morning dose of carvedilol.  Unable to further titrate because of hypotension and bradycardia.  Is now on intermediate dose Entresto -also unable to hydrate      Relevant Medications   atorvastatin (LIPITOR) 40 MG tablet   carvedilol (COREG) 3.125 MG tablet   Coronary artery disease involving native coronary artery of native heart with unstable angina pectoris (HCC) (Chronic)    Large anterior MI with proximal LAD occlusion and apical subtotal occlusion.  Now status post LAD DES stent. On low-dose maintenance Brilinta.  With no bleeding issues, would simply continue  Brilinta without aspirin.  Would be okay to stop for procedures (5 days preop).      Relevant Medications   atorvastatin (LIPITOR) 40 MG tablet   carvedilol (COREG) 3.125 MG tablet   Hyperlipidemia LDL goal <70 (Chronic)    Recent lipids pretty much close to goal on current dose of statin.  No change.  No myalgias.      Relevant Medications   atorvastatin (LIPITOR) 40 MG tablet  carvedilol (COREG) 3.125 MG tablet   Hypotension due to drugs    Morning blood pressures seem to be low.  He seems to be relatively a symptomatically however I am worried about pressures below 90.  We will reduce morning dose of carvedilol to one half dose and have him make sure he takes it twice daily.      Relevant Medications   atorvastatin (LIPITOR) 40 MG tablet   carvedilol (COREG) 3.125 MG tablet   Other Relevant Orders   EKG 12-Lead   ST elevation myocardial infarction (STEMI), subsequent episode of care ([place]) - Primary (Chronic)   Relevant Medications   atorvastatin (LIPITOR) 40 MG tablet   carvedilol (COREG) 3.125 MG tablet   Other Relevant Orders   EKG 12-Lead      Current medicines are reviewed at length with the patient today. (+/- concerns) none The following changes have been made: Reduce morning dose of carvedilol to one half tab . Patient Instructions  MEDICATION CHANGES   DECREASE CARVEDILOL  ( 1/2 TABLET OF 3.125 MG  ) IN THE MORNING  AND 3.125 MG IN THE EVENING.   NO OTHER CHANGES    Your physician wants you to follow-up in [place]. You will receive a reminder letter in the mail two months in advance. If you don't receive a letter, please call our office to schedule the follow-up appointment.    If you need a refill on your cardiac medications before your next appointment, please call your pharmacy.    Studies Ordered:   Orders Placed This Encounter  Procedures  . EKG 12-Lead      Glenetta Hew, M.D., M.S. Interventional Cardiologist   Pager  # [phone] Phone # [phone] [address] [place], [place] [address]   Thank you for choosing [facility] at [facility]!!

## 2018-06-03 NOTE — Assessment & Plan Note (Signed)
Status post sizable anterior infarct with significantly reduced EF and large anterior scar.  Despite this, he is essentially asymptomatic with no heart failure symptoms. On low-dose carvedilol, but with hypotension will reduce morning dose to one half tab. On Entresto, for now with no adverse symptoms of low blood pressures we will continue current dose, but low threshold for reducing dose.

## 2018-06-03 NOTE — Patient Instructions (Addendum)
MEDICATION CHANGES   DECREASE CARVEDILOL  ( 1/2 TABLET OF 3.125 MG  ) IN THE MORNING  AND 3.125 MG IN THE EVENING.   NO OTHER CHANGES    Your physician wants you to follow-up in [place]. You will receive a reminder letter in the mail two months in advance. If you don't receive a letter, please call our office to schedule the follow-up appointment.    If you need a refill on your cardiac medications before your next appointment, please call your pharmacy.

## 2018-06-03 NOTE — Assessment & Plan Note (Signed)
Recent lipids pretty much close to goal on current dose of statin.  No change.  No myalgias.

## 2018-06-03 NOTE — Assessment & Plan Note (Signed)
Asymptomatic.  Euvolemic.  Continues to exercise with walking and jogging.   With low blood pressure, will need to reduce his morning dose of carvedilol.  Unable to further titrate because of hypotension and bradycardia.  Is now on intermediate dose Entresto -also unable to hydrate

## 2018-06-03 NOTE — Assessment & Plan Note (Signed)
Large anterior MI with proximal LAD occlusion and apical subtotal occlusion.  Now status post LAD DES stent. On low-dose maintenance Brilinta.  With no bleeding issues, would simply continue Brilinta without aspirin.  Would be okay to stop for procedures (5 days preop).

## 2018-06-03 NOTE — Assessment & Plan Note (Signed)
Morning blood pressures seem to be low.  He seems to be relatively a symptomatically however I am worried about pressures below 90.  We will reduce morning dose of carvedilol to one half dose and have him make sure he takes it twice daily.

## 2018-06-04 ENCOUNTER — Ambulatory Visit: Payer: 59 | Admitting: Cardiology

## 2018-06-06 ENCOUNTER — Other Ambulatory Visit: Payer: Self-pay | Admitting: Internal Medicine

## 2018-07-05 ENCOUNTER — Other Ambulatory Visit: Payer: Self-pay | Admitting: Internal Medicine

## 2018-07-09 ENCOUNTER — Encounter: Payer: Self-pay | Admitting: Cardiology

## 2018-07-09 ENCOUNTER — Ambulatory Visit: Payer: 59 | Admitting: Cardiology

## 2018-07-09 VITALS — BP 102/62 | HR 66 | Wt 159.0 lb

## 2018-07-09 DIAGNOSIS — I255 Ischemic cardiomyopathy: Secondary | ICD-10-CM | POA: Diagnosis not present

## 2018-07-09 DIAGNOSIS — I2510 Atherosclerotic heart disease of native coronary artery without angina pectoris: Secondary | ICD-10-CM

## 2018-07-09 DIAGNOSIS — E785 Hyperlipidemia, unspecified: Secondary | ICD-10-CM

## 2018-07-09 NOTE — Patient Instructions (Signed)
Medication Instructions:  Your physician recommends that you continue on your current medications as directed. Please refer to the Current Medication list given to you today.  If you need a refill on your cardiac medications before your next appointment, please call your pharmacy.   Lab work: None ordered  Testing/Procedures: None ordered  Follow-Up: At [facility], you and your health needs are our priority.  As part of our continuing mission to provide you with exceptional heart care, we have created designated Provider Care Teams.  These Care Teams include your primary Cardiologist (physician) and Advanced Practice Providers (APPs -  Physician Assistants and Nurse Practitioners) who all work together to provide you with the care you need, when you need it. You will need a follow up appointment in 4 months.  Please call our office 2 months in advance to schedule this appointment.  You may see Will Meredith Leeds, MD or one of the following Advanced Practice Providers on your designated Care Team:   Chanetta Marshall, NP . Tommye Standard, PA-C  Thank you for choosing [facility]!!   Trinidad Curet, RN [phone]

## 2018-07-09 NOTE — Progress Notes (Signed)
Electrophysiology Office Note   Date:  07/09/2018   ID:  Blake [name], DOB 1956-04-01, MRN [id number]  PCP:  Cassandria Anger, MD  Cardiologist:  Ellyn Hack Primary Electrophysiologist:  Yalanda Soderman Meredith Leeds, MD    No chief complaint on file.    History of Present Illness: Blake [name] is a 62 y.o. male who is being seen today for the evaluation of CHF at the request of Plotnikov, Evie Lacks, MD. Presenting today for electrophysiology evaluation.  He has a history of coronary disease status post anterior STEMI with LAD PCI and ischemic cardiomyopathy.  Most recent echo showed an ejection fraction of 25-30%.  He had a cardiac MRI that showed extensive anterior scar transmural, as well as an ejection fraction of 32%.  He has been working with cardiac rehab since his MI.    Today, denies symptoms of palpitations, chest pain, shortness of breath, orthopnea, PND, lower extremity edema, claudication, dizziness, presyncope, syncope, bleeding, or neurologic sequela. The patient is tolerating medications without difficulties.  Overall he is doing well.  He has no chest pain or shortness of breath.  He is able to do all of his daily activities.  He is able to exercise without limitation.   Past Medical History:  Diagnosis Date  . CAD (coronary artery disease), native coronary artery 01/2017   a. 01/2017: anterior STEMI with 100% occlusion of the prox-mid LAD with 95% distal LAD stenosis due to distal embolization of thrombus. A Synergy DES was placed to the prox-LAD.  Marland Kitchen Cancer [facility]) 2009   brain tumor  . Hyperlipemia   . Ischemic cardiomyopathy    a. 01/2017: initial echo showed a reduced EF of 30-35% with repeat echo of 40-45%. -->  However follow-up echo showed EF of 30-35%, EF confirmed to be 30% by MRI October 2018.  Marland Kitchen LUMBAR RADICULOPATHY, RIGHT 07/02/2008   Qualifier: Diagnosis of  By: Alain Marion MD, Evie Lacks Memory loss 07/05/2008   Qualifier: Diagnosis of  By: Alain Marion MD, [place] 07/05/2008   Qualifier: Diagnosis of  By: Alain Marion MD, Evie Lacks Neoplasm of unspecified nature of brain 08/18/2008   Qualifier: Diagnosis of  By: Alain Marion MD, Evie Lacks Rash and other nonspecific skin eruption 08/18/2008   Qualifier: Diagnosis of  By: Doralee Albino    . ST elevation myocardial infarction (STEMI) of anterior wall ([place]) 01/2017   Large anterior infarct.  . TOBACCO USER 11/28/2009   Qualifier: Diagnosis of  By: Alain Marion MD, Evie Lacks   . VITAMIN D DEFICIENCY 07/02/2008   Qualifier: Diagnosis of  By: Plotnikov MD, Evie Lacks    Past Surgical History:  Procedure Laterality Date  . CORONARY STENT INTERVENTION N/A 02/23/2017   Procedure: Coronary Stent Intervention;  Surgeon: Leonie Man, MD;  Location: [place] CV LAB;  Service: Cardiovascular;  Laterality: N/A;  . CORONARY/GRAFT ACUTE MI REVASCULARIZATION N/A 02/23/2017   Procedure: Coronary/Graft Acute MI Revascularization;  Surgeon: Leonie Man, MD;  Location: [place] CV LAB;  Service: Cardiovascular;  Laterality: N/A;  . CRANIECTOMY / [place] TUMOR  2009   surgery and XRT at [facility]  . LEFT HEART CATH AND CORONARY ANGIOGRAPHY N/A 02/23/2017   Procedure: Left Heart Cath and Coronary Angiography;  Surgeon: Leonie Man, MD;  Location: [place] CV LAB;  Service: Cardiovascular;  Laterality: N/A;  . TRANSTHORACIC ECHOCARDIOGRAM  11/2017   Anterior , septal, apical and  inferior apical hypokinesis. The cavity size was moderately dilated. Wall      Current Outpatient Medications  Medication Sig Dispense Refill  . aspirin 81 MG chewable tablet Chew 1 tablet (81 mg total) by mouth daily.    Marland Kitchen atorvastatin (LIPITOR) 40 MG tablet Take 40 mg by mouth daily.    . carvedilol (COREG) 3.125 MG tablet TAKE  1/2TABLET OF 3.125 MG IN MORNING AND 3.125 MG IN THE EVENING 180 tablet 0  . Cholecalciferol (VITAMIN D3) 2000 units capsule Take 1 capsule (2,000 Units  total) by mouth daily. 100 capsule 3  . ENTRESTO 49-51 MG TAKE 1 TABLET BY MOUTH TWICE A DAY 180 tablet 0  . nitroGLYCERIN (NITROSTAT) 0.4 MG SL tablet Place 1 tablet (0.4 mg total) under the tongue every 5 (five) minutes as needed. 25 tablet 3  . ticagrelor (BRILINTA) 60 MG TABS tablet Take 1 tablet (60 mg total) by mouth 2 (two) times daily. 180 tablet 3   No current facility-administered medications for this visit.     Allergies:   Patient has no known allergies.   Social History:  The patient  reports that he quit smoking about 15 months ago. His smoking use included cigarettes. He smoked 0.07 packs per day. He has never used smokeless tobacco. He reports that he does not drink alcohol or use drugs.   Family History:  The patient's family history includes Alzheimer's disease in his father; Arthritis in his mother; Goiter in his mother; Hypertension in his other; Parkinsonism in his father.   ROS:  Please see the history of present illness.   Otherwise, review of systems is positive for none.   All other systems are reviewed and negative.   PHYSICAL EXAM: VS:  BP 102/62 Comment: after waiting several minutes seated and relaxed  Pulse 66   Wt 159 lb (72.1 kg)   SpO2 99%   BMI 24.90 kg/m  , BMI Body mass index is 24.9 kg/m. GEN: Well nourished, well developed, in no acute distress  HEENT: normal  Neck: no JVD, carotid bruits, or masses Cardiac: RRR; no murmurs, rubs, or gallops,no edema  Respiratory:  clear to auscultation bilaterally, normal work of breathing GI: soft, nontender, nondistended, + BS MS: no deformity or atrophy  Skin: warm and dry Neuro:  Strength and sensation are intact Psych: euthymic mood, full affect  EKG:  EKG is not ordered today. Personal review of the ekg ordered 06/03/18 shows this rhythm, incomplete right bundle branch block, anteroseptal infarct  Recent Labs: 01/24/2018: ALT 17; BUN 18; Creatinine, Ser 0.90; Hemoglobin 13.2; Platelets 239.0; Potassium  5.1; Sodium 142; TSH 0.40    Lipid Panel     Component Value Date/Time   CHOL 116 01/24/2018 1028   CHOL 122 09/11/2017 1149   TRIG 74.0 01/24/2018 1028   HDL 43.10 01/24/2018 1028   HDL 44 09/11/2017 1149   CHOLHDL 3 01/24/2018 1028   VLDL 14.8 01/24/2018 1028   LDLCALC 58 01/24/2018 1028   LDLCALC 61 09/11/2017 1149   LDLDIRECT 161.0 05/14/2012 0739     Wt Readings from Last 3 Encounters:  07/09/18 159 lb (72.1 kg)  06/03/18 160 lb (72.6 kg)  01/24/18 162 lb (73.5 kg)      Other studies Reviewed: Additional studies/ records that were reviewed today include: TTE 12/03/17 Review of the above records today demonstrates:  - Left ventricle: Anterior , septal, apical and inferior apical   hypokinesis. The cavity size was moderately dilated. Wall   thickness  was normal. Systolic function was severely reduced. The   estimated ejection fraction was in the range of 25% to 30%. Left   ventricular diastolic function parameters were normal. - Mitral valve: There was mild regurgitation. - Left atrium: The atrium was mildly dilated. - Atrial septum: No defect or patent foramen ovale was identified.  CMRI 07/03/17 1) Moderate LVE with evidence for large previous anterior MI EF 32%  2) Delayed gadolinium images show full thickness scar involving the mid and distal anterior wall, septum apex and inferior apex  3) Mild LAE  4) Normal RV  The patient would appear to have a large substrate for re entrant VT and be a candidate for AICD  ASSESSMENT AND PLAN:  1.  Chronic combined systolic and diastolic heart failure due to ischemic cardiomyopathy: Coreg and Entresto.  He is feeling well without major complaint.  He is able to do all of his daily activities.  At this point, he is not ready to have an ICD implanted.  He would prefer to wait until after the holidays.  I Blake [name] see him back in February.  2.  Coronary artery disease status post anterior STEMI: Status post drug-eluting  stent.  Has a large transmural scar on MRI.  No current chest pain.  Continue dual antiplatelet therapy.  3.  Hyperlipidemia: LDL goal less than 70.  Plan per primary cardiology.  On atorvastatin.    Current medicines are reviewed at length with the patient today.   The patient does not have concerns regarding his medicines.  The following changes were made today: None  Labs/ tests ordered today include:  No orders of the defined types were placed in this encounter.   Disposition:   FU with Aleyah Balik for months  Signed, Blake [name] Meredith Leeds, MD  07/09/2018 10:52 AM     [facility] [address] [place] [address] [phone] (office) [phone] (fax)

## 2018-08-04 DIAGNOSIS — H2513 Age-related nuclear cataract, bilateral: Secondary | ICD-10-CM | POA: Diagnosis not present

## 2018-08-04 DIAGNOSIS — H40033 Anatomical narrow angle, bilateral: Secondary | ICD-10-CM | POA: Diagnosis not present

## 2018-08-08 ENCOUNTER — Other Ambulatory Visit: Payer: Self-pay | Admitting: Cardiology

## 2018-08-08 NOTE — Telephone Encounter (Signed)
Rx request sent to pharmacy.  

## 2018-08-18 ENCOUNTER — Other Ambulatory Visit: Payer: Self-pay | Admitting: Cardiology

## 2018-10-28 ENCOUNTER — Other Ambulatory Visit: Payer: Self-pay | Admitting: Cardiology

## 2018-12-08 ENCOUNTER — Encounter: Payer: Self-pay | Admitting: Cardiology

## 2018-12-08 ENCOUNTER — Ambulatory Visit: Payer: 59 | Admitting: Cardiology

## 2018-12-08 VITALS — BP 113/67 | HR 62 | Ht 67.0 in | Wt 171.4 lb

## 2018-12-08 DIAGNOSIS — I2510 Atherosclerotic heart disease of native coronary artery without angina pectoris: Secondary | ICD-10-CM | POA: Diagnosis not present

## 2018-12-08 DIAGNOSIS — I952 Hypotension due to drugs: Secondary | ICD-10-CM

## 2018-12-08 DIAGNOSIS — I255 Ischemic cardiomyopathy: Secondary | ICD-10-CM

## 2018-12-08 DIAGNOSIS — I5042 Chronic combined systolic (congestive) and diastolic (congestive) heart failure: Secondary | ICD-10-CM | POA: Diagnosis not present

## 2018-12-08 DIAGNOSIS — I213 ST elevation (STEMI) myocardial infarction of unspecified site: Secondary | ICD-10-CM | POA: Diagnosis not present

## 2018-12-08 DIAGNOSIS — E785 Hyperlipidemia, unspecified: Secondary | ICD-10-CM

## 2018-12-08 MED ORDER — TICAGRELOR 60 MG PO TABS: 60.0000 mg | ORAL_TABLET | Freq: Two times a day (BID) | ORAL | 3 refills | Status: DC

## 2018-12-08 NOTE — Progress Notes (Signed)
PCP: Plotnikov, Evie Lacks, MD  Clinic Note: Chief Complaint  Patient presents with  . Follow-up    No new complaints  . Coronary Artery Disease    History of large anterior STEMI  . Cardiomyopathy    Ischemic    HPI: Blake [name] is a 63 y.o. male with a PMH below who presents today for 2 month f/u for CAD-Ischemic CM (CMRI - extensive Anterior Scar. EF ~32%) following large anterior STEMI.  He has been referred to EP for ICD evaluation. Indicated that he wanted to think it over (due to see back in Oct.  Still not ready for ICD.   Blake [name] was last seen in Sept 2019- f/u from Echo.  was doing well -> Recent Hospitalizations: None  Studies Personally Reviewed - (if available, images/films reviewed: From Epic Chart or Care Everywhere)  none  Interval History: Blake [name] returns today still feeling quite well.  He has not been doing as much exercise as usual, but is still doing well walking with job.  He also tries to walk about an hour a day a week.  But he is not traveling as much because of concerns with healthcare to center. He never did cut down his carvedilol dose as recommended last time and is still is doing relatively well.  He has not necessarily noticed any dizziness or lightheadedness.  No fatigue associated with beta-blocker and low blood pressures.  No dizziness or wooziness.  He has no heart failure symptoms of PND, orthopnea and only has edema if he is on his feet or sitting for long period time.  No rapid irregular heartbeats or palpitations. No syncope/near syncope or TIA/amaurosis fugax. No bleeding issues on Brilinta.  Apparently he never did get his dosing switched to 60 mg, stating that his pharmacy is still giving him 90 mg.  No claudication  feeling quite well.  He recently went back to [place] to visit family over the summertime.  He continues to be active exercising and working most of the days.  He denies any angina or heart failure symptoms of chest  tightness/pressure with rest or exertion.  No exertional dyspnea.  No PND, orthopnea or edema.  No rapid irregular heartbeats or palpitations.  No syncope/near syncope or TIA/amaurosis fugax.  He does note in the morning that his blood pressures are somewhat low after taking his medications, but denies any dizziness or wooziness associated with it.   The he denies any melena, hematochezia, evaluated with hematuria, or epistaxis.     ROS: A comprehensive was performed. Review of Systems  Constitutional: Negative for chills, fever, malaise/fatigue and weight loss (Still watches his diet and exercises).  HENT: Negative for congestion and nosebleeds.   Respiratory: Negative for cough, shortness of breath and wheezing.   Cardiovascular: Negative for chest pain, palpitations and leg swelling.  Gastrointestinal: Negative for abdominal pain, blood in stool, heartburn and melena.  Genitourinary: Negative for dysuria and hematuria.  Musculoskeletal: Negative for joint pain and myalgias.  Neurological: Negative for dizziness and weakness. Focal weakness:    Psychiatric/Behavioral: Negative for depression and memory loss. The patient is not nervous/anxious.   All other systems reviewed and are negative.  I have reviewed and (if needed) personally updated the patient's problem list, medications, allergies, past medical and surgical history, social and family history.   Past Medical History:  Diagnosis Date  . CAD (coronary artery disease), native coronary artery 01/2017   a. 01/2017: anterior STEMI with 100% occlusion of the  prox-mid LAD with 95% distal LAD stenosis due to distal embolization of thrombus. A Synergy DES was placed to the prox-LAD.  Marland Kitchen Cancer [facility]) 2009   brain tumor  . Hyperlipemia   . Ischemic cardiomyopathy    a. 01/2017: initial echo showed a reduced EF of 30-35% with repeat echo of 40-45%. -->  However follow-up echo showed EF of 30-35%, EF confirmed to be 30% by MRI October 2018.    Marland Kitchen LUMBAR RADICULOPATHY, RIGHT 07/02/2008   Qualifier: Diagnosis of  By: Alain Marion MD, Evie Lacks Memory loss 07/05/2008   Qualifier: Diagnosis of  By: Alain Marion MD, [place] 07/05/2008   Qualifier: Diagnosis of  By: Alain Marion MD, Evie Lacks Neoplasm of unspecified nature of brain 08/18/2008   Qualifier: Diagnosis of  By: Alain Marion MD, Evie Lacks Rash and other nonspecific skin eruption 08/18/2008   Qualifier: Diagnosis of  By: Doralee Albino    . ST elevation myocardial infarction (STEMI) of anterior wall ([place]) 01/2017   Large anterior infarct.  . TOBACCO USER 11/28/2009   Qualifier: Diagnosis of  By: Alain Marion MD, Evie Lacks   . VITAMIN D DEFICIENCY 07/02/2008   Qualifier: Diagnosis of  By: Plotnikov MD, Evie Lacks     Past Surgical History:  Procedure Laterality Date  . CORONARY STENT INTERVENTION N/A 02/23/2017   Procedure: Coronary Stent Intervention;  Surgeon: Leonie Man, MD;  Location: [place] CV LAB;  Service: Cardiovascular;  Laterality: N/A;  . CORONARY/GRAFT ACUTE MI REVASCULARIZATION N/A 02/23/2017   Procedure: Coronary/Graft Acute MI Revascularization;  Surgeon: Leonie Man, MD;  Location: [place] CV LAB;  Service: Cardiovascular;  Laterality: N/A;  . CRANIECTOMY / [place] TUMOR  2009   surgery and [facility]  . LEFT HEART CATH AND CORONARY ANGIOGRAPHY N/A 02/23/2017   Procedure: Left Heart Cath and Coronary Angiography;  Surgeon: Leonie Man, MD;  Location: [place] CV [facility];  Service: Cardiovascular;  Laterality: N/A;  . TRANSTHORACIC ECHOCARDIOGRAM  11/2017   Anterior , septal, apical and inferior apical hypokinesis. The cavity size was moderately dilated. Wall     2 D Echo 12/07/2017:  25-30%. Severely reduced function. Anterior , septal, apical and inferior apical hypokinesis. The cavity size was moderately dilated. Wall  thickness was normal. Systolic function was severely reduced. Mild AoV  calcification  Post-Intervention Diagram 01/2017     Current Meds  Medication Sig  . aspirin 81 MG chewable tablet Chew 1 tablet (81 mg total) by mouth daily.  Marland Kitchen atorvastatin (LIPITOR) 40 MG tablet TAKE 1 TABLET BY MOUTH EVERY DAY  . carvedilol (COREG) 3.125 MG tablet TAKE 1 TABLET (3.125 MG TOTAL) BY MOUTH 2 (TWO) TIMES DAILY WITH A MEAL.  Marland Kitchen Cholecalciferol (VITAMIN D3) 2000 units capsule Take 1 capsule (2,000 Units total) by mouth daily.  Marland Kitchen ENTRESTO 49-51 MG TAKE 1 TABLET BY MOUTH TWICE A DAY  . nitroGLYCERIN (NITROSTAT) 0.4 MG SL tablet Place 1 tablet (0.4 mg total) under the tongue every 5 (five) minutes as needed.  . ticagrelor (BRILINTA) 60 MG TABS tablet Take 1 tablet (60 mg total) by mouth 2 (two) times daily.  . [DISCONTINUED] ticagrelor (BRILINTA) 60 MG TABS tablet Take 1 tablet (60 mg total) by mouth 2 (two) times daily.  . [DISCONTINUED] ticagrelor (BRILINTA) 60 MG TABS tablet Take 1 tablet (60 mg total) by mouth 2 (two) times daily.    No Known  Allergies  Social History   Tobacco Use  . Smoking status: Former Smoker    Packs/day: 0.07    Types: Cigarettes    Last attempt to quit: 03/31/2017    Years since quitting: 1.6  . Smokeless tobacco: Never Used  Substance Use Topics  . Alcohol use: No  . Drug use: No   Social History   Social History Narrative   Very active. 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 He also walks daily after work for at least 30-45 minutes. On weekends he tries to walk at least an hour. This is usually a fast walk or slow jog.      He quit smoking shortly after his MI.    family history includes Alzheimer's disease in his father; Arthritis in his mother; Goiter in his mother; Hypertension in an other family member; Parkinsonism in his father.  Wt Readings from Last 3 Encounters:  12/08/18 171 lb 6.4 oz (77.7 kg)  07/09/18 159 lb (72.1 kg)  06/03/18 160 lb (72.6 kg)    PHYSICAL EXAM BP 113/67   Pulse 62   Ht 5\' 7"  (1.702 m)   Wt  171 lb 6.4 oz (77.7 kg)   SpO2 97%   BMI 26.85 kg/m  Physical Exam  Constitutional: He is oriented to person, place, and time. He appears well-developed and well-nourished. No distress.  Very healthy-appearing.  Well-groomed.  HENT:  Head: Normocephalic and atraumatic.  Mouth/Throat: No oropharyngeal exudate.  Neck: Normal range of motion. Neck supple. No hepatojugular reflux and no JVD present. Carotid bruit is not present. No thyromegaly present.  Cardiovascular: Normal rate, regular rhythm and intact distal pulses.  No extrasystoles are present. PMI is not displaced. Exam reveals no gallop and no friction rub.  No murmur (Cannot exclude soft 1/6 SEM at RUSB.) heard. Pulmonary/Chest: Effort normal and breath sounds normal. No respiratory distress. He has no wheezes. He has no rales.  Abdominal: Soft. Bowel sounds are normal. He exhibits no distension. There is no abdominal tenderness.  Musculoskeletal: Normal range of motion.        General: No edema.  Neurological: He is alert and oriented to person, place, and time.  Psychiatric: He has a normal mood and affect. His behavior is normal. Judgment and thought content normal.  He has a somewhat intense albeit very pleasant affect.  He is very Chief Financial Officer like  Vitals reviewed.    Adult ECG Report Not checked   Other studies Reviewed: Additional studies/ records that were reviewed today include:  Recent Labs:  Due for labs in April with PCP.  Lab Results  Component Value Date   CHOL 116 01/24/2018   HDL 43.10 01/24/2018   LDLCALC 58 01/24/2018   LDLDIRECT 161.0 05/14/2012   TRIG 74.0 01/24/2018   CHOLHDL 3 01/24/2018   Lab Results  Component Value Date   CREATININE 0.90 01/24/2018   BUN 18 01/24/2018   NA 142 01/24/2018   K 5.1 01/24/2018   CL 107 01/24/2018   CO2 26 01/24/2018   Lab Results  Component Value Date   HGBA1C 5.7 (H) 02/23/2017    ASSESSMENT / PLAN: Problem List Items Addressed This Visit    Chronic  combined systolic and diastolic heart failure, NYHA class 1 (HCC) (Chronic)    Euvolemic.  Asymptomatic.  Class I at worst heart failure symptoms.  Exercises walking and jogging.  He has borderline blood pressure and therefore we cannot titrate up his carvedilol.  Effective backed off on  the last visit.  Continue low-dose carvedilol along with Entresto.  No diuretic requirement.      Coronary artery disease involving native coronary artery of native heart without angina pectoris - Primary (Chronic)    History of a large anterior STEMI with a proximal to mid LAD occlusion and distal LAD of 95% stenosis due to distal embolization.  Good return of flow with DES stent placement, however he was a late presenting MI and had a significant anterior wall scar noted on cardiac MRI.  He has not had any further anginal symptoms with rest or exertion and he is pretty active. He is no longer taking aspirin, but is on Brilinta (we will change to ensure the prescription is for 60 mg).  He is also on low-dose carvedilol plus Entresto and Lipitor.      Hyperlipidemia LDL goal <70 (Chronic)    His lipids have been well controlled on atorvastatin.  Due to be checked here in about a month.  We will follow-up lab results      Hypotension due to drugs    He has been having low blood pressures in the morning.  I recommend that he back off on his carvedilol dose one half dose if he does feel poorly.  He can simply skip a dose or just take a half a dose if he is feeling lightheaded or dizzy.      Ischemic cardiomyopathy (Chronic)    Large sizable anterior scar on cardiac MRI with EF of 32%.  Still in discussions with electrophysiology reference ICD placement.  I again encouraged him to consider it based on the size of the scar at the location.  He is still not quite ready to make that decision.  On excellent regimen with Entresto and carvedilol.  No diuretic requirement.  Blood pressure is not high enough to even  consider spironolactone      ST elevation myocardial infarction (STEMI), subsequent episode of care [facility]) (Chronic)    Large anterior infarct confirmed on cardiac MRI.         Current medicines are reviewed at length with the patient today. (+/- concerns) none The following changes have been made:  Patient Instructions  Medication Instructions:  Not needed If you need a refill on your cardiac medications before your next appointment, please call your pharmacy.   Lab work: Not needed If you have labs (blood work) drawn today and your tests are completely normal, you will receive your results only by: Marland Kitchen MyChart Message (if you have MyChart) OR . A paper copy in the mail If you have any lab test that is abnormal or we need to change your treatment, we will call you to review the results.  Testing/Procedures:not needed  Follow-Up: At [facility], you and your health needs are our priority.  As part of our continuing mission to provide you with exceptional heart care, we have created designated Provider Care Teams.  These Care Teams include your primary Cardiologist (physician) and Advanced Practice Providers (APPs -  Physician Assistants and Nurse Practitioners) who all work together to provide you with the care you need, when you need it. You will need a follow up appointment in 6 months SEPT 2020.  Please call our office 2 months in advance to schedule this appointment.  You may see Glenetta Hew, MD or one of the following Advanced Practice Providers on your designated Care Team:   Rosaria Ferries, PA-C . Jory Sims, DNP, ANP  Any Other Special Instructions  Will Be Listed Below (If Applicable).      Studies Ordered:   No orders of the defined types were placed in this encounter.     Glenetta Hew, M.D., M.S. Interventional Cardiologist   Pager # [phone] Phone # [phone] [address] [place], [place] [address]   Thank you for choosing  Heartcare at [facility]!!

## 2018-12-08 NOTE — Patient Instructions (Signed)
Medication Instructions:  Not needed If you need a refill on your cardiac medications before your next appointment, please call your pharmacy.   Lab work: Not needed If you have labs (blood work) drawn today and your tests are completely normal, you will receive your results only by: Marland Kitchen MyChart Message (if you have MyChart) OR . A paper copy in the mail If you have any lab test that is abnormal or we need to change your treatment, we will call you to review the results.  Testing/Procedures:not needed  Follow-Up: At [facility], you and your health needs are our priority.  As part of our continuing mission to provide you with exceptional heart care, we have created designated Provider Care Teams.  These Care Teams include your primary Cardiologist (physician) and Advanced Practice Providers (APPs -  Physician Assistants and Nurse Practitioners) who all work together to provide you with the care you need, when you need it. You will need a follow up appointment in 6 months SEPT 2020.  Please call our office 2 months in advance to schedule this appointment.  You may see Glenetta Hew, MD or one of the following Advanced Practice Providers on your designated Care Team:   Rosaria Ferries, PA-C . Jory Sims, DNP, ANP  Any Other Special Instructions Will Be Listed Below (If Applicable).

## 2018-12-09 ENCOUNTER — Encounter: Payer: Self-pay | Admitting: Cardiology

## 2018-12-09 NOTE — Assessment & Plan Note (Signed)
History of a large anterior STEMI with a proximal to mid LAD occlusion and distal LAD of 95% stenosis due to distal embolization.  Good return of flow with DES stent placement, however he was a late presenting MI and had a significant anterior wall scar noted on cardiac MRI.  He has not had any further anginal symptoms with rest or exertion and he is pretty active. He is no longer taking aspirin, but is on Brilinta (we will change to ensure the prescription is for 60 mg).  He is also on low-dose carvedilol plus Entresto and Lipitor.

## 2018-12-09 NOTE — Assessment & Plan Note (Signed)
Euvolemic.  Asymptomatic.  Class I at worst heart failure symptoms.  Exercises walking and jogging.  He has borderline blood pressure and therefore we cannot titrate up his carvedilol.  Effective backed off on the last visit.  Continue low-dose carvedilol along with Entresto.  No diuretic requirement.

## 2018-12-09 NOTE — Assessment & Plan Note (Signed)
His lipids have been well controlled on atorvastatin.  Due to be checked here in about a month.  We will follow-up lab results

## 2018-12-09 NOTE — Assessment & Plan Note (Signed)
Large anterior infarct confirmed on cardiac MRI.

## 2018-12-09 NOTE — Assessment & Plan Note (Signed)
Large sizable anterior scar on cardiac MRI with EF of 32%.  Still in discussions with electrophysiology reference ICD placement.  I again encouraged him to consider it based on the size of the scar at the location.  He is still not quite ready to make that decision.  On excellent regimen with Entresto and carvedilol.  No diuretic requirement.  Blood pressure is not high enough to even consider spironolactone

## 2018-12-09 NOTE — Assessment & Plan Note (Signed)
He has been having low blood pressures in the morning.  I recommend that he back off on his carvedilol dose one half dose if he does feel poorly.  He can simply skip a dose or just take a half a dose if he is feeling lightheaded or dizzy.

## 2019-01-27 ENCOUNTER — Encounter: Payer: 59 | Admitting: Internal Medicine

## 2019-02-13 DIAGNOSIS — D320 Benign neoplasm of cerebral meninges: Secondary | ICD-10-CM | POA: Diagnosis not present

## 2019-02-16 ENCOUNTER — Other Ambulatory Visit: Payer: Self-pay | Admitting: Cardiology

## 2019-03-16 ENCOUNTER — Encounter: Payer: Self-pay | Admitting: Internal Medicine

## 2019-03-16 ENCOUNTER — Other Ambulatory Visit: Payer: Self-pay

## 2019-03-16 ENCOUNTER — Ambulatory Visit (INDEPENDENT_AMBULATORY_CARE_PROVIDER_SITE_OTHER): Payer: 59 | Admitting: Internal Medicine

## 2019-03-16 VITALS — BP 124/78 | HR 61 | Temp 98.3°F | Ht 67.0 in | Wt 167.0 lb

## 2019-03-16 DIAGNOSIS — I255 Ischemic cardiomyopathy: Secondary | ICD-10-CM | POA: Diagnosis not present

## 2019-03-16 DIAGNOSIS — Z0000 Encounter for general adult medical examination without abnormal findings: Secondary | ICD-10-CM

## 2019-03-16 DIAGNOSIS — Z23 Encounter for immunization: Secondary | ICD-10-CM | POA: Diagnosis not present

## 2019-03-16 DIAGNOSIS — E559 Vitamin D deficiency, unspecified: Secondary | ICD-10-CM | POA: Diagnosis not present

## 2019-03-16 LAB — COLOGUARD: Cologuard: NEGATIVE

## 2019-03-16 NOTE — Assessment & Plan Note (Signed)
Labs Vit D 

## 2019-03-16 NOTE — Progress Notes (Signed)
Subjective:  Patient ID: Blake [name], male    DOB: 01/21/1956  Age: 63 y.o. MRN: [id number]  CC: No chief complaint on file.   HPI Shing-Ru Apachito presents for a well exam F/u meningioma, CAD Brain MRI last month ok Cologuard 2017  Outpatient Medications Prior to Visit  Medication Sig Dispense Refill   atorvastatin (LIPITOR) 40 MG tablet TAKE 1 TABLET BY MOUTH EVERY DAY 90 tablet 3   BRILINTA 90 MG TABS tablet TAKE 1 TABLET BY MOUTH TWICE A DAY 180 tablet 3   carvedilol (COREG) 3.125 MG tablet TAKE 1 TABLET (3.125 MG TOTAL) BY MOUTH 2 (TWO) TIMES DAILY WITH A MEAL. 180 tablet 3   Cholecalciferol (VITAMIN D3) 2000 units capsule Take 1 capsule (2,000 Units total) by mouth daily. 100 capsule 3   ENTRESTO 49-51 MG TAKE 1 TABLET BY MOUTH TWICE A DAY 180 tablet 3   nitroGLYCERIN (NITROSTAT) 0.4 MG SL tablet Place 1 tablet (0.4 mg total) under the tongue every 5 (five) minutes as needed. 25 tablet 3   aspirin 81 MG chewable tablet Chew 1 tablet (81 mg total) by mouth daily.     No facility-administered medications prior to visit.     ROS: Review of Systems  Constitutional: Negative for appetite change, fatigue and unexpected weight change.  HENT: Negative for congestion, nosebleeds, sneezing, sore throat and trouble swallowing.   Eyes: Negative for itching and visual disturbance.  Respiratory: Negative for cough.   Cardiovascular: Negative for chest pain, palpitations and leg swelling.  Gastrointestinal: Negative for abdominal distention, blood in stool, diarrhea and nausea.  Genitourinary: Negative for frequency and hematuria.  Musculoskeletal: Negative for back pain, gait problem, joint swelling and neck pain.  Skin: Negative for rash.  Neurological: Negative for dizziness, tremors, speech difficulty and weakness.  Psychiatric/Behavioral: Negative for agitation, dysphoric mood and sleep disturbance. The patient is not nervous/anxious.     Objective:  BP 124/78 (BP  Location: Left Arm, Patient Position: Sitting, Cuff Size: Normal)    Pulse 61    Temp 98.3 F (36.8 C) (Oral)    Ht 5\' 7"  (1.702 m)    Wt 167 lb (75.8 kg)    SpO2 98%    BMI 26.16 kg/m   BP Readings from Last 3 Encounters:  03/16/19 124/78  12/08/18 113/67  07/09/18 102/62    Wt Readings from Last 3 Encounters:  03/16/19 167 lb (75.8 kg)  12/08/18 171 lb 6.4 oz (77.7 kg)  07/09/18 159 lb (72.1 kg)    Physical Exam Constitutional:      General: He is not in acute distress.    Appearance: He is well-developed.     Comments: NAD  Eyes:     Conjunctiva/sclera: Conjunctivae normal.     Pupils: Pupils are equal, round, and reactive to light.  Neck:     Musculoskeletal: Normal range of motion.     Thyroid: No thyromegaly.     Vascular: No JVD.  Cardiovascular:     Rate and Rhythm: Normal rate and regular rhythm.     Heart sounds: Normal heart sounds. No murmur. No friction rub. No gallop.   Pulmonary:     Effort: Pulmonary effort is normal. No respiratory distress.     Breath sounds: Normal breath sounds. No wheezing or rales.  Chest:     Chest wall: No tenderness.  Abdominal:     General: Bowel sounds are normal. There is no distension.     Palpations: Abdomen is soft. There is  no mass.     Tenderness: There is no abdominal tenderness. There is no guarding or rebound.  Musculoskeletal: Normal range of motion.        General: No tenderness.  Lymphadenopathy:     Cervical: No cervical adenopathy.  Skin:    General: Skin is warm and dry.     Findings: No rash.  Neurological:     Mental Status: He is alert and oriented to person, place, and time.     Cranial Nerves: No cranial nerve deficit.     Motor: No abnormal muscle tone.     Coordination: Coordination normal.     Gait: Gait normal.     Deep Tendon Reflexes: Reflexes are normal and symmetric.  Psychiatric:        Behavior: Behavior normal.        Thought Content: Thought content normal.        Judgment: Judgment  normal.     Lab Results  Component Value Date   WBC 6.2 01/24/2018   HGB 13.2 01/24/2018   HCT 39.9 01/24/2018   PLT 239.0 01/24/2018   GLUCOSE 115 (H) 01/24/2018   CHOL 116 01/24/2018   TRIG 74.0 01/24/2018   HDL 43.10 01/24/2018   LDLDIRECT 161.0 05/14/2012   LDLCALC 58 01/24/2018   ALT 17 01/24/2018   AST 24 01/24/2018   NA 142 01/24/2018   K 5.1 01/24/2018   CL 107 01/24/2018   CREATININE 0.90 01/24/2018   BUN 18 01/24/2018   CO2 26 01/24/2018   TSH 0.40 01/24/2018   PSA 0.87 01/24/2018   INR 0.9 07/07/2008   HGBA1C 5.7 (H) 02/23/2017    Vas US Carotid  Result Date: 02/07/2018 Carotid Arterial Duplex Study Indications:  Office visit on 01/24/18, physician noted a right carotid bruit on               exam. Patient denies any cerebrovascular symptoms. Risk Factors: Hyperlipidemia, past history of smoking, coronary artery disease. Examination Guidelines: A complete evaluation includes B-mode imaging, spectral doppler, color doppler, and power doppler as needed of all accessible portions of each vessel. Bilateral testing is considered an integral part of a complete examination. Limited examinations for reoccurring indications may be performed as noted.  Right Carotid Findings: +----------+--------+--------+--------+------------+--------+             PSV cm/s EDV cm/s Stenosis Describe     Comments  +----------+--------+--------+--------+------------+--------+  CCA Prox   103      27                                       +----------+--------+--------+--------+------------+--------+  CCA Distal 71       24                                       +----------+--------+--------+--------+------------+--------+  ICA Prox   72       28       Normal                          +----------+--------+--------+--------+------------+--------+  ICA Mid    79       30                                       +----------+--------+--------+--------+------------+--------+  ICA Distal 72       33                                        +----------+--------+--------+--------+------------+--------+  ECA        105      26                heterogenous           +----------+--------+--------+--------+------------+--------+ +----------+--------+-------+----------------+-------------------+             PSV cm/s EDV cms Describe         Arm Pressure (mmHG)  +----------+--------+-------+----------------+-------------------+  Subclavian 89               Multiphasic, WNL 110                  +----------+--------+-------+----------------+-------------------+ +---------+--------+--+--------+--+---------+  Vertebral PSV cm/s 47 EDV cm/s 21 Antegrade  +---------+--------+--+--------+--+---------+  Left Carotid Findings: +----------+--------+--------+--------+--------+--------+             PSV cm/s EDV cm/s Stenosis Describe Comments  +----------+--------+--------+--------+--------+--------+  CCA Prox   103      25                                   +----------+--------+--------+--------+--------+--------+  CCA Distal 68       22                                   +----------+--------+--------+--------+--------+--------+  ICA Prox   58       22       Normal                      +----------+--------+--------+--------+--------+--------+  ICA Mid    85       35                                   +----------+--------+--------+--------+--------+--------+  ICA Distal 91       37                                   +----------+--------+--------+--------+--------+--------+  ECA        86       21                                   +----------+--------+--------+--------+--------+--------+ +----------+--------+--------+----------------+-------------------+  Subclavian PSV cm/s EDV cm/s Describe         Arm Pressure (mmHG)  +----------+--------+--------+----------------+-------------------+             80                Multiphasic, WNL 110                  +----------+--------+--------+----------------+-------------------+  +---------+--------+--+--------+-+--------------+  Vertebral PSV cm/s 29 EDV cm/s 5 High resistant  +---------+--------+--+--------+-+--------------+  Final Interpretation: Right Carotid: There was no evidence of thrombus, dissection, atherosclerotic                plaque or stenosis in the cervical  carotid system. Left Carotid: There was no evidence of thrombus, dissection, atherosclerotic               plaque or stenosis in the cervical carotid system. Vertebrals:  Right vertebral artery demonstrates antegrade flow. Left vertebral              artery demonstrates high resistant flow. Subclavians: Normal flow hemodynamics were seen in bilateral subclavian              arteries. *See table(s) above for measurements and observations.  Electronically signed by Kathlyn Sacramento on 02/07/2018 at 12:29:34 PM.    Final     Assessment & Plan:   There are no diagnoses linked to this encounter.   No orders of the defined types were placed in this encounter.    Follow-up: No follow-ups on file.  Walker Kehr, MD

## 2019-03-16 NOTE — Assessment & Plan Note (Signed)
Entresto

## 2019-03-16 NOTE — Assessment & Plan Note (Signed)
We discussed age appropriate health related issues, including available/recomended screening tests and vaccinations. We discussed a need for adhering to healthy diet and exercise. Labs ordered. All questions were answered. Declined colonoscopy Opth q 24 mo Shingrix advised Cologuard (colonoscopy declined) 2017

## 2019-03-17 ENCOUNTER — Other Ambulatory Visit (INDEPENDENT_AMBULATORY_CARE_PROVIDER_SITE_OTHER): Payer: 59

## 2019-03-17 ENCOUNTER — Ambulatory Visit (INDEPENDENT_AMBULATORY_CARE_PROVIDER_SITE_OTHER)
Admission: RE | Admit: 2019-03-17 | Discharge: 2019-03-17 | Disposition: A | Discharge status: Home / Self Care | Payer: 59 | Source: Ambulatory Visit | Attending: Internal Medicine | Admitting: Internal Medicine

## 2019-03-17 DIAGNOSIS — I255 Ischemic cardiomyopathy: Secondary | ICD-10-CM | POA: Diagnosis not present

## 2019-03-17 DIAGNOSIS — Z0000 Encounter for general adult medical examination without abnormal findings: Secondary | ICD-10-CM | POA: Diagnosis not present

## 2019-03-17 DIAGNOSIS — E559 Vitamin D deficiency, unspecified: Secondary | ICD-10-CM

## 2019-03-17 LAB — URINALYSIS
Bilirubin Urine: NEGATIVE
Ketones, ur: NEGATIVE
Leukocytes,Ua: NEGATIVE
Nitrite: NEGATIVE
Specific Gravity, Urine: 1.02 (ref 1.000–1.030)
Total Protein, Urine: NEGATIVE
Urine Glucose: NEGATIVE
Urobilinogen, UA: 0.2 (ref 0.0–1.0)
pH: 5.5 (ref 5.0–8.0)

## 2019-03-17 LAB — CBC WITH DIFFERENTIAL/PLATELET
Basophils Absolute: 0.1 10*3/uL (ref 0.0–0.1)
Basophils Relative: 0.6 % (ref 0.0–3.0)
Eosinophils Absolute: 0.3 10*3/uL (ref 0.0–0.7)
Eosinophils Relative: 2.8 % (ref 0.0–5.0)
HCT: 42.7 % (ref 39.0–52.0)
Hemoglobin: 14.2 g/dL (ref 13.0–17.0)
Lymphocytes Relative: 13 % (ref 12.0–46.0)
Lymphs Abs: 1.2 10*3/uL (ref 0.7–4.0)
MCHC: 33.3 g/dL (ref 30.0–36.0)
MCV: 87.3 fl (ref 78.0–100.0)
Monocytes Absolute: 0.5 10*3/uL (ref 0.1–1.0)
Monocytes Relative: 5.4 % (ref 3.0–12.0)
Neutro Abs: 7.3 10*3/uL (ref 1.4–7.7)
Neutrophils Relative %: 78.2 % — ABNORMAL HIGH (ref 43.0–77.0)
Platelets: 222 10*3/uL (ref 150.0–400.0)
RBC: 4.89 Mil/uL (ref 4.22–5.81)
RDW: 13.7 % (ref 11.5–15.5)
WBC: 9.3 10*3/uL (ref 4.0–10.5)

## 2019-03-17 LAB — BASIC METABOLIC PANEL
BUN: 15 mg/dL (ref 6–23)
CO2: 25 mEq/L (ref 19–32)
Calcium: 9.6 mg/dL (ref 8.4–10.5)
Chloride: 105 mEq/L (ref 96–112)
Creatinine, Ser: 0.96 mg/dL (ref 0.40–1.50)
GFR: 79.73 mL/min (ref >60.00)
Glucose, Bld: 123 mg/dL — ABNORMAL HIGH (ref 70–99)
Potassium: 5.1 mEq/L (ref 3.5–5.1)
Sodium: 140 mEq/L (ref 135–145)

## 2019-03-17 LAB — HEPATIC FUNCTION PANEL
ALT: 19 U/L (ref 0–53)
AST: 22 U/L (ref 0–37)
Albumin: 4.5 g/dL (ref 3.5–5.2)
Alkaline Phosphatase: 102 U/L (ref 39–117)
Bilirubin, Direct: 0.3 mg/dL (ref 0.0–0.3)
Total Bilirubin: 1.3 mg/dL — ABNORMAL HIGH (ref 0.2–1.2)
Total Protein: 6.6 g/dL (ref 6.0–8.3)

## 2019-03-17 LAB — LIPID PANEL
Cholesterol: 123 mg/dL (ref 0–200)
HDL: 50.8 mg/dL (ref >39.00)
LDL Cholesterol: 59 mg/dL (ref 0–99)
NonHDL: 72.69
Total CHOL/HDL Ratio: 2
Triglycerides: 70 mg/dL (ref 0.0–149.0)
VLDL: 14 mg/dL (ref 0.0–40.0)

## 2019-03-17 LAB — VITAMIN D 25 HYDROXY (VIT D DEFICIENCY, FRACTURES): VITD: 44.1 ng/mL (ref 30.00–100.00)

## 2019-03-17 LAB — PSA: PSA: 0.87 ng/mL (ref 0.10–4.00)

## 2019-03-17 LAB — TSH: TSH: 0.28 u[IU]/mL — ABNORMAL LOW (ref 0.35–4.50)

## 2019-03-18 ENCOUNTER — Other Ambulatory Visit: Payer: Self-pay | Admitting: Internal Medicine

## 2019-03-18 DIAGNOSIS — E049 Nontoxic goiter, unspecified: Secondary | ICD-10-CM

## 2019-03-18 DIAGNOSIS — R7989 Other specified abnormal findings of blood chemistry: Secondary | ICD-10-CM

## 2019-03-21 ENCOUNTER — Encounter: Payer: Self-pay | Admitting: Internal Medicine

## 2019-03-25 LAB — COLOGUARD: Cologuard: NEGATIVE

## 2019-03-26 ENCOUNTER — Other Ambulatory Visit: Payer: Self-pay | Admitting: Internal Medicine

## 2019-03-26 DIAGNOSIS — E049 Nontoxic goiter, unspecified: Secondary | ICD-10-CM

## 2019-04-07 ENCOUNTER — Other Ambulatory Visit: Payer: Self-pay

## 2019-04-07 ENCOUNTER — Ambulatory Visit
Admission: RE | Admit: 2019-04-07 | Discharge: 2019-04-07 | Disposition: A | Discharge status: Home / Self Care | Payer: 59 | Source: Ambulatory Visit | Attending: Internal Medicine | Admitting: Internal Medicine

## 2019-04-07 DIAGNOSIS — E049 Nontoxic goiter, unspecified: Secondary | ICD-10-CM

## 2019-04-08 ENCOUNTER — Encounter: Payer: Self-pay | Admitting: Internal Medicine

## 2019-05-20 ENCOUNTER — Other Ambulatory Visit: Payer: Self-pay

## 2019-05-20 ENCOUNTER — Other Ambulatory Visit (INDEPENDENT_AMBULATORY_CARE_PROVIDER_SITE_OTHER): Payer: 59

## 2019-05-20 ENCOUNTER — Ambulatory Visit (INDEPENDENT_AMBULATORY_CARE_PROVIDER_SITE_OTHER): Payer: 59

## 2019-05-20 DIAGNOSIS — R7989 Other specified abnormal findings of blood chemistry: Secondary | ICD-10-CM | POA: Diagnosis not present

## 2019-05-20 DIAGNOSIS — Z23 Encounter for immunization: Secondary | ICD-10-CM | POA: Diagnosis not present

## 2019-05-20 DIAGNOSIS — Z299 Encounter for prophylactic measures, unspecified: Secondary | ICD-10-CM

## 2019-05-20 LAB — T4, FREE: Free T4: 0.93 ng/dL (ref 0.60–1.60)

## 2019-05-20 LAB — TSH: TSH: 0.29 u[IU]/mL — ABNORMAL LOW (ref 0.35–4.50)

## 2019-05-20 NOTE — Progress Notes (Signed)
sh

## 2019-06-22 ENCOUNTER — Encounter: Payer: Self-pay | Admitting: Cardiology

## 2019-06-22 ENCOUNTER — Other Ambulatory Visit: Payer: Self-pay

## 2019-06-22 ENCOUNTER — Ambulatory Visit: Payer: 59 | Admitting: Cardiology

## 2019-06-22 VITALS — BP 118/62 | HR 62 | Temp 98.2°F | Ht 67.0 in | Wt 166.6 lb

## 2019-06-22 DIAGNOSIS — I952 Hypotension due to drugs: Secondary | ICD-10-CM | POA: Diagnosis not present

## 2019-06-22 DIAGNOSIS — Z955 Presence of coronary angioplasty implant and graft: Secondary | ICD-10-CM

## 2019-06-22 DIAGNOSIS — I5042 Chronic combined systolic (congestive) and diastolic (congestive) heart failure: Secondary | ICD-10-CM | POA: Diagnosis not present

## 2019-06-22 DIAGNOSIS — I255 Ischemic cardiomyopathy: Secondary | ICD-10-CM | POA: Diagnosis not present

## 2019-06-22 DIAGNOSIS — I2510 Atherosclerotic heart disease of native coronary artery without angina pectoris: Secondary | ICD-10-CM

## 2019-06-22 DIAGNOSIS — E785 Hyperlipidemia, unspecified: Secondary | ICD-10-CM

## 2019-06-22 MED ORDER — ENTRESTO 49-51 MG PO TABS: 1.0000 | ORAL_TABLET | Freq: Two times a day (BID) | ORAL | 3 refills | Status: DC

## 2019-06-22 MED ORDER — CARVEDILOL 3.125 MG PO TABS: 3.1250 mg | ORAL_TABLET | Freq: Two times a day (BID) | ORAL | 3 refills | Status: DC

## 2019-06-22 MED ORDER — BRILINTA 60 MG PO TABS: ORAL_TABLET | ORAL | 3 refills | Status: DC

## 2019-06-22 NOTE — Patient Instructions (Addendum)
Medication Instructions:  NO CHANGES     Lab work:  NOT NEEDED  Testing/Procedures: NOT NEEDED  Follow-Up: At [facility], you and your health needs are our priority.  As part of our continuing mission to provide you with exceptional heart care, we have created designated Provider Care Teams.  These Care Teams include your primary Cardiologist (physician) and Advanced Practice Providers (APPs -  Physician Assistants and Nurse Practitioners) who all work together to provide you with the care you need, when you need it. . You will need a follow up appointment in 12 months .  Please call our office 2 months in advance to schedule this appointment.  You may see Glenetta Hew, MD or one of the following Advanced Practice Providers on your designated Care Team:   . Rosaria Ferries, PA-C . Jory Sims, DNP, ANP  Any Other Special Instructions Will Be Listed Below (If Applicable). KEEP COPY OF EKG ON YOU AT ALL TIMES

## 2019-06-22 NOTE — Progress Notes (Signed)
PCP: Plotnikov, Evie Lacks, MD  Clinic Note: Chief Complaint  Patient presents with  . Follow-up    6 months.  Stable.  No symptoms  . Coronary Artery Disease    No angina  . Cardiomyopathy    No CHF symptoms    HPI: Blake [name] is a 63 y.o. male with a PMH notable for CAD-ICM resulting from large Anterior STEMI who presents for 6 month f/u.   He has been referred to EP for ICD evaluation. Indicated that he wanted to think it over (due to see back in Oct.  Still not ready for ICD.   Blake Christenbury was last seen in March 2020 --> doing well. Not exercising as much as usual - walks at least once weekly, but quite active at work.  No angina or heart failure symptoms. -> Plan was to reduce Brilinta dose to 60 mg BID  Recent Hospitalizations: None   Studies Personally Reviewed - (if available, images/films reviewed: From Epic Chart or Care Everywhere)  none  Interval History: Mr. Schlotterbeck returns today feeling well.  He is able to do his routine activity.  He is now jogging more frequently.  He is also walking a lot both at work and at home.  He is working part-time at home and part-time at work depending on what needs to be done.  He has taken advantage of some of his time at home to do more exercise.   He continues to do well feeling no chest pain or pressure with rest or exertion.  No heart failure symptoms of PND, orthopnea or edema. He did finally reduce to the 60 mg twice daily Brilinta and is not having any bleeding issues.  Remainder of cardiovascular review of symptoms: no chest pain or dyspnea on exertion negative for - edema, irregular heartbeat, orthopnea, palpitations, paroxysmal nocturnal dyspnea, rapid heart rate, shortness of breath or Syncope/near syncope, TIA/amaurosis fugax, claudication.  ROS: A comprehensive was performed. Review of Systems  Constitutional: Negative for chills, fever, malaise/fatigue and weight loss (Still watches his diet and exercises).  HENT:  Negative for congestion and nosebleeds.   Respiratory: Negative for cough, shortness of breath and wheezing.   Cardiovascular: Negative for chest pain, palpitations and leg swelling.  Gastrointestinal: Negative for abdominal pain, blood in stool, heartburn and melena.  Genitourinary: Negative for dysuria and hematuria.  Musculoskeletal: Negative for joint pain and myalgias.  Neurological: Negative for dizziness and weakness. Focal weakness:    Psychiatric/Behavioral: Negative for depression and memory loss. The patient is not nervous/anxious.   All other systems reviewed and are negative.  I have reviewed and (if needed) personally updated the patient's problem list, medications, allergies, past medical and surgical history, social and family history.   Past Medical History:  Diagnosis Date  . CAD (coronary artery disease), native coronary artery 01/2017   a. 01/2017: anterior STEMI with 100% occlusion of the prox-mid LAD with 95% distal LAD stenosis due to distal embolization of thrombus. A Synergy DES was placed to the prox-LAD.  Marland Kitchen Cancer [facility]) 2009   brain tumor  . Hyperlipemia   . Ischemic cardiomyopathy    a. 01/2017: initial echo showed a reduced EF of 30-35% with repeat echo of 40-45%. -->  However follow-up echo showed EF of 30-35%, EF confirmed to be 30% by MRI October 2018.  Marland Kitchen LUMBAR RADICULOPATHY, RIGHT 07/02/2008   Qualifier: Diagnosis of  By: Alain Marion MD, Evie Lacks Memory loss 07/05/2008   Qualifier: Diagnosis of  ByAlain Marion MD, [place] CONFUSION 07/05/2008   Qualifier: Diagnosis of  By: Alain Marion MD, Evie Lacks Neoplasm of unspecified nature of brain 08/18/2008   Qualifier: Diagnosis of  By: Alain Marion MD, Evie Lacks Rash and other nonspecific skin eruption 08/18/2008   Qualifier: Diagnosis of  By: Doralee Albino    . ST elevation myocardial infarction (STEMI) of anterior wall ([place]) 01/2017   Large anterior infarct.  . TOBACCO USER 11/28/2009    Qualifier: Diagnosis of  By: Alain Marion MD, Evie Lacks   . VITAMIN D DEFICIENCY 07/02/2008   Qualifier: Diagnosis of  By: Plotnikov MD, Evie Lacks     Past Surgical History:  Procedure Laterality Date  . CORONARY STENT INTERVENTION N/A 02/23/2017   Procedure: Coronary Stent Intervention;  Surgeon: Leonie Man, MD;  Location: [facility] INVASIVE CV LAB;; p-mLAD 100% (thrombotic) --> PCI- SYNERGY DES 3 X 24 -- 3.1 mm; distal embolization to apical LAD.  Marland Kitchen CORONARY/GRAFT ACUTE MI REVASCULARIZATION N/A 02/23/2017   Procedure: Coronary/Graft Acute MI Revascularization;  Surgeon: Leonie Man, MD;  Location: [place] CV LAB;  Service: Cardiovascular;;; PCI of 100% thrombotic p-mLAD in setting of Anterior STEMI - delayed presentation  . CRANIECTOMY / [place] TUMOR  2009   surgery and XRT at [facility]  . LEFT HEART CATH AND CORONARY ANGIOGRAPHY N/A 02/23/2017   Procedure: Left Heart Cath and Coronary Angiography;  Surgeon: Leonie Man, MD;  Location: [place] CV LAB;;; Delayed presentation of Anterior STEMI -- p-m LAD 100% thrombotic occlusion, 30% AVG Cx. EF ~35% (anterior-anteroapical severe hypokinesis  . TRANSTHORACIC ECHOCARDIOGRAM  11/2017   Anterior , septal, apical and inferior apical hypokinesis. The cavity size was moderately dilated. Wall  thickness was normal. Systolic function was severely reduced. Mild AoV calcification    2 D Echo 12/07/2017:  25-30%. Severely reduced function. Anterior , septal, apical and inferior apical hypokinesis. The cavity size was moderately dilated. Wall thickness was normal. Systolic function was severely reduced. Mild AoV calcification   Cath-PCI 01/2017: p-mLAD 100% (thrombotic) --> PCI- SYNERGY DES 3 X 24 -- 3.1 mm; distal embolization to apical LAD.  EF ~35% anterior HK.     Current Meds  Medication Sig  . BRILINTA 60 MG TABS tablet TAKE 1 TABLET (60 MG TOTAL) BY MOUTH 2 (TWO) TIMES DAILY.  . carvedilol (COREG) 3.125 MG tablet  Take 1 tablet (3.125 mg total) by mouth 2 (two) times daily with a meal.  . Cholecalciferol (VITAMIN D3) 2000 units capsule Take 1 capsule (2,000 Units total) by mouth daily.  . nitroGLYCERIN (NITROSTAT) 0.4 MG SL tablet Place 1 tablet (0.4 mg total) under the tongue every 5 (five) minutes as needed.  . sacubitril-valsartan (ENTRESTO) 49-51 MG Take 1 tablet by mouth 2 (two) times daily.  . [DISCONTINUED] atorvastatin (LIPITOR) 40 MG tablet TAKE 1 TABLET BY MOUTH EVERY DAY  . [DISCONTINUED] BRILINTA 60 MG TABS tablet TAKE 1 TABLET (60 MG TOTAL) BY MOUTH 2 (TWO) TIMES DAILY.  . [DISCONTINUED] carvedilol (COREG) 3.125 MG tablet TAKE 1 TABLET (3.125 MG TOTAL) BY MOUTH 2 (TWO) TIMES DAILY WITH A MEAL.  . [DISCONTINUED] ENTRESTO 49-51 MG TAKE 1 TABLET BY MOUTH TWICE A DAY  -- Atorvastatin 40 mg PO daily still active  No Known Allergies  Social History   Tobacco Use  . Smoking status: Former Smoker    Packs/day: 0.07    Types: Cigarettes    Quit  date: 03/31/2017    Years since quitting: 2.2  . Smokeless tobacco: Never Used  Substance Use Topics  . Alcohol use: No  . Drug use: No   Social History   Social History Narrative   Very active. 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 He also walks daily after work for at least 30-45 minutes. On weekends he tries to walk at least an hour. This is usually a fast walk or slow jog.      He quit smoking shortly after his MI.    family history includes Alzheimer's disease in his father; Arthritis in his mother; Goiter in his mother; Hypertension in an other family member; Parkinsonism in his father.  Wt Readings from Last 3 Encounters:  06/22/19 166 lb 9.6 oz (75.6 kg)  03/16/19 167 lb (75.8 kg)  12/08/18 171 lb 6.4 oz (77.7 kg)    PHYSICAL EXAM BP 118/62   Pulse 62   Temp 98.2 F (36.8 C)   Ht 5\' 7"  (1.702 m)   Wt 166 lb 9.6 oz (75.6 kg)   SpO2 98%   BMI 26.09 kg/m  Physical Exam  Constitutional: He is oriented to person, place, and  time. He appears well-developed and well-nourished. No distress.  Very healthy-appearing.  Well-groomed.  HENT:  Head: Normocephalic and atraumatic.  Mouth/Throat: No oropharyngeal exudate.  Neck: Normal range of motion. Neck supple. No hepatojugular reflux and no JVD present. Carotid bruit is not present. No thyromegaly present.  Cardiovascular: Normal rate, regular rhythm and intact distal pulses.  No extrasystoles are present. PMI is not displaced. Exam reveals no gallop and no friction rub.  No murmur (Cannot exclude soft 1/6 SEM at RUSB.) heard. Pulmonary/Chest: Effort normal and breath sounds normal. No respiratory distress. He has no wheezes. He has no rales.  Abdominal: Soft. Bowel sounds are normal. He exhibits no distension. There is no abdominal tenderness.  Musculoskeletal: Normal range of motion.        General: No edema.  Neurological: He is alert and oriented to person, place, and time.  Psychiatric: He has a normal mood and affect. His behavior is normal. Judgment and thought content normal.  He has a somewhat intense albeit very pleasant affect.  He is very Chief Financial Officer like  Vitals reviewed.    Adult ECG Report Normal sinus rhythm, rate 60 bpm.  Incomplete RBBB.  Lateral T wave inversions with some mild ST-T wave elevations in precordial leads (still has appearance of baseline ST elevation).  No change from last EKG (copy given to patient)  Other studies Reviewed: Additional studies/ records that were reviewed today include:  Recent Labs:  Due for labs in April with PCP.  Lab Results  Component Value Date   CHOL 123 03/17/2019   HDL 50.80 03/17/2019   LDLCALC 59 03/17/2019   LDLDIRECT 161.0 05/14/2012   TRIG 70.0 03/17/2019   CHOLHDL 2 03/17/2019   Lab Results  Component Value Date   CREATININE 0.96 03/17/2019   BUN 15 03/17/2019   NA 140 03/17/2019   K 5.1 03/17/2019   CL 105 03/17/2019   CO2 25 03/17/2019   Lab Results  Component Value Date   HGBA1C 5.7 (H)  02/23/2017    ASSESSMENT / PLAN: Problem List Items Addressed This Visit    Ischemic cardiomyopathy - Primary (Chronic)    On carvedilol and Entresto on stable doses.  Euvolemic on exam.  Again he does not seem to be ready to go forward with considering ICD  despite discussion of risk versus benefit.      Relevant Medications   carvedilol (COREG) 3.125 MG tablet   sacubitril-valsartan (ENTRESTO) 49-51 MG   Other Relevant Orders   EKG 12-Lead (Completed)   Chronic combined systolic and diastolic heart failure, NYHA class 1 (HCC) (Chronic)    Despite having reduced EF on echo, he clearly has class I CHF symptoms.  Euvolemic.  Not requiring diuretic.  He is on very low-dose carvedilol and modest dose Entresto. He is exercising with walking jogging etc.  Not able to tolerate any titration of medications.  No changes.      Relevant Medications   carvedilol (COREG) 3.125 MG tablet   sacubitril-valsartan (ENTRESTO) 49-51 MG   Other Relevant Orders   EKG 12-Lead (Completed)   Coronary artery disease involving native coronary artery of native heart without angina pectoris (Chronic)    Unfortunately, he had a relatively large anterior STEMI that was probably a late presentation.  He had major occlusion of the LAD proximally with distal embolization.  Took a very large hit and has significant anterior wall scar on cardiac MRI.  Despite this he does not seem to have any further symptoms of angina or heart failure from significant reduced EF.  Very active.  In the absence of symptoms, would not further investigate.  Plan: Continue carvedilol Entresto atorvastatin. He is now on maintenance dose Brilinta -> no bleeding issues.  We will continue for now as he probably would not tolerate reocclusion of the LAD.      Relevant Medications   carvedilol (COREG) 3.125 MG tablet   sacubitril-valsartan (ENTRESTO) 49-51 MG   Other Relevant Orders   EKG 12-Lead (Completed)   Presence of drug  coated stent in LAD coronary artery (Chronic)    DES PCI of proximal to mid LAD with some distal embolization to the apex.  Currently on maintenance dose Brilinta without aspirin.  Okay to hold Brilinta 5 to 7 days prior to any procedure.  Does not need reassessment in order to make this decision.      Hyperlipidemia LDL goal <70 (Chronic)    Labs from June show well-controlled lipids.  LDL is 59.  This is much improved from 2013 when his direct LDL was 161. He is on stable dose of atorvastatin.  We will simply refill for him today.  Labs are being followed by PCP.      Relevant Medications   carvedilol (COREG) 3.125 MG tablet   sacubitril-valsartan (ENTRESTO) 49-51 MG   Hypotension due to drugs    Not able to titrate either Entresto or carvedilol any further.      Relevant Medications   carvedilol (COREG) 3.125 MG tablet   sacubitril-valsartan (ENTRESTO) 49-51 MG   Other Relevant Orders   EKG 12-Lead (Completed)      Current medicines are reviewed at length with the patient today. (+/- concerns) none The following changes have been made:  Patient Instructions  Medication Instructions:  NO CHANGES     Lab work:  NOT NEEDED  Testing/Procedures: NOT NEEDED  Follow-Up: At [facility], you and your health needs are our priority.  As part of our continuing mission to provide you with exceptional heart care, we have created designated Provider Care Teams.  These Care Teams include your primary Cardiologist (physician) and Advanced Practice Providers (APPs -  Physician Assistants and Nurse Practitioners) who all work together to provide you with the care you need, when you need it. . You will need a  follow up appointment in 12 months .  Please call our office 2 months in advance to schedule this appointment.  You may see Glenetta Hew, MD or one of the following Advanced Practice Providers on your designated Care Team:   . Rosaria Ferries, PA-C . Jory Sims, DNP,  ANP  Any Other Special Instructions Will Be Listed Below (If Applicable). KEEP COPY OF EKG ON YOU AT ALL TIMES   Studies Ordered:   Orders Placed This Encounter  Procedures  . EKG 12-Lead      Glenetta Hew, M.D., M.S. Interventional Cardiologist   Pager # [phone] Phone # [phone] [address]. [place], [place] [address]   Thank you for choosing Heartcare at [facility]!!

## 2019-06-23 ENCOUNTER — Other Ambulatory Visit: Payer: Self-pay | Admitting: Cardiology

## 2019-06-25 ENCOUNTER — Encounter: Payer: Self-pay | Admitting: Cardiology

## 2019-06-25 NOTE — Assessment & Plan Note (Signed)
Not able to titrate either Entresto or carvedilol any further.

## 2019-06-25 NOTE — Assessment & Plan Note (Signed)
Labs from June show well-controlled lipids.  LDL is 59.  This is much improved from 2013 when his direct LDL was 161. He is on stable dose of atorvastatin.  We will simply refill for him today.  Labs are being followed by PCP.

## 2019-06-25 NOTE — Assessment & Plan Note (Signed)
On carvedilol and Entresto on stable doses.  Euvolemic on exam.  Again he does not seem to be ready to go forward with considering ICD despite discussion of risk versus benefit.

## 2019-06-25 NOTE — Assessment & Plan Note (Signed)
Unfortunately, he had a relatively large anterior STEMI that was probably a late presentation.  He had major occlusion of the LAD proximally with distal embolization.  Took a very large hit and has significant anterior wall scar on cardiac MRI.  Despite this he does not seem to have any further symptoms of angina or heart failure from significant reduced EF.  Very active.  In the absence of symptoms, would not further investigate.  Plan: Continue carvedilol Entresto atorvastatin. He is now on maintenance dose Brilinta -> no bleeding issues.  We will continue for now as he probably would not tolerate reocclusion of the LAD.

## 2019-06-25 NOTE — Assessment & Plan Note (Signed)
DES PCI of proximal to mid LAD with some distal embolization to the apex.  Currently on maintenance dose Brilinta without aspirin.  Okay to hold Brilinta 5 to 7 days prior to any procedure.  Does not need reassessment in order to make this decision.

## 2019-06-25 NOTE — Assessment & Plan Note (Signed)
Despite having reduced EF on echo, he clearly has class I CHF symptoms.  Euvolemic.  Not requiring diuretic.  He is on very low-dose carvedilol and modest dose Entresto. He is exercising with walking jogging etc.  Not able to tolerate any titration of medications.  No changes.

## 2019-09-15 ENCOUNTER — Ambulatory Visit: Payer: 59

## 2020-01-04 ENCOUNTER — Other Ambulatory Visit: Payer: Self-pay | Admitting: Cardiology

## 2020-01-05 NOTE — Telephone Encounter (Signed)
Rx request sent to pharmacy.  

## 2020-01-27 ENCOUNTER — Ambulatory Visit (INDEPENDENT_AMBULATORY_CARE_PROVIDER_SITE_OTHER): Payer: 59 | Admitting: Internal Medicine

## 2020-01-27 ENCOUNTER — Encounter: Payer: Self-pay | Admitting: Internal Medicine

## 2020-01-27 ENCOUNTER — Other Ambulatory Visit: Payer: Self-pay

## 2020-01-27 VITALS — BP 116/70 | HR 54 | Temp 98.6°F | Ht 67.0 in | Wt 165.0 lb

## 2020-01-27 DIAGNOSIS — Z0000 Encounter for general adult medical examination without abnormal findings: Secondary | ICD-10-CM

## 2020-01-27 DIAGNOSIS — E559 Vitamin D deficiency, unspecified: Secondary | ICD-10-CM | POA: Diagnosis not present

## 2020-01-27 DIAGNOSIS — J310 Chronic rhinitis: Secondary | ICD-10-CM

## 2020-01-27 LAB — BASIC METABOLIC PANEL
BUN: 21 mg/dL (ref 6–23)
CO2: 28 mEq/L (ref 19–32)
Calcium: 9.3 mg/dL (ref 8.4–10.5)
Chloride: 107 mEq/L (ref 96–112)
Creatinine, Ser: 0.98 mg/dL (ref 0.40–1.50)
GFR: 77.63 mL/min (ref >60.00)
Glucose, Bld: 120 mg/dL — ABNORMAL HIGH (ref 70–99)
Potassium: 5.5 mEq/L — ABNORMAL HIGH (ref 3.5–5.1)
Sodium: 141 mEq/L (ref 135–145)

## 2020-01-27 LAB — URINALYSIS
Bilirubin Urine: NEGATIVE
Hgb urine dipstick: NEGATIVE
Ketones, ur: NEGATIVE
Leukocytes,Ua: NEGATIVE
Nitrite: NEGATIVE
Specific Gravity, Urine: 1.02 (ref 1.000–1.030)
Total Protein, Urine: NEGATIVE
Urine Glucose: NEGATIVE
Urobilinogen, UA: 0.2 (ref 0.0–1.0)
pH: 5.5 (ref 5.0–8.0)

## 2020-01-27 LAB — TSH: TSH: 0.61 u[IU]/mL (ref 0.35–4.50)

## 2020-01-27 LAB — HEPATIC FUNCTION PANEL
ALT: 19 U/L (ref 0–53)
AST: 25 U/L (ref 0–37)
Albumin: 4.4 g/dL (ref 3.5–5.2)
Alkaline Phosphatase: 108 U/L (ref 39–117)
Bilirubin, Direct: 0.2 mg/dL (ref 0.0–0.3)
Total Bilirubin: 0.9 mg/dL (ref 0.2–1.2)
Total Protein: 6.8 g/dL (ref 6.0–8.3)

## 2020-01-27 LAB — LIPID PANEL
Cholesterol: 130 mg/dL (ref 0–200)
HDL: 45 mg/dL (ref >39.00)
LDL Cholesterol: 70 mg/dL (ref 0–99)
NonHDL: 84.63
Total CHOL/HDL Ratio: 3
Triglycerides: 72 mg/dL (ref 0.0–149.0)
VLDL: 14.4 mg/dL (ref 0.0–40.0)

## 2020-01-27 LAB — PSA: PSA: 0.97 ng/mL (ref 0.10–4.00)

## 2020-01-27 MED ORDER — LORATADINE 10 MG PO TABS: 10.0000 mg | ORAL_TABLET | Freq: Every day | ORAL | 11 refills | Status: DC

## 2020-01-27 MED ORDER — IPRATROPIUM BROMIDE 0.06 % NA SOLN: 2.0000 | Freq: Three times a day (TID) | NASAL | 2 refills | Status: DC

## 2020-01-27 MED ORDER — CEFDINIR 300 MG PO CAPS: 300.0000 mg | ORAL_CAPSULE | Freq: Two times a day (BID) | ORAL | 0 refills | Status: DC

## 2020-01-27 NOTE — Assessment & Plan Note (Addendum)
  We discussed age appropriate health related issues, including available/recomended screening tests and vaccinations. Labs were ordered to be later reviewed . All questions were answered. We discussed one or more of the following - seat belt use, use of sunscreen/sun exposure exercise, safe sex, fall risk reduction, second hand smoke exposure, firearm use and storage, seat belt use, a need for adhering to healthy diet and exercise. Labs were ordered or discussed if they are available. All questions were answered.  Declined colonoscopy Opth q 24 mo Shingrix advised Cologuard (colonoscopy declined) 2017, 2020

## 2020-01-27 NOTE — Assessment & Plan Note (Signed)
Vit D 

## 2020-01-27 NOTE — Progress Notes (Signed)
Subjective:  Patient ID: Blake [name], male    DOB: [birth year]  Age: 64 y.o. MRN: [id number]  CC: No chief complaint on file.   HPI Blake [name] presents for a well exam C/o sinus drainage when running, eating  Outpatient Medications Prior to Visit  Medication Sig Dispense Refill  . atorvastatin (LIPITOR) 40 MG tablet TAKE 1 TABLET BY MOUTH EVERY DAY 90 tablet 3  . BRILINTA 60 MG TABS tablet TAKE 1 TABLET (60 MG TOTAL) BY MOUTH 2 (TWO) TIMES DAILY. 180 tablet 1  . carvedilol (COREG) 3.125 MG tablet Take 1 tablet (3.125 mg total) by mouth 2 (two) times daily with a meal. 180 tablet 3  . Cholecalciferol (VITAMIN D3) 2000 units capsule Take 1 capsule (2,000 Units total) by mouth daily. 100 capsule 3  . nitroGLYCERIN (NITROSTAT) 0.4 MG SL tablet Place 1 tablet (0.4 mg total) under the tongue every 5 (five) minutes as needed. 25 tablet 3  . sacubitril-valsartan (ENTRESTO) 49-51 MG Take 1 tablet by mouth 2 (two) times daily. 180 tablet 3   No facility-administered medications prior to visit.    ROS: Review of Systems  Constitutional: Negative for appetite change, fatigue and unexpected weight change.  HENT: Positive for congestion, postnasal drip and rhinorrhea. Negative for nosebleeds, sneezing, sore throat and trouble swallowing.   Eyes: Negative for itching and visual disturbance.  Respiratory: Negative for cough.   Cardiovascular: Negative for chest pain, palpitations and leg swelling.  Gastrointestinal: Negative for abdominal distention, blood in stool, diarrhea and nausea.  Genitourinary: Negative for frequency and hematuria.  Musculoskeletal: Negative for back pain, gait problem, joint swelling and neck pain.  Skin: Negative for rash.  Neurological: Negative for dizziness, tremors, speech difficulty and weakness.  Psychiatric/Behavioral: Negative for agitation, dysphoric mood, sleep disturbance and suicidal ideas. The patient is not nervous/anxious.       Objective:  BP  116/70 (BP Location: Left Arm, Patient Position: Sitting, Cuff Size: Normal)   Pulse (!) 54   Temp 98.6 F (37 C) (Oral)   Ht 5\' 7"  (1.702 m)   Wt 165 lb (74.8 kg)   SpO2 98%   BMI 25.84 kg/m   BP Readings from Last 3 Encounters:  01/27/20 116/70  06/22/19 118/62  03/16/19 124/78    Wt Readings from Last 3 Encounters:  01/27/20 165 lb (74.8 kg)  06/22/19 166 lb 9.6 oz (75.6 kg)  03/16/19 167 lb (75.8 kg)    Physical Exam Constitutional:      General: He is not in acute distress.    Appearance: He is well-developed.     Comments: NAD  Eyes:     Conjunctiva/sclera: Conjunctivae normal.     Pupils: Pupils are equal, round, and reactive to light.  Neck:     Thyroid: No thyromegaly.     Vascular: No JVD.  Cardiovascular:     Rate and Rhythm: Normal rate and regular rhythm.     Heart sounds: Normal heart sounds. No murmur. No friction rub. No gallop.   Pulmonary:     Effort: Pulmonary effort is normal. No respiratory distress.     Breath sounds: Normal breath sounds. No wheezing or rales.  Chest:     Chest wall: No tenderness.  Abdominal:     General: Bowel sounds are normal. There is no distension.     Palpations: Abdomen is soft. There is no mass.     Tenderness: There is no abdominal tenderness. There is no guarding or rebound.  Musculoskeletal:  General: No tenderness. Normal range of motion.     Cervical back: Normal range of motion.  Lymphadenopathy:     Cervical: No cervical adenopathy.  Skin:    General: Skin is warm and dry.     Findings: No rash.  Neurological:     Mental Status: He is alert and oriented to person, place, and time.     Cranial Nerves: No cranial nerve deficit.     Motor: No abnormal muscle tone.     Coordination: Coordination normal.     Gait: Gait normal.     Deep Tendon Reflexes: Reflexes are normal and symmetric.  Psychiatric:        Behavior: Behavior normal.        Thought Content: Thought content normal.        Judgment:  Judgment normal.     Lab Results  Component Value Date   WBC 9.3 03/17/2019   HGB 14.2 03/17/2019   HCT 42.7 03/17/2019   PLT 222.0 03/17/2019   GLUCOSE 123 (H) 03/17/2019   CHOL 123 03/17/2019   TRIG 70.0 03/17/2019   HDL 50.80 03/17/2019   LDLDIRECT 161.0 05/14/2012   LDLCALC 59 03/17/2019   ALT 19 03/17/2019   AST 22 03/17/2019   NA 140 03/17/2019   K 5.1 03/17/2019   CL 105 03/17/2019   CREATININE 0.96 03/17/2019   BUN 15 03/17/2019   CO2 25 03/17/2019   TSH 0.29 (L) 05/20/2019   PSA 0.87 03/17/2019   INR 0.9 07/07/2008   HGBA1C 5.7 (H) 02/23/2017    US THYROID  Result Date: 04/07/2019 CLINICAL DATA:  64 year old male with a history of thyroid goiter EXAM: THYROID ULTRASOUND TECHNIQUE: Ultrasound examination of the thyroid gland and adjacent soft tissues was performed. COMPARISON:  01/01/2017, 12/30/2015 FINDINGS: Parenchymal Echotexture: Mildly heterogenous Isthmus: 0.5 cm Right lobe: 6.0 cm x 2.4 cm x 2.6 cm Left lobe: 6.3 cm x 2.4 cm x 2.3 cm _________________________________________________________ Estimated total number of nodules >/= 1 cm: 4 Number of spongiform nodules >/=  2 cm not described below (TR1): 0 Number of mixed cystic and solid nodules >/= 1.5 cm not described below (TR2): 0 _________________________________________________________ Nodule labeled 1 in the right thyroid decreased in size with decompression of cystic component, now measuring 9 mm, previously 1.4 cm. Does not meet criteria for surveillance or biopsy. Nodule 2 remains spongiform and does not meet criteria for surveillance or biopsy Nodule 3 on the left continues to decrease in size with decompression of the cystic component, currently 1.0 cm, previously 1.4 cm. Continued surveillance may be reasonable. Nodule labeled 4 on the left appears spongiform and does not meet criteria for surveillance or biopsy. Nodule labeled 5 on the left with cystic components does not meet criteria for surveillance or  biopsy Nodule 6 on the left measures 0.96 cm with spongiform characteristics and does not meet criteria for surveillance or biopsy. No adenopathy. IMPRESSION: Left superior thyroid nodule open (labeled 3) continues to decrease in size, which is most compatible with benign nodule. Continued surveillance may be reasonable up to 5 years. No other thyroid nodule meets criteria for biopsy or surveillance, as designated by the newly established ACR TI-RADS criteria. Recommendations follow those established by the new ACR TI-RADS criteria (J Am Coll Radiol N8838707). Electronically Signed   By: Corrie Mckusick D.O.   On: 04/07/2019 14:50    Assessment & Plan:    Walker Kehr, MD

## 2020-01-27 NOTE — Addendum Note (Signed)
Addended by: Cresenciano Lick on: 01/27/2020 10:35 AM   Modules accepted: Orders

## 2020-01-27 NOTE — Assessment & Plan Note (Addendum)
C/o sinus drainage when running, eating Claritin Atrovent Po ABX if not better in 2 wks Rx printed

## 2020-01-28 LAB — CBC WITH DIFFERENTIAL/PLATELET
Basophils Absolute: 0 10*3/uL (ref 0.0–0.1)
Basophils Relative: 0.5 % (ref 0.0–3.0)
Eosinophils Absolute: 0.3 10*3/uL (ref 0.0–0.7)
Eosinophils Relative: 4.3 % (ref 0.0–5.0)
HCT: 41.3 % (ref 39.0–52.0)
Hemoglobin: 13.8 g/dL (ref 13.0–17.0)
Lymphocytes Relative: 20.6 % (ref 12.0–46.0)
Lymphs Abs: 1.4 10*3/uL (ref 0.7–4.0)
MCHC: 33.3 g/dL (ref 30.0–36.0)
MCV: 88.7 fl (ref 78.0–100.0)
Monocytes Absolute: 0.3 10*3/uL (ref 0.1–1.0)
Monocytes Relative: 4.4 % (ref 3.0–12.0)
Neutro Abs: 4.8 10*3/uL (ref 1.4–7.7)
Neutrophils Relative %: 70.2 % (ref 43.0–77.0)
Platelets: 246 10*3/uL (ref 150.0–400.0)
RBC: 4.66 Mil/uL (ref 4.22–5.81)
RDW: 14 % (ref 11.5–15.5)
WBC: 6.9 10*3/uL (ref 4.0–10.5)

## 2020-02-01 ENCOUNTER — Other Ambulatory Visit: Payer: Self-pay | Admitting: Internal Medicine

## 2020-02-01 DIAGNOSIS — R739 Hyperglycemia, unspecified: Secondary | ICD-10-CM

## 2020-02-19 ENCOUNTER — Other Ambulatory Visit (INDEPENDENT_AMBULATORY_CARE_PROVIDER_SITE_OTHER): Payer: 59

## 2020-02-19 DIAGNOSIS — R739 Hyperglycemia, unspecified: Secondary | ICD-10-CM | POA: Diagnosis not present

## 2020-02-19 LAB — BASIC METABOLIC PANEL
BUN: 18 mg/dL (ref 6–23)
CO2: 26 mEq/L (ref 19–32)
Calcium: 9.2 mg/dL (ref 8.4–10.5)
Chloride: 106 mEq/L (ref 96–112)
Creatinine, Ser: 0.95 mg/dL (ref 0.40–1.50)
GFR: 80.45 mL/min (ref >60.00)
Glucose, Bld: 106 mg/dL — ABNORMAL HIGH (ref 70–99)
Potassium: 4.8 mEq/L (ref 3.5–5.1)
Sodium: 138 mEq/L (ref 135–145)

## 2020-02-19 LAB — HEMOGLOBIN A1C: Hgb A1c MFr Bld: 6 % (ref 4.6–6.5)

## 2020-05-09 ENCOUNTER — Ambulatory Visit: Payer: 59 | Admitting: Cardiology

## 2020-05-09 ENCOUNTER — Other Ambulatory Visit: Payer: Self-pay

## 2020-05-09 VITALS — BP 102/68 | HR 64 | Temp 97.5°F | Resp 16 | Wt 158.4 lb

## 2020-05-09 DIAGNOSIS — I213 ST elevation (STEMI) myocardial infarction of unspecified site: Secondary | ICD-10-CM

## 2020-05-09 DIAGNOSIS — I5042 Chronic combined systolic (congestive) and diastolic (congestive) heart failure: Secondary | ICD-10-CM | POA: Diagnosis not present

## 2020-05-09 DIAGNOSIS — E785 Hyperlipidemia, unspecified: Secondary | ICD-10-CM | POA: Diagnosis not present

## 2020-05-09 DIAGNOSIS — I2510 Atherosclerotic heart disease of native coronary artery without angina pectoris: Secondary | ICD-10-CM | POA: Diagnosis not present

## 2020-05-09 DIAGNOSIS — I952 Hypotension due to drugs: Secondary | ICD-10-CM

## 2020-05-09 DIAGNOSIS — Z955 Presence of coronary angioplasty implant and graft: Secondary | ICD-10-CM | POA: Diagnosis not present

## 2020-05-09 NOTE — Patient Instructions (Signed)
Medication Instructions:  No changes *If you need a refill on your cardiac medications before your next appointment, please call your pharmacy*  Lab Work: Not needed If you have labs (blood work) drawn today and your tests are completely normal, you will receive your results only by: . MyChart Message (if you have MyChart) OR . A paper copy in the mail If you have any lab test that is abnormal or we need to change your treatment, we will call you to review the results.  Testing/Procedures: Not needed  Follow-Up: At [facility], you and your health needs are our priority.  As part of our continuing mission to provide you with exceptional heart care, we have created designated Provider Care Teams.  These Care Teams include your primary Cardiologist (physician) and Advanced Practice Providers (APPs -  Physician Assistants and Nurse Practitioners) who all work together to provide you with the care you need, when you need it.  Your next appointment:   12 month(s)  The format for your next appointment:   In Person  Provider:   David Harding, MD  Other Instructions  

## 2020-05-09 NOTE — Progress Notes (Signed)
Primary Care Provider: Cassandria Anger, MD Cardiologist: Glenetta Hew, MD Electrophysiologist: Will Meredith Leeds, MD  Clinic Note: Chief Complaint  Patient presents with  . Follow-up    Annual  . Coronary Artery Disease    No angina  . Cardiomyopathy    No CHF symptoms; still not interested in ICD.    HPI:    Blake [name] is a 64 y.o. male with a PMH notable for history of large anterior STEMI and PCI to LAD) with resultant ischemic cardiomyopathy most recent echo shows EF 25-30% (large anterior scar on cardiac MRI) who presents today for annual follow-up of CAD.  Blake [name] was last seen in September 2020.  He was doing fairly well with no major complaints.  Blake [name] on exam.  Indicated that he was not ready to go forward with ICD.  Continued on carvedilol 3.125 mg twice daily and Entresto 49-51 mg twice daily -> unable to titrate further because of low blood pressures. --> He was still not interested in considering ICD; not able to tolerate titration of medications.  Recent Hospitalizations: None  Reviewed  CV studies:    The following studies were reviewed today: (if available, images/films reviewed: From Epic Chart or Care Everywhere) . None:  Interval History:   Blake [name] is here today for routine follow-up doing quite well.  He still remains active and denies any sensations of any chest tightness pressure or dyspnea with rest or exertion.  He is now working mostly from home since the [employer].  (We spent several minutes talking about his concerns about Covid and the vaccine etc.  We went on to discuss data about the delta variant and questions about going forward with further vaccine issues etc. ->  This added an additional 72minutes to 20-25-minute visit).  He is still doing his routine activity.  Not really doing the jogging but is walking and doing some of his routine exercises.  He has actually been able lose a little bit of weight with  adjusting his diet and exercising.  He thinks that working from home gives him more freedom to be more active.  He just worries about indulging too much and eating.  Other than having some mild mucus congestion in his throat and nasal passages, he really is not having any cardiac symptoms.  CV Review of Symptoms (Summary) Cardiovascular ROS: no chest pain or dyspnea on exertion positive for - Some morning shortness of breath related to congestion; has lots of mucus and cough negative for - chest pain, dyspnea on exertion, edema, irregular heartbeat, orthopnea, palpitations, paroxysmal nocturnal dyspnea, rapid heart rate, shortness of breath or Syncope/near syncope TIA/amaurosis fugax, claudication  The patient does not have symptoms concerning for COVID-19 infection (fever, chills, cough, or new shortness of breath).  The patient is practicing social distancing & Masking.   Pfizer Covid vaccine injections-completed in April  REVIEWED OF SYSTEMS   Review of Systems  Constitutional: Positive for weight loss (Intentional). Negative for malaise/fatigue.  HENT: Positive for congestion (With drainage). Negative for sinus pain.   Respiratory: Positive for cough (Sounds like postnasal drip).   Gastrointestinal: Negative for blood in stool and melena.  Genitourinary: Negative for hematuria.  Musculoskeletal: Negative for falls and joint pain.  Neurological: Positive for dizziness (Sometimes if he stands up too fast or bends over). Negative for focal weakness and weakness.  Endo/Heme/Allergies: Positive for environmental allergies. Does not bruise/bleed easily.  Psychiatric/Behavioral: Negative.    --> negative  I have reviewed  and (if needed) personally updated the patient's problem list, medications, allergies, past medical and surgical history, social and family history.   PAST MEDICAL HISTORY   Past Medical History:  Diagnosis Date  . CAD (coronary artery disease), native coronary artery  01/2017   a. 01/2017: anterior STEMI with 100% occlusion of the prox-mid LAD with 95% distal LAD stenosis due to distal embolization of thrombus. A Synergy DES was placed to the prox-LAD.  Marland Kitchen Cancer [facility]) 2009   brain tumor  . Hyperlipemia   . Ischemic cardiomyopathy    a. 01/2017: initial echo showed a reduced EF of 30-35% with repeat echo of 40-45%. -->  However follow-up echo showed EF of 30-35%, EF confirmed to be 30% by MRI October 2018.  Marland Kitchen LUMBAR RADICULOPATHY, RIGHT 07/02/2008   Qualifier: Diagnosis of  By: Alain Marion MD, Evie Lacks Memory loss 07/05/2008   Qualifier: Diagnosis of  By: Alain Marion MD, [place] 07/05/2008   Qualifier: Diagnosis of  By: Alain Marion MD, Evie Lacks Neoplasm of unspecified nature of brain 08/18/2008   Qualifier: Diagnosis of  By: Alain Marion MD, Evie Lacks Rash and other nonspecific skin eruption 08/18/2008   Qualifier: Diagnosis of  By: Doralee Albino    . ST elevation myocardial infarction (STEMI) of anterior wall ([place]) 01/2017   Large anterior infarct.  . TOBACCO USER 11/28/2009   Qualifier: Diagnosis of  By: Alain Marion MD, Evie Lacks   . VITAMIN D DEFICIENCY 07/02/2008   Qualifier: Diagnosis of  By: Plotnikov MD, Evie Lacks     PAST SURGICAL HISTORY   Past Surgical History:  Procedure Laterality Date  . CORONARY STENT INTERVENTION N/A 02/23/2017   Procedure: Coronary Stent Intervention;  Surgeon: Leonie Man, MD;  Location: [facility] INVASIVE CV LAB;; p-mLAD 100% (thrombotic) --> PCI- SYNERGY DES 3 X 24 -- 3.1 mm; distal embolization to apical LAD.  Marland Kitchen CORONARY/GRAFT ACUTE MI REVASCULARIZATION N/A 02/23/2017   Procedure: Coronary/Graft Acute MI Revascularization;  Surgeon: Leonie Man, MD;  Location: [place] CV LAB;  Service: Cardiovascular;;; PCI of 100% thrombotic p-mLAD in setting of Anterior STEMI - delayed presentation  . CRANIECTOMY / [place] TUMOR  2009   surgery and XRT at [facility]  . LEFT HEART  CATH AND CORONARY ANGIOGRAPHY N/A 02/23/2017   Procedure: Left Heart Cath and Coronary Angiography;  Surgeon: Leonie Man, MD;  Location: [place] CV LAB;;; Delayed presentation of Anterior STEMI -- p-m LAD 100% thrombotic occlusion, 30% AVG Cx. EF ~35% (anterior-anteroapical severe hypokinesis  . TRANSTHORACIC ECHOCARDIOGRAM  11/2017   Anterior , septal, apical and inferior apical hypokinesis. The cavity size was moderately dilated. Wall  thickness was normal. Systolic function was severely reduced. Mild AoV calcification    2 D Echo 12/07/2017:  25-30%. Severely reduced function. Anterior , septal, apical and inferior apical hypokinesis. The cavity size was moderately dilated. Wall thickness was normal. Systolic function was severely reduced. Mild AoV calcification   Cath-PCI 01/2017: p-mLAD 100% (thrombotic) --> PCI- SYNERGY DES 3 X 24 -- 3.1 mm; distal embolization to apical LAD.  EF ~35% anterior HK.    MEDICATIONS/ALLERGIES   Current Meds  Medication Sig  . atorvastatin (LIPITOR) 40 MG tablet TAKE 1 TABLET BY MOUTH EVERY DAY  . BRILINTA 60 MG TABS tablet TAKE 1 TABLET (60 MG TOTAL) BY MOUTH 2 (TWO) TIMES DAILY.  . carvedilol (COREG) 3.125 MG tablet Take 1 tablet (3.125  mg total) by mouth 2 (two) times daily with a meal.  . cefdinir (OMNICEF) 300 MG capsule Take 1 capsule (300 mg total) by mouth 2 (two) times daily.  . Cholecalciferol (VITAMIN D3) 2000 units capsule Take 1 capsule (2,000 Units total) by mouth daily.  . nitroGLYCERIN (NITROSTAT) 0.4 MG SL tablet Place 1 tablet (0.4 mg total) under the tongue every 5 (five) minutes as needed.  . sacubitril-valsartan (ENTRESTO) 49-51 MG Take 1 tablet by mouth 2 (two) times daily.    No Known Allergies  SOCIAL HISTORY/FAMILY HISTORY   Reviewed in Epic:  Pertinent findings: Currently working from home.  OBJCTIVE -PE, EKG, labs   Wt Readings from Last 3 Encounters:  05/09/20 158 lb 6.4 oz (71.8 kg)  01/27/20 165 lb (74.8 kg)   06/22/19 166 lb 9.6 oz (75.6 kg)    Physical Exam: BP 102/68 (BP Location: Right Arm, Patient Position: Sitting, Cuff Size: Normal)   Pulse 64   Temp (!) 97.5 F (36.4 C) (Tympanic)   Resp 16   Wt 158 lb 6.4 oz (71.8 kg)   SpO2 97%   BMI 24.81 kg/m  Physical Exam Vitals reviewed.  Constitutional:      Appearance: Normal appearance. He is normal weight.     Comments: Healthy-appearing.  Well-groomed.  HENT:     Head: Normocephalic and atraumatic.  Neck:     Vascular: No carotid bruit, hepatojugular reflux or JVD.  Cardiovascular:     Rate and Rhythm: Normal rate and regular rhythm.  No extrasystoles are present.    Chest Wall: PMI is not displaced.     Pulses: Intact distal pulses.     Heart sounds: S1 normal and S2 normal. Murmur heard. High-pitched harsh crescendo-decrescendo early systolic murmur is present with a grade of 1/6 at the upper right sternal border.  No friction rub. No gallop.   Pulmonary:     Effort: Pulmonary effort is normal.     Breath sounds: Normal breath sounds.  Chest:     Chest wall: No tenderness.  Musculoskeletal:        General: No swelling. Normal range of motion.     Cervical back: Normal range of motion and neck supple.  Neurological:     General: No focal deficit present.     Mental Status: He is alert and oriented to person, place, and time.     Comments: Pleasant, albeit intense personality.  Psychiatric:        Mood and Affect: Mood normal.        Behavior: Behavior normal.        Thought Content: Thought content normal.        Judgment: Judgment normal.     Adult ECG Report  Rate: 64 ;  Rhythm: sinus bradycardia and RBBB.  (Not complete), anteroseptal MI (age undetermined)-with T wave inversions in lateral leads.;   Narrative Interpretation: Stable EKG.  Bundle branch block is now complete by bundle.  Recent Labs:    Lab Results  Component Value Date   CHOL 130 01/27/2020   HDL 45.00 01/27/2020   LDLCALC 70 01/27/2020    LDLDIRECT 161.0 05/14/2012   TRIG 72.0 01/27/2020   CHOLHDL 3 01/27/2020   Lab Results  Component Value Date   CREATININE 0.95 02/19/2020   BUN 18 02/19/2020   NA 138 02/19/2020   K 4.8 02/19/2020   CL 106 02/19/2020   CO2 26 02/19/2020   Lab Results  Component Value Date   TSH 0.61  01/27/2020   Lab Results  Component Value Date   HGBA1C 6.0 02/19/2020    ASSESSMENT/PLAN    Problem List Items Addressed This Visit    ST elevation myocardial infarction (STEMI), subsequent episode of care [facility]) (Chronic)    Large anterior MI with LAD thrombotic occlusion.  Large infarct noted on cardiac MRI, resulting in significant ischemic cardiomyopathy with reduced EF.  Despite this, he is not having any CHF or recurrent anginal symptoms.      Chronic combined systolic and diastolic heart failure, NYHA class 1 (HCC) (Chronic)    Again, significantly reduced EF on echo but really only class I CHF symptoms.  Blake [name] on exam.  Has not required diuretic.  Not able to titrate medications any further in the currently on.  Plan:   Continue current dose of carvedilol at 3.125 mg twice daily, moderate dose Entresto 49-51 mg twice daily.  No diuretic requirement.      Coronary artery disease involving native coronary artery of native heart without angina pectoris - Primary (Chronic)    Large anterior MI back in May 2018.  Unfortunately despite angiographic success, he had a pretty significant anterior wall scar on cardiac MRI. He remains on stable regimen with no active anginal symptoms. He asked about potentially stopping Brilinta, we discussed the location of his stent and concern is for restenosis, and he agreed to continue.  Plan:  Continue current dose of carvedilol and Entresto  Continue atorvastatin 40 mg  Continue maintenance dose of Brilinta 60 mg twice daily without aspirin. ->  Okay to hold for bleeding, and procedures as noted below.      Presence of drug coated stent in  LAD coronary artery (Chronic)    DES in the proximal LAD.  We talked about importance of maintenance therapy.  He is now over 2 years out, so we are out of the range of ongoing negative for benefit of Brilinta.  However we talked about risks and benefits of with maintaining current dosing.  He feels comfortable continuing Brilinta.   For now continue 60 mg twice daily Brilinta without aspirin  Okay to hold Brilinta 5 to 7 days preop for any surgeries or procedures.  Also okay to hold Brilinta for significant bleeding or bruising.  This would be for 3 to 5 days.      Hyperlipidemia LDL goal <70 (Chronic)    Lipids are pretty much at goal on current dose of atorvastatin.  Stable, no myalgias.  No change.      Hypotension due to drugs    Did not tolerate titration of Entresto or carvedilol.        Current medicines are reviewed at length with the patient today.  (+/- concerns) concerns about COVID-19, vaccinations, case transit center.   COVID-19 Education: The signs and symptoms of COVID-19 were discussed with the patient and how to seek care for testing (follow up with PCP or arrange E-visit).   The importance of social distancing and COVID-19 vaccination was discussed today.  I spent a total of 33 minutes with the patient. >  50% of the time was spent in direct patient consultation.  Additional time spent with chart review  / charting (studies, outside notes, etc): 10 Total Time: 43 min  Notice: This dictation was prepared with Dragon dictation along with smaller phrase technology. Any transcriptional errors that result from this process are unintentional and may not be corrected upon review.  Patient Instructions / Medication Changes & Studies & Tests  Ordered   Patient Instructions  Medication Instructions:  No changes *If you need a refill on your cardiac medications before your next appointment, please call your pharmacy*   Lab Work: Not needed If you have labs (blood  work) drawn today and your tests are completely normal, you will receive your results only by: Marland Kitchen MyChart Message (if you have MyChart) OR . A paper copy in the mail If you have any lab test that is abnormal or we need to change your treatment, we will call you to review the results.   Testing/Procedures: Not needed   Follow-Up: At [facility], you and your health needs are our priority.  As part of our continuing mission to provide you with exceptional heart care, we have created designated Provider Care Teams.  These Care Teams include your primary Cardiologist (physician) and Advanced Practice Providers (APPs -  Physician Assistants and Nurse Practitioners) who all work together to provide you with the care you need, when you need it.   Your next appointment:   12 month(s)  The format for your next appointment:   In Person  Provider:   Glenetta Hew, MD   Other Instructions     Studies Ordered:   No orders of the defined types were placed in this encounter.    Glenetta Hew, M.D., M.S. Interventional Cardiologist   Pager # [phone] Phone # [phone] [address] [place], [place] [address]   Thank you for choosing Heart[facility] at [facility]!!

## 2020-05-12 ENCOUNTER — Encounter: Payer: Self-pay | Admitting: Cardiology

## 2020-05-12 NOTE — Assessment & Plan Note (Addendum)
Large anterior MI back in May 2018.  Unfortunately despite angiographic success, he had a pretty significant anterior wall scar on cardiac MRI. He remains on stable regimen with no active anginal symptoms. He asked about potentially stopping Brilinta, we discussed the location of his stent and concern is for restenosis, and he agreed to continue.  Plan:  Continue current dose of carvedilol and Entresto  Continue atorvastatin 40 mg  Continue maintenance dose of Brilinta 60 mg twice daily without aspirin. ->  Okay to hold for bleeding, and procedures as noted below.

## 2020-05-12 NOTE — Addendum Note (Signed)
Addended by: Patria Mane A on: 05/12/2020 01:39 PM   Modules accepted: Orders

## 2020-05-12 NOTE — Assessment & Plan Note (Addendum)
DES in the proximal LAD.  We talked about importance of maintenance therapy.  He is now over 2 years out, so we are out of the range of ongoing negative for benefit of Brilinta.  However we talked about risks and benefits of with maintaining current dosing.  He feels comfortable continuing Brilinta.   For now continue 60 mg twice daily Brilinta without aspirin  Okay to hold Brilinta 5 to 7 days preop for any surgeries or procedures.  Also okay to hold Brilinta for significant bleeding or bruising.  This would be for 3 to 5 days.

## 2020-05-12 NOTE — Assessment & Plan Note (Signed)
Large anterior MI with LAD thrombotic occlusion.  Large infarct noted on cardiac MRI, resulting in significant ischemic cardiomyopathy with reduced EF.  Despite this, he is not having any CHF or recurrent anginal symptoms.

## 2020-05-12 NOTE — Assessment & Plan Note (Signed)
Again, significantly reduced EF on echo but really only class I CHF symptoms.  Euvolemic on exam.  Has not required diuretic.  Not able to titrate medications any further in the currently on.  Plan:   Continue current dose of carvedilol at 3.125 mg twice daily, moderate dose Entresto 49-51 mg twice daily.  No diuretic requirement.

## 2020-05-12 NOTE — Assessment & Plan Note (Signed)
Lipids are pretty much at goal on current dose of atorvastatin.  Stable, no myalgias.  No change.

## 2020-05-12 NOTE — Assessment & Plan Note (Addendum)
Did not tolerate titration of Entresto or carvedilol.

## 2020-06-14 ENCOUNTER — Other Ambulatory Visit: Payer: Self-pay | Admitting: Cardiology

## 2020-06-15 ENCOUNTER — Other Ambulatory Visit: Payer: Self-pay | Admitting: Internal Medicine

## 2020-06-15 IMAGING — DX CHEST - 2 VIEW
2 series · 2 of 2 positions shown · non-contrast
Comparison: Chest radiograph 02/23/2017

CLINICAL DATA: Cough

EXAM:
CHEST - 2 VIEW

[chest pa]
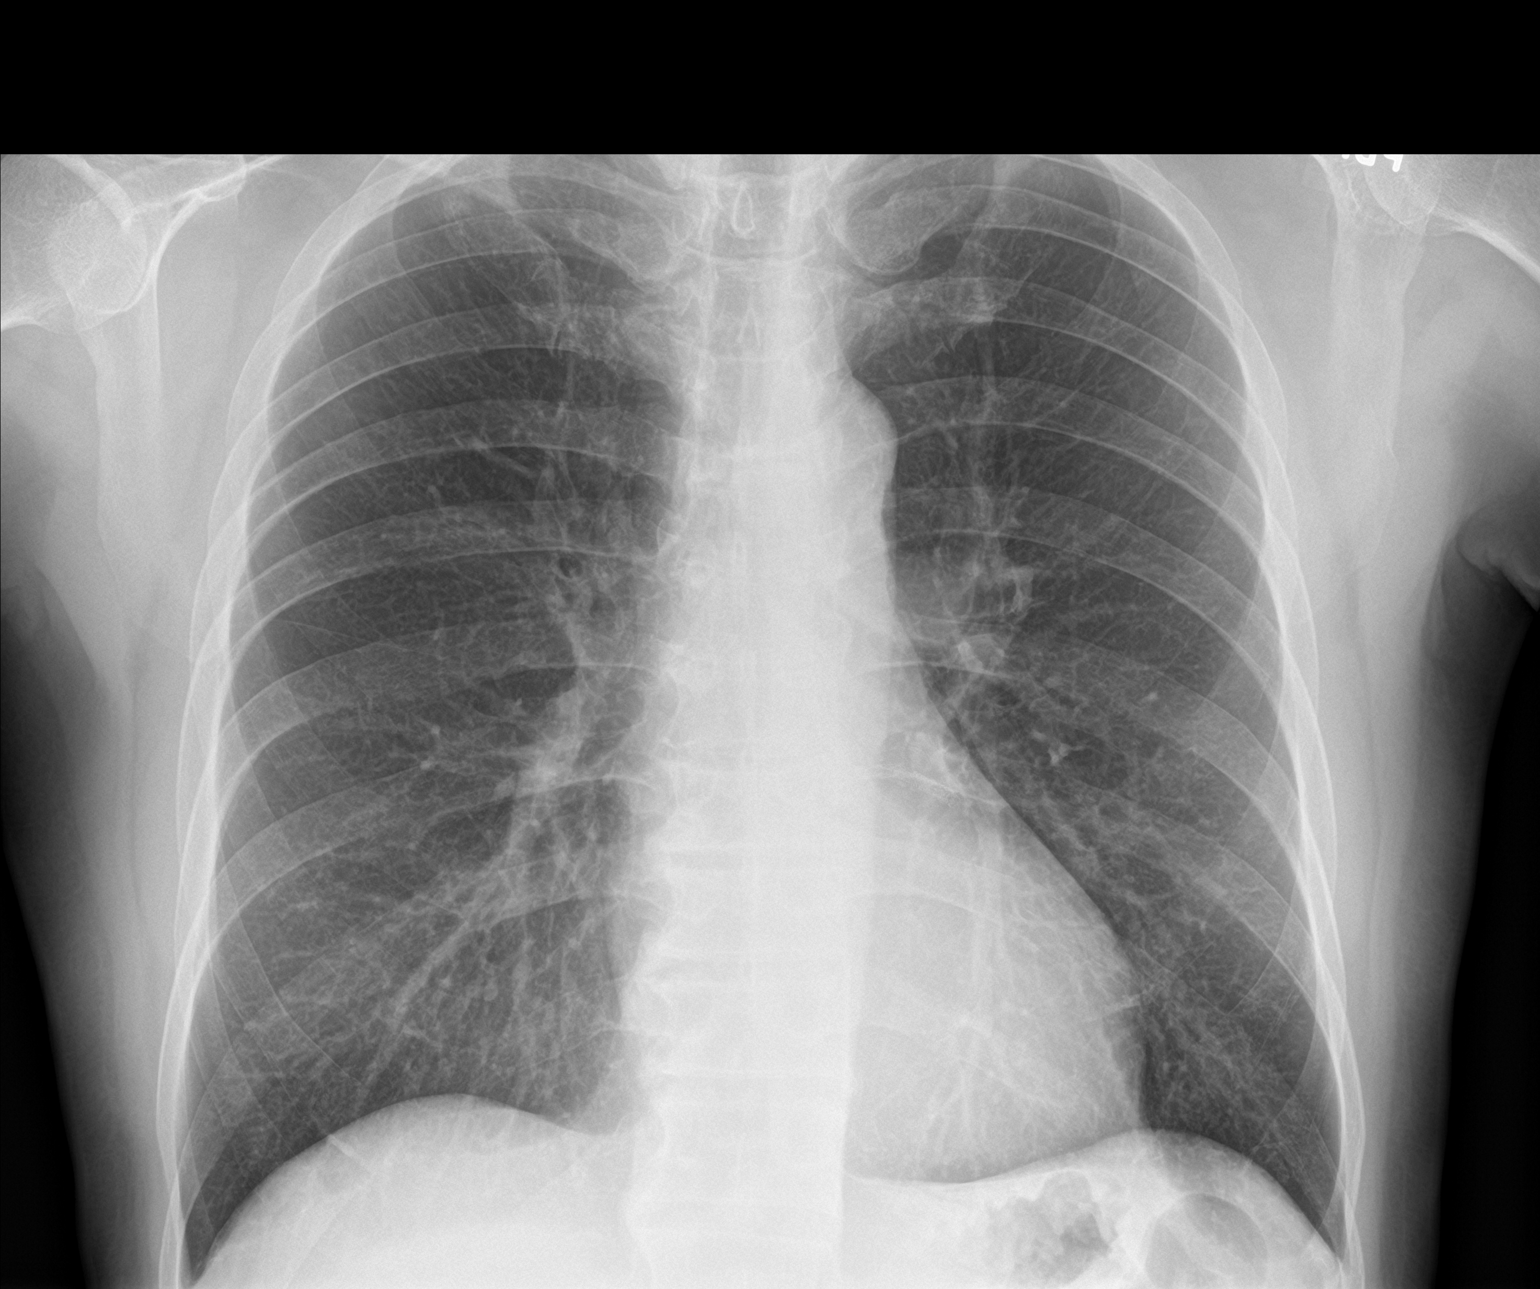

[chest lat]
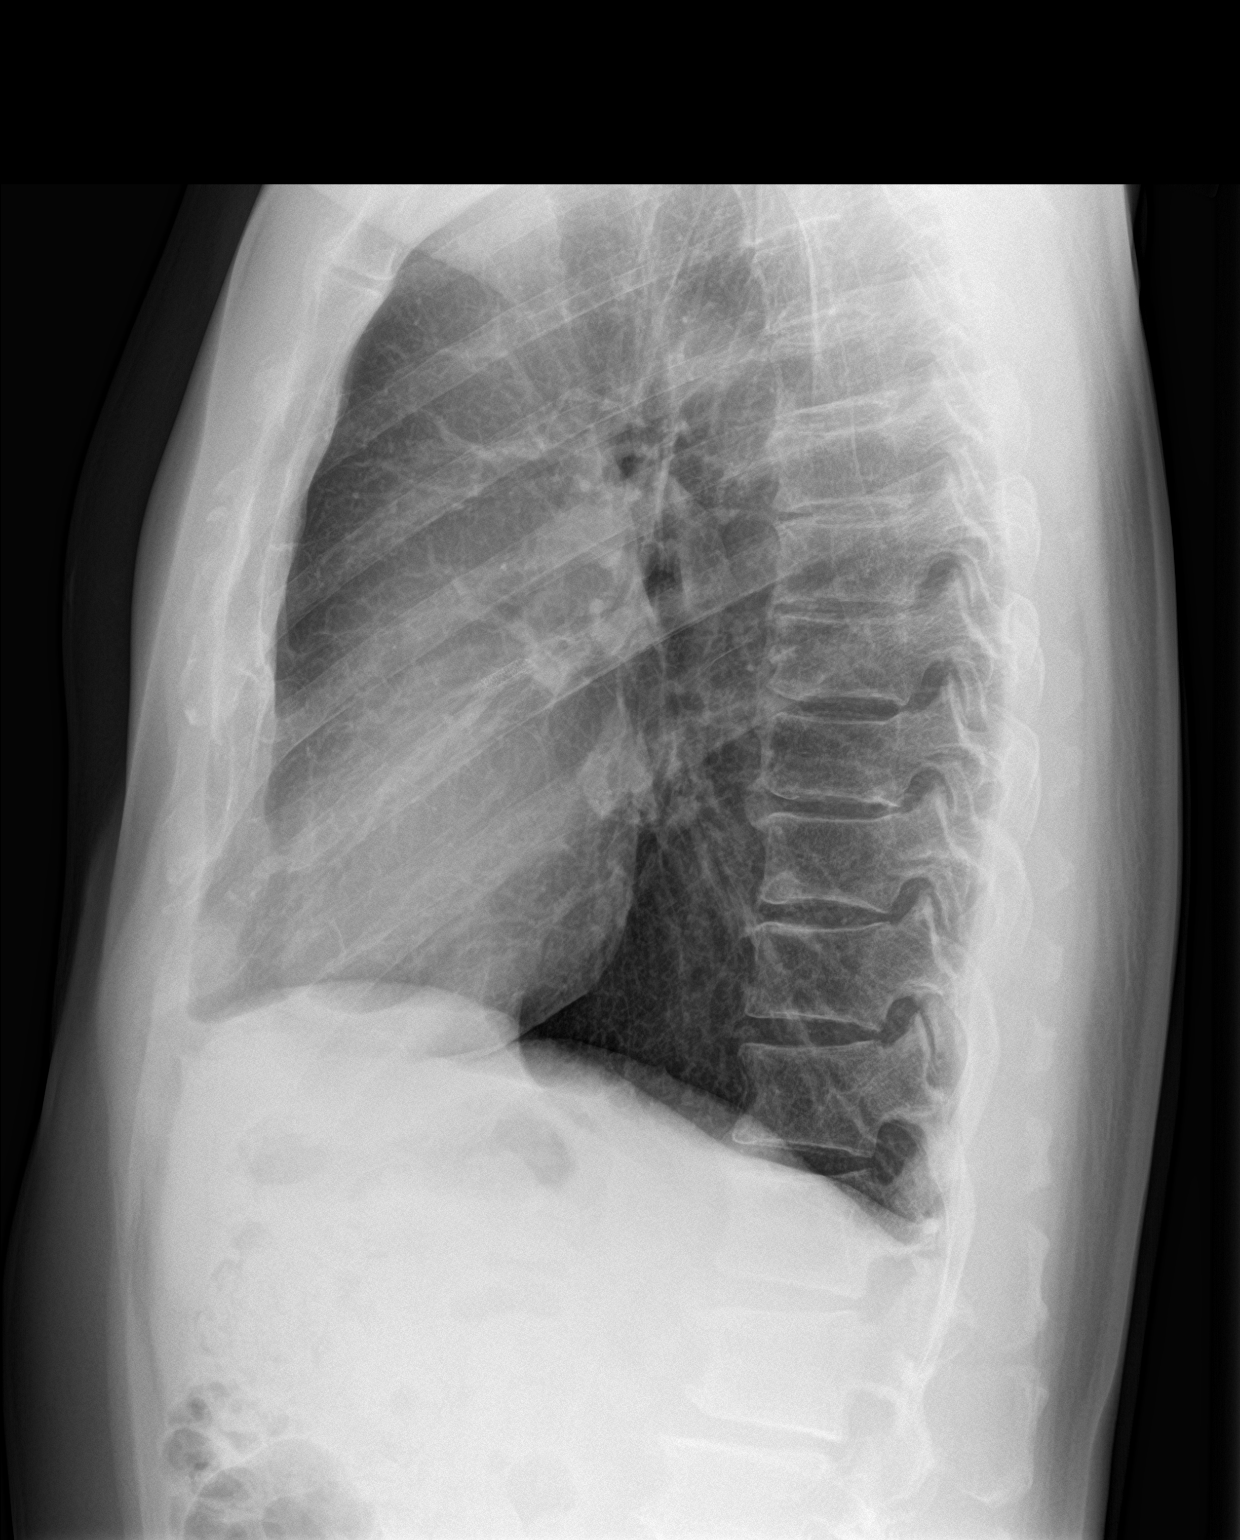

[2 of 2 positions shown; findings below may reference images not displayed]

FINDINGS: Normal cardiac and mediastinal contours. No consolidative pulmonary
opacities. No pleural effusion or pneumothorax. Thoracic spine
degenerative changes.
IMPRESSION: No acute cardiopulmonary process.

## 2020-08-07 ENCOUNTER — Other Ambulatory Visit: Payer: Self-pay | Admitting: Cardiology

## 2021-02-01 ENCOUNTER — Other Ambulatory Visit: Payer: Self-pay

## 2021-02-02 ENCOUNTER — Ambulatory Visit (INDEPENDENT_AMBULATORY_CARE_PROVIDER_SITE_OTHER): Payer: 59

## 2021-02-02 ENCOUNTER — Encounter: Payer: Self-pay | Admitting: Internal Medicine

## 2021-02-02 ENCOUNTER — Ambulatory Visit (INDEPENDENT_AMBULATORY_CARE_PROVIDER_SITE_OTHER): Payer: 59 | Admitting: Internal Medicine

## 2021-02-02 VITALS — BP 100/62 | HR 55 | Temp 98.4°F | Ht 67.0 in | Wt 154.0 lb

## 2021-02-02 DIAGNOSIS — Z23 Encounter for immunization: Secondary | ICD-10-CM

## 2021-02-02 DIAGNOSIS — D320 Benign neoplasm of cerebral meninges: Secondary | ICD-10-CM | POA: Diagnosis not present

## 2021-02-02 DIAGNOSIS — I2510 Atherosclerotic heart disease of native coronary artery without angina pectoris: Secondary | ICD-10-CM

## 2021-02-02 DIAGNOSIS — Z0000 Encounter for general adult medical examination without abnormal findings: Secondary | ICD-10-CM | POA: Diagnosis not present

## 2021-02-02 DIAGNOSIS — E041 Nontoxic single thyroid nodule: Secondary | ICD-10-CM

## 2021-02-02 DIAGNOSIS — R739 Hyperglycemia, unspecified: Secondary | ICD-10-CM | POA: Diagnosis not present

## 2021-02-02 DIAGNOSIS — E559 Vitamin D deficiency, unspecified: Secondary | ICD-10-CM

## 2021-02-02 LAB — URINALYSIS
Bilirubin Urine: NEGATIVE
Hgb urine dipstick: NEGATIVE
Ketones, ur: NEGATIVE
Leukocytes,Ua: NEGATIVE
Nitrite: NEGATIVE
Specific Gravity, Urine: 1.015 (ref 1.000–1.030)
Total Protein, Urine: NEGATIVE
Urine Glucose: NEGATIVE
Urobilinogen, UA: 0.2 (ref 0.0–1.0)
pH: 6 (ref 5.0–8.0)

## 2021-02-02 LAB — COMPREHENSIVE METABOLIC PANEL
ALT: 15 U/L (ref 0–53)
AST: 20 U/L (ref 0–37)
Albumin: 4.3 g/dL (ref 3.5–5.2)
Alkaline Phosphatase: 83 U/L (ref 39–117)
BUN: 24 mg/dL — ABNORMAL HIGH (ref 6–23)
CO2: 27 mEq/L (ref 19–32)
Calcium: 9.2 mg/dL (ref 8.4–10.5)
Chloride: 106 mEq/L (ref 96–112)
Creatinine, Ser: 1 mg/dL (ref 0.40–1.50)
GFR: 80.67 mL/min (ref >60.00)
Glucose, Bld: 100 mg/dL — ABNORMAL HIGH (ref 70–99)
Potassium: 4.9 mEq/L (ref 3.5–5.1)
Sodium: 140 mEq/L (ref 135–145)
Total Bilirubin: 0.8 mg/dL (ref 0.2–1.2)
Total Protein: 6.6 g/dL (ref 6.0–8.3)

## 2021-02-02 LAB — CBC WITH DIFFERENTIAL/PLATELET
Basophils Absolute: 0 10*3/uL (ref 0.0–0.1)
Basophils Relative: 0.5 % (ref 0.0–3.0)
Eosinophils Absolute: 0.2 10*3/uL (ref 0.0–0.7)
Eosinophils Relative: 3.5 % (ref 0.0–5.0)
HCT: 40.6 % (ref 39.0–52.0)
Hemoglobin: 13.8 g/dL (ref 13.0–17.0)
Lymphocytes Relative: 21.3 % (ref 12.0–46.0)
Lymphs Abs: 1.3 10*3/uL (ref 0.7–4.0)
MCHC: 33.9 g/dL (ref 30.0–36.0)
MCV: 87.5 fl (ref 78.0–100.0)
Monocytes Absolute: 0.3 10*3/uL (ref 0.1–1.0)
Monocytes Relative: 5.2 % (ref 3.0–12.0)
Neutro Abs: 4.3 10*3/uL (ref 1.4–7.7)
Neutrophils Relative %: 69.5 % (ref 43.0–77.0)
Platelets: 215 10*3/uL (ref 150.0–400.0)
RBC: 4.63 Mil/uL (ref 4.22–5.81)
RDW: 13.7 % (ref 11.5–15.5)
WBC: 6.2 10*3/uL (ref 4.0–10.5)

## 2021-02-02 LAB — LIPID PANEL
Cholesterol: 116 mg/dL (ref 0–200)
HDL: 44.6 mg/dL (ref >39.00)
LDL Cholesterol: 61 mg/dL (ref 0–99)
NonHDL: 71.37
Total CHOL/HDL Ratio: 3
Triglycerides: 53 mg/dL (ref 0.0–149.0)
VLDL: 10.6 mg/dL (ref 0.0–40.0)

## 2021-02-02 LAB — PSA: PSA: 0.84 ng/mL (ref 0.10–4.00)

## 2021-02-02 LAB — T4, FREE: Free T4: 0.88 ng/dL (ref 0.60–1.60)

## 2021-02-02 LAB — TSH: TSH: 0.65 u[IU]/mL (ref 0.35–4.50)

## 2021-02-02 LAB — HEMOGLOBIN A1C: Hgb A1c MFr Bld: 6.1 % (ref 4.6–6.5)

## 2021-02-02 MED ORDER — VITAMIN D3 50 MCG (2000 UT) PO CAPS: 2000.0000 [IU] | ORAL_CAPSULE | Freq: Every day | ORAL | 3 refills | Status: AC

## 2021-02-02 MED ORDER — IPRATROPIUM BROMIDE 0.06 % NA SOLN: 2.0000 | Freq: Three times a day (TID) | NASAL | 5 refills | Status: DC

## 2021-02-02 MED ORDER — NITROGLYCERIN 0.4 MG SL SUBL
0.4000 mg | SUBLINGUAL_TABLET | SUBLINGUAL | 3 refills | Status: DC | PRN
Start: 2021-02-02 — End: 2022-07-03

## 2021-02-02 MED ORDER — TRIAMCINOLONE ACETONIDE 0.5 % EX CREA: 1.0000 "application " | TOPICAL_CREAM | Freq: Three times a day (TID) | CUTANEOUS | 1 refills | Status: AC

## 2021-02-02 NOTE — Assessment & Plan Note (Signed)
MRI q 24 mo at [facility]

## 2021-02-02 NOTE — Assessment & Plan Note (Addendum)
Thyroid US - last 2020 TSH, FT4 Repeat US

## 2021-02-02 NOTE — Progress Notes (Signed)
Subjective:  Patient ID: Blake [name], male    DOB: 03-Feb-1956  Age: 65 y.o. MRN: [id number]  CC: Annual Exam   HPI Blake [name] presents for a well exam  Outpatient Medications Prior to Visit  Medication Sig Dispense Refill  . atorvastatin (LIPITOR) 40 MG tablet TAKE 1 TABLET BY MOUTH EVERY DAY 90 tablet 3  . BRILINTA 60 MG TABS tablet TAKE 1 TABLET (60 MG TOTAL) BY MOUTH 2 (TWO) TIMES DAILY. 180 tablet 1  . carvedilol (COREG) 3.125 MG tablet TAKE 1 TABLET (3.125 MG TOTAL) BY MOUTH 2 (TWO) TIMES DAILY WITH A MEAL. 180 tablet 3  . ENTRESTO 49-51 MG TAKE 1 TABLET BY MOUTH TWICE A DAY 180 tablet 3  . nitroGLYCERIN (NITROSTAT) 0.4 MG SL tablet Place 1 tablet (0.4 mg total) under the tongue every 5 (five) minutes as needed. 25 tablet 3  . ipratropium (ATROVENT) 0.06 % nasal spray PLACE 2 SPRAYS INTO THE NOSE 3 (THREE) TIMES DAILY. 15 mL 5  . cefdinir (OMNICEF) 300 MG capsule Take 1 capsule (300 mg total) by mouth 2 (two) times daily. (Patient not taking: Reported on 02/02/2021) 20 capsule 0  . Cholecalciferol (VITAMIN D3) 2000 units capsule Take 1 capsule (2,000 Units total) by mouth daily. (Patient not taking: Reported on 02/02/2021) 100 capsule 3  . loratadine (CLARITIN) 10 MG tablet Take 1 tablet (10 mg total) by mouth daily. (Patient not taking: No sig reported) 30 tablet 11   No facility-administered medications prior to visit.    ROS: Review of Systems  Constitutional: Negative for appetite change, fatigue and unexpected weight change.  HENT: Negative for congestion, nosebleeds, sneezing, sore throat and trouble swallowing.   Eyes: Negative for itching and visual disturbance.  Respiratory: Negative for cough.   Cardiovascular: Negative for chest pain, palpitations and leg swelling.  Gastrointestinal: Negative for abdominal distention, blood in stool, diarrhea and nausea.  Genitourinary: Negative for frequency and hematuria.  Musculoskeletal: Negative for back pain, gait problem,  joint swelling and neck pain.  Skin: Negative for rash.  Neurological: Negative for dizziness, tremors, speech difficulty and weakness.  Psychiatric/Behavioral: Negative for agitation, dysphoric mood and sleep disturbance. The patient is not nervous/anxious.     Objective:  BP 100/62 (BP Location: Left Arm)   Pulse (!) 55   Temp 98.4 F (36.9 C) (Oral)   Ht 5\' 7"  (1.702 m)   Wt 154 lb (69.9 kg)   SpO2 98%   BMI 24.12 kg/m   BP Readings from Last 3 Encounters:  02/02/21 100/62  05/09/20 102/68  01/27/20 116/70    Wt Readings from Last 3 Encounters:  02/02/21 154 lb (69.9 kg)  05/09/20 158 lb 6.4 oz (71.8 kg)  01/27/20 165 lb (74.8 kg)    Physical Exam Constitutional:      General: He is not in acute distress.    Appearance: He is well-developed.     Comments: NAD  Eyes:     Conjunctiva/sclera: Conjunctivae normal.     Pupils: Pupils are equal, round, and reactive to light.  Neck:     Thyroid: No thyromegaly.     Vascular: No JVD.  Cardiovascular:     Rate and Rhythm: Normal rate and regular rhythm.     Heart sounds: Normal heart sounds. No murmur heard. No friction rub. No gallop.   Pulmonary:     Effort: Pulmonary effort is normal. No respiratory distress.     Breath sounds: Normal breath sounds. No wheezing or rales.  Chest:  Chest wall: No tenderness.  Abdominal:     General: Bowel sounds are normal. There is no distension.     Palpations: Abdomen is soft. There is no mass.     Tenderness: There is no abdominal tenderness. There is no guarding or rebound.  Musculoskeletal:        General: No tenderness. Normal range of motion.     Cervical back: Normal range of motion.  Lymphadenopathy:     Cervical: No cervical adenopathy.  Skin:    General: Skin is warm and dry.     Findings: No rash.  Neurological:     Mental Status: He is alert and oriented to person, place, and time.     Cranial Nerves: No cranial nerve deficit.     Motor: No abnormal muscle  tone.     Coordination: Coordination normal.     Gait: Gait normal.     Deep Tendon Reflexes: Reflexes are normal and symmetric.  Psychiatric:        Behavior: Behavior normal.        Thought Content: Thought content normal.        Judgment: Judgment normal.    Pt declined rectal exam  Lab Results  Component Value Date   WBC 6.9 01/27/2020   HGB 13.8 01/27/2020   HCT 41.3 01/27/2020   PLT 246.0 01/27/2020   GLUCOSE 106 (H) 02/19/2020   CHOL 130 01/27/2020   TRIG 72.0 01/27/2020   HDL 45.00 01/27/2020   LDLDIRECT 161.0 05/14/2012   LDLCALC 70 01/27/2020   ALT 19 01/27/2020   AST 25 01/27/2020   NA 138 02/19/2020   K 4.8 02/19/2020   CL 106 02/19/2020   CREATININE 0.95 02/19/2020   BUN 18 02/19/2020   CO2 26 02/19/2020   TSH 0.61 01/27/2020   PSA 0.97 01/27/2020   INR 0.9 07/07/2008   HGBA1C 6.0 02/19/2020    US THYROID  Result Date: 04/07/2019 CLINICAL DATA:  65 year old male with a history of thyroid goiter EXAM: THYROID ULTRASOUND TECHNIQUE: Ultrasound examination of the thyroid gland and adjacent soft tissues was performed. COMPARISON:  01/01/2017, 12/30/2015 FINDINGS: Parenchymal Echotexture: Mildly heterogenous Isthmus: 0.5 cm Right lobe: 6.0 cm x 2.4 cm x 2.6 cm Left lobe: 6.3 cm x 2.4 cm x 2.3 cm _________________________________________________________ Estimated total number of nodules >/= 1 cm: 4 Number of spongiform nodules >/=  2 cm not described below (TR1): 0 Number of mixed cystic and solid nodules >/= 1.5 cm not described below (TR2): 0 _________________________________________________________ Nodule labeled 1 in the right thyroid decreased in size with decompression of cystic component, now measuring 9 mm, previously 1.4 cm. Does not meet criteria for surveillance or biopsy. Nodule 2 remains spongiform and does not meet criteria for surveillance or biopsy Nodule 3 on the left continues to decrease in size with decompression of the cystic component, currently 1.0  cm, previously 1.4 cm. Continued surveillance may be reasonable. Nodule labeled 4 on the left appears spongiform and does not meet criteria for surveillance or biopsy. Nodule labeled 5 on the left with cystic components does not meet criteria for surveillance or biopsy Nodule 6 on the left measures 0.96 cm with spongiform characteristics and does not meet criteria for surveillance or biopsy. No adenopathy. IMPRESSION: Left superior thyroid nodule open (labeled 3) continues to decrease in size, which is most compatible with benign nodule. Continued surveillance may be reasonable up to 5 years. No other thyroid nodule meets criteria for biopsy or surveillance, as designated by  the newly established ACR TI-RADS criteria. Recommendations follow those established by the new ACR TI-RADS criteria (J Am Coll Radiol 9563;87:564-332). Electronically Signed   By: Corrie Mckusick D.O.   On: 04/07/2019 14:50    Assessment & Plan:    Walker Kehr, MD

## 2021-02-02 NOTE — Assessment & Plan Note (Signed)
On Vit D 

## 2021-02-02 NOTE — Assessment & Plan Note (Signed)
  We discussed age appropriate health related issues, including available/recomended screening tests and vaccinations. Labs were ordered to be later reviewed . All questions were answered. We discussed one or more of the following - seat belt use, use of sunscreen/sun exposure exercise, safe sex, fall risk reduction, second hand smoke exposure, firearm use and storage, seat belt use, a need for adhering to healthy diet and exercise. Labs were ordered.  All questions were answered. Declined colonoscopy Opth q 24 mo Shingrix advised Cologuard (colonoscopy declined) 2017, 2020

## 2021-02-11 ENCOUNTER — Other Ambulatory Visit: Payer: Self-pay | Admitting: Cardiology

## 2021-02-17 ENCOUNTER — Encounter: Payer: Self-pay | Admitting: Internal Medicine

## 2021-02-17 ENCOUNTER — Other Ambulatory Visit: Payer: Self-pay | Admitting: Internal Medicine

## 2021-02-17 DIAGNOSIS — R739 Hyperglycemia, unspecified: Secondary | ICD-10-CM

## 2021-02-17 DIAGNOSIS — E785 Hyperlipidemia, unspecified: Secondary | ICD-10-CM

## 2021-03-09 ENCOUNTER — Ambulatory Visit
Admission: RE | Admit: 2021-03-09 | Discharge: 2021-03-09 | Disposition: A | Discharge status: Home / Self Care | Payer: 59 | Source: Ambulatory Visit | Attending: Internal Medicine | Admitting: Internal Medicine

## 2021-05-23 ENCOUNTER — Other Ambulatory Visit (INDEPENDENT_AMBULATORY_CARE_PROVIDER_SITE_OTHER): Payer: 59

## 2021-05-23 DIAGNOSIS — E785 Hyperlipidemia, unspecified: Secondary | ICD-10-CM | POA: Diagnosis not present

## 2021-05-23 DIAGNOSIS — R739 Hyperglycemia, unspecified: Secondary | ICD-10-CM | POA: Diagnosis not present

## 2021-05-23 LAB — LIPID PANEL
Cholesterol: 115 mg/dL (ref 0–200)
HDL: 39.3 mg/dL (ref >39.00)
LDL Cholesterol: 60 mg/dL (ref 0–99)
NonHDL: 75.39
Total CHOL/HDL Ratio: 3
Triglycerides: 76 mg/dL (ref 0.0–149.0)
VLDL: 15.2 mg/dL (ref 0.0–40.0)

## 2021-05-23 LAB — BASIC METABOLIC PANEL
BUN: 18 mg/dL (ref 6–23)
CO2: 23 mEq/L (ref 19–32)
Calcium: 9.2 mg/dL (ref 8.4–10.5)
Chloride: 106 mEq/L (ref 96–112)
Creatinine, Ser: 0.94 mg/dL (ref 0.40–1.50)
GFR: 86.7 mL/min (ref >60.00)
Glucose, Bld: 107 mg/dL — ABNORMAL HIGH (ref 70–99)
Potassium: 4.2 mEq/L (ref 3.5–5.1)
Sodium: 139 mEq/L (ref 135–145)

## 2021-05-23 LAB — HEMOGLOBIN A1C: Hgb A1c MFr Bld: 5.8 % (ref 4.6–6.5)

## 2021-05-26 ENCOUNTER — Other Ambulatory Visit: Payer: Self-pay | Admitting: Cardiology

## 2021-05-27 ENCOUNTER — Other Ambulatory Visit: Payer: Self-pay | Admitting: Cardiology

## 2021-06-12 ENCOUNTER — Ambulatory Visit: Payer: 59 | Admitting: Cardiology

## 2021-06-12 ENCOUNTER — Other Ambulatory Visit: Payer: Self-pay

## 2021-06-12 ENCOUNTER — Encounter: Payer: Self-pay | Admitting: Cardiology

## 2021-06-12 VITALS — BP 112/70 | HR 65 | Ht 67.0 in | Wt 153.2 lb

## 2021-06-12 DIAGNOSIS — Z0000 Encounter for general adult medical examination without abnormal findings: Secondary | ICD-10-CM | POA: Diagnosis not present

## 2021-06-12 DIAGNOSIS — I255 Ischemic cardiomyopathy: Secondary | ICD-10-CM

## 2021-06-12 DIAGNOSIS — E785 Hyperlipidemia, unspecified: Secondary | ICD-10-CM

## 2021-06-12 DIAGNOSIS — I5042 Chronic combined systolic (congestive) and diastolic (congestive) heart failure: Secondary | ICD-10-CM

## 2021-06-12 DIAGNOSIS — I2510 Atherosclerotic heart disease of native coronary artery without angina pectoris: Secondary | ICD-10-CM

## 2021-06-12 DIAGNOSIS — I213 ST elevation (STEMI) myocardial infarction of unspecified site: Secondary | ICD-10-CM

## 2021-06-12 DIAGNOSIS — Z955 Presence of coronary angioplasty implant and graft: Secondary | ICD-10-CM

## 2021-06-12 NOTE — Patient Instructions (Signed)
Medication Instructions:  Your physician has recommended you make the following change in your medication:  - DECREASE LIPITOR to 20 mg tablet (half tablet) by mouth ONCE daily - for 30 days and reevaluate after primary does lab work in 3 months    *If you need a refill on your cardiac medications before your next appointment, please call your pharmacy*   Lab Work: Followed by Primary Care   If you have labs (blood work) drawn today and your tests are completely normal, you will receive your results only by: [place] (if you have MyChart) OR A paper copy in the mail If you have any lab test that is abnormal or we need to change your treatment, we will call you to review the results.   Testing/Procedures: none   Follow-Up: At [facility], you and your health needs are our priority.  As part of our continuing mission to provide you with exceptional heart care, we have created designated Provider Care Teams.  These Care Teams include your primary Cardiologist (physician) and Advanced Practice Providers (APPs -  Physician Assistants and Nurse Practitioners) who all work together to provide you with the care you need, when you need it.  We recommend signing up for the patient portal called "MyChart".  Sign up information is provided on this After Visit Summary.  MyChart is used to connect with patients for Virtual Visits (Telemedicine).  Patients are able to view lab/test results, encounter notes, upcoming appointments, etc.  Non-urgent messages can be sent to your provider as well.   To learn more about what you can do with MyChart, go to [email or web address].    Your next appointment:   12 month(s)  The format for your next appointment:   In Person  Provider:   Glenetta Hew, MD   Other Instructions

## 2021-06-12 NOTE — Progress Notes (Signed)
Primary Care Provider: Cassandria Anger, MD Cardiologist: Glenetta Hew, MD Electrophysiologist: Constance Haw, MD  Clinic Note: Chief Complaint  Patient presents with   Follow-up    Annual.   Coronary Artery Disease    No angina   Cardiomyopathy    Despite having significantly reduced EF, he denies any CHF symptoms.   Hyperlipidemia    He is concerned about the effects of atorvastatin on glycemic control. ->  He brought an article to review.    ===================================  ASSESSMENT/PLAN   Problem List Items Addressed This Visit       Cardiology Problems   ST elevation myocardial infarction (STEMI), subsequent episode of care [facility]) (Chronic)    Unfortunately, with the thrombotic LAD occlusion and distal embolization he had a large anterior infarct seen on cardiac MRI and echocardiogram.  Fortunately, despite having a severe ischemic cardiomyopathy with EF of roughly 30%, he is not having any CHF or anginal symptoms.  NYHA class I symptoms on stable medications.      Ischemic cardiomyopathy (Chronic)    NYHA Class I CHF symptoms.  On stable regimen with no PND, orthopnea or edema.  Euvolemic on exam.  No diuretic requirement.  Plan: Continue current dose of Entresto 49 and 51 mg twice daily along with carvedilol 3.125 mg twice daily. If there is concern about possible diabetes in the future, we can consider SGLT2 inhibitors (with all the discussions about statins and glycemic control etc., I chose to not delve into this.      Chronic combined systolic and diastolic heart failure, NYHA class 1 (HCC) (Chronic)    Significant reduced EF on Echo but Only Class I Symptoms.  Plan: Continue current dose of Entresto and carvedilol. At present, euvolemic, and no diuretic requirement. Would like to address SGLT2 inhibitor, but with such a long discussion about statins, we did not get into that.-He is very suspicious of medications      Coronary artery  disease involving native coronary artery of native heart without angina pectoris (Chronic)    Now more than 4 years out from his large anterior STEMI with LAD PCI.  Good angiographic success, but likely late presentation would be amount of thrombus noted and distal embolization led to severely reduced EF and scar on echo/cardiac MRI.  Marland Kitchen  Really was not that much residual CAD noted.  Plan: Continue current dose of carvedilol and Entresto. Continue statin, but we will adjust as noted-reduce to 20 mg daily As per previous discussions, will continue maintenance dose Brilinta 60 mg twice daily without aspirin. Okay to hold Brilinta 5 to 7 days preop for surgical procedure.      Hyperlipidemia LDL goal <70 (Chronic)    His labs look great with an LDL of 68 on current dose of atorvastatin, but he is concerned about the effect on his glycemic control.  There has been data suggest statins may need to higher glycemic levels, but there is a trade off between glycemic control and lipid control.  We had a long discussion about this.  Since his levels are very well controlled, plan for now is to drop to 20 mg of atorvastatin and then recheck both lipids and chemistry panel with A1c in 3 months to see if there is a change.   -> If there is no appreciable change, make sense to just go back to his standing dose of statin.   --> Otherwise, we could potentially consider converting to rosuvastatin 20 mg.  Also,  if there is concern about glycemic control and lipids are stable, would then address potential of using osteopenia but especially for cardiomyopathy benefit.        Other   Presence of drug coated stent in LAD coronary artery (Chronic)    Now 4 years out from PCI to the LAD with DES stent.  We discussed the risks and benefits of maintaining therapy with Brilinta or switching to Plavix versus aspirin alone.  Based on how severe his existing cardiomyopathy is, he would perform a secondary prevention  benefit of maintaining Thienopyridine therapy.  Plan: Continue Brilinta 60 mg twice daily without aspirin. Oka change y to hold Brilinta 5 to 7 days preop for any surgical procedures.      Other Visit Diagnoses     Annual physical exam    -  Primary   Relevant Orders   EKG 12-Lead (Completed)       ===================================  HPI:    Blake [name] is a 65 y.o. male with a PMH notable for history of large anterior STEMI (PCI LAD) with resultant ICM (EF 25 to 30% -> patient declined ICD) with NYHA class I-II CHF who presents today for annual follow-up.  Blake Boles was last seen on 05/12/2020 -doing well.  Remaining active with no sensation of chest tightness or pressure with rest or exertion.  Still working from home during [employer]  He had concerns about COVID-vaccine-lead prolonged discussion.  He notes that he was doing some type of routine of walking or jogging most days.  He was try to lose weight.  Working from home gives him freedom.  Not you are traveling.  Was troubled by allergies.  Recent Hospitalizations: None  Reviewed  CV studies:    The following studies were reviewed today: (if available, images/films reviewed: From Epic Chart or Care Everywhere) None:  Interval History:   Blake Rueter presents here today for annual follow-up doing quite well.  He really has no major complaints.  His biggest concern is that he has been really try to work on his diet, by reducing his carbohydrates etc. because of concerns about diabetes.  He was worried about his A1c going up and now he is questioning statin use and that may be his statin is leading to his worsening glycemic control.  He had done research reading articles about this and brought some in with him.-As is usually the case this then led to a prolonged discussion. From a symptomatic standpoint, he is doing fine with no complaints of angina or heart failure symptoms.  No arrhythmia symptoms.  CV Review of  Symptoms (Summary) Cardiovascular ROS: no chest pain or dyspnea on exertion positive for - he notes that he may get a little dizzy off and on when he stands up too quickly, but not routinely.  He thinks he may be having some memory issues negative for - edema, irregular heartbeat, orthopnea, palpitations, paroxysmal nocturnal dyspnea, rapid heart rate, shortness of breath, or syncope/near syncope or TIA/amaurosis fugax, claudication. He denies any true myalgias or arthralgias.  No melena, hematochezia or hematuria.  Epistaxis.  REVIEWED OF SYSTEMS   Review of Systems  Constitutional:  Negative for malaise/fatigue and weight loss.  HENT:  Negative for congestion and nosebleeds.   Respiratory:  Negative for cough and shortness of breath.   Cardiovascular: Negative.   Gastrointestinal:  Negative for abdominal pain, blood in stool and melena.  Genitourinary:  Negative for hematuria.  Musculoskeletal:  Negative for joint pain and myalgias.  Neurological:  Negative for dizziness, focal weakness and weakness.  Endo/Heme/Allergies:  Positive for environmental allergies (Not currently bothering him right now.).  Psychiatric/Behavioral: Negative.     I have reviewed and (if needed) personally updated the patient's problem list, medications, allergies, past medical and surgical history, social and family history.   PAST MEDICAL HISTORY   Past Medical History:  Diagnosis Date   CAD (coronary artery disease), native coronary artery 01/2017   a. 01/2017: anterior STEMI with 100% occlusion of the prox-mid LAD with 95% distal LAD stenosis due to distal embolization of thrombus. A Synergy DES was placed to the prox-LAD.   Cancer [facility]) 2009   brain tumor   Hyperlipemia    Ischemic cardiomyopathy    a. 01/2017: initial echo showed a reduced EF of 30-35% with repeat echo of 40-45%. -->  However follow-up echo showed EF of 30-35%, EF confirmed to be 30% by MRI October 2018.   LUMBAR RADICULOPATHY, RIGHT  07/02/2008   Qualifier: Diagnosis of  By: Alain Marion MD, Evie Lacks    Memory loss 07/05/2008   Qualifier: Diagnosis of  By: Alain Marion MD, [place] 07/05/2008   Qualifier: Diagnosis of  By: Alain Marion MD, Evie Lacks    Neoplasm of unspecified nature of brain 08/18/2008   Qualifier: Diagnosis of  By: Alain Marion MD, Evie Lacks    Rash and other nonspecific skin eruption 08/18/2008   Qualifier: Diagnosis of  By: Doralee Albino     ST elevation myocardial infarction (STEMI) of anterior wall ([place]) 01/2017   Large anterior infarct.   TOBACCO USER 11/28/2009   Qualifier: Diagnosis of  By: Plotnikov MD, Evie Lacks    VITAMIN D DEFICIENCY 07/02/2008   Qualifier: Diagnosis of  By: Plotnikov MD, Evie Lacks     PAST SURGICAL HISTORY -> reviewed in epic.   Past Surgical History:  Procedure Laterality Date   CORONARY STENT INTERVENTION N/A 02/23/2017   Procedure: Coronary Stent Intervention;  Surgeon: Leonie Man, MD;  Location: [facility] INVASIVE CV LAB;; p-mLAD 100% (thrombotic) --> PCI- SYNERGY DES 3 X 24 -- 3.1 mm; distal embolization to apical LAD.   CORONARY/GRAFT ACUTE MI REVASCULARIZATION N/A 02/23/2017   Procedure: Coronary/Graft Acute MI Revascularization;  Surgeon: Leonie Man, MD;  Location: [facility] CV LAB;  Service: Cardiovascular;;; PCI of 100% thrombotic p-mLAD in setting of Anterior STEMI - delayed presentation   LEFT HEART CATH AND CORONARY ANGIOGRAPHY N/A 02/23/2017   Procedure: Left Heart Cath and Coronary Angiography;  Surgeon: Leonie Man, MD;  Location: [facility] CV LAB;;; Delayed presentation of Anterior STEMI -- p-m LAD 100% thrombotic occlusion, 30% AVG Cx. EF ~35% (anterior-anteroapical severe hypokinesis   TRANSTHORACIC ECHOCARDIOGRAM  11/2017   Anterior , septal, apical and inferior apical hypokinesis. The cavity size was moderately dilated. Wall  thickness was normal. Systolic function was severely reduced. Mild AoV calcification   Cath-PCI 01/2017:  p-mLAD 100% (thrombotic) --> PCI- SYNERGY DES 3 X 24 -- 3.1 mm; distal embolization to apical LAD.  EF ~35% anterior HK.    Immunization History  Administered Date(s) Administered   PFIZER(Purple Top)SARS-COV-2 Vaccination 12/23/2019, 01/13/2020, 09/21/2020   Pneumococcal Conjugate-13 02/02/2021   Tdap 05/13/2012   Zoster Recombinat (Shingrix) 03/16/2019, 05/20/2019    MEDICATIONS/ALLERGIES   Current Meds  Medication Sig   atorvastatin (LIPITOR) 40 MG tablet TAKE 1 TABLET BY MOUTH EVERY DAY   BRILINTA 60 MG TABS tablet TAKE 1 TABLET (60 MG TOTAL) BY MOUTH 2 (TWO) TIMES  DAILY.   carvedilol (COREG) 3.125 MG tablet TAKE 1 TABLET (3.125 MG TOTAL) BY MOUTH 2 (TWO) TIMES DAILY WITH A MEAL.   Cholecalciferol (VITAMIN D3) 50 MCG (2000 UT) capsule Take 1 capsule (2,000 Units total) by mouth daily.   ENTRESTO 49-51 MG TAKE 1 TABLET BY MOUTH TWICE A DAY   ipratropium (ATROVENT) 0.06 % nasal spray Place 2 sprays into the nose 3 (three) times daily.   nitroGLYCERIN (NITROSTAT) 0.4 MG SL tablet Place 1 tablet (0.4 mg total) under the tongue every 5 (five) minutes as needed.   triamcinolone cream (KENALOG) 0.5 % Apply 1 application topically 3 (three) times daily.    No Known Allergies  SOCIAL HISTORY/FAMILY HISTORY   Reviewed in Epic:  Pertinent findings:  Social History   Tobacco Use   Smoking status: Former    Packs/day: 0.07    Types: Cigarettes    Quit date: 03/31/2017    Years since quitting: 4.2   Smokeless tobacco: Never  Vaping Use   Vaping Use: Never used  Substance Use Topics   Alcohol use: No   Drug use: No   Social History   Social History Narrative   Very active. 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 He also walks daily after work for at least 30-45 minutes. On weekends he tries to walk at least an hour. This is usually a fast walk or slow jog.      He quit smoking shortly after his MI.    OBJCTIVE -PE, EKG, labs   Wt Readings from Last 3 Encounters:  06/12/21  153 lb 3.2 oz (69.5 kg)  02/02/21 154 lb (69.9 kg)  05/09/20 158 lb 6.4 oz (71.8 kg)    Physical Exam: BP 112/70 (BP Location: Right Arm)   Pulse 65   Ht '5\' 7"'$  (1.702 m)   Wt 153 lb 3.2 oz (69.5 kg)   SpO2 99%   BMI 23.99 kg/m  Physical Exam Vitals reviewed.  Constitutional:      General: He is not in acute distress.    Appearance: Normal appearance. He is normal weight. He is not ill-appearing or toxic-appearing.  HENT:     Head: Normocephalic and atraumatic.  Neck:     Vascular: No carotid bruit, hepatojugular reflux or JVD.  Cardiovascular:     Rate and Rhythm: Normal rate and regular rhythm. No extrasystoles are present.    Chest Wall: PMI is not displaced.     Pulses: Normal pulses.     Heart sounds: S1 normal and S2 normal. Murmur (High-pitched C-D SEM @ RUSB - neck) heard.    No gallop. No S4 sounds.  Pulmonary:     Effort: Pulmonary effort is normal. No respiratory distress.     Breath sounds: Normal breath sounds. No wheezing or rhonchi.  Chest:     Chest wall: No tenderness.  Abdominal:     General: Abdomen is flat. Bowel sounds are normal. There is no distension.     Palpations: Abdomen is soft. There is no mass (No HSM).     Tenderness: There is no abdominal tenderness.  Musculoskeletal:        General: No swelling. Normal range of motion.     Cervical back: Normal range of motion and neck supple.  Skin:    General: Skin is warm and dry.  Neurological:     General: No focal deficit present.     Mental Status: He is alert and oriented to person, place, and  time. Mental status is at baseline.     Cranial Nerves: No cranial nerve deficit.     Gait: Gait normal.  Psychiatric:        Mood and Affect: Mood normal.        Behavior: Behavior normal.        Thought Content: Thought content normal.        Judgment: Judgment normal.    Adult ECG Report 65 bpm.  RBBB.  Anterior MI, age-indeterminate.  Lateral T wave versions-suggest ischemia.  Otherwise normal  axis, intervals and durations.  Stable  Recent Labs:    Lab Results  Component Value Date   CHOL 115 05/23/2021   HDL 39.30 05/23/2021   LDLCALC 60 05/23/2021   TRIG 76.0 05/23/2021   CHOLHDL 3 05/23/2021   Lab Results  Component Value Date   CREATININE 0.94 05/23/2021   BUN 18 05/23/2021   NA 139 05/23/2021   K 4.2 05/23/2021   CL 106 05/23/2021   CO2 23 05/23/2021   CBG 107  CBC Latest Ref Rng & Units 02/02/2021 01/27/2020 03/17/2019  WBC 4.0 - 10.5 K/uL 6.2 6.9 9.3  Hemoglobin 13.0 - 17.0 g/dL 13.8 13.8 14.2  Hematocrit 39.0 - 52.0 % 40.6 41.3 42.7  Platelets 150.0 - 400.0 K/uL 215.0 246.0 222.0    Lab Results  Component Value Date   HGBA1C 5.8 05/23/2021   Lab Results  Component Value Date   TSH 0.65 02/02/2021    ==================================================  COVID-19 Education: The signs and symptoms of COVID-19 were discussed with the patient and how to seek care for testing (follow up with PCP or arrange E-visit).    I spent a total of 45 minutes with the patient spent in direct patient consultation.  Additional time spent with chart review  / charting (studies, outside notes, etc): 16 min Total Time: 61 min  Current medicines are reviewed at length with the patient today.  (+/- concerns) n/a  This visit occurred during the SARS-CoV-2 public health emergency.  Safety protocols were in place, including screening questions prior to the visit, additional usage of staff PPE, and extensive cleaning of exam room while observing appropriate contact time as indicated for disinfecting solutions.  Notice: This dictation was prepared with Dragon dictation along with smaller phrase technology. Any transcriptional errors that result from this process are unintentional and may not be corrected upon review.  Patient Instructions / Medication Changes & Studies & Tests Ordered   Patient Instructions  Medication Instructions:  Your physician has recommended you make  the following change in your medication:  - DECREASE LIPITOR to 20 mg tablet (half tablet) by mouth ONCE daily - for 30 days and reevaluate after primary does lab work in 3 months    *If you need a refill on your cardiac medications before your next appointment, please call your pharmacy*   Lab Work: Followed by Primary Care   If you have labs (blood work) drawn today and your tests are completely normal, you will receive your results only by: [place] (if you have MyChart) OR A paper copy in the mail If you have any lab test that is abnormal or we need to change your treatment, we will call you to review the results.   Testing/Procedures: none   Follow-Up: At [facility], you and your health needs are our priority.  As part of our continuing mission to provide you with exceptional heart care, we have created designated Provider Care Teams.  These  Care Teams include your primary Cardiologist (physician) and Advanced Practice Providers (APPs -  Physician Assistants and Nurse Practitioners) who all work together to provide you with the care you need, when you need it.  We recommend signing up for the patient portal called "MyChart".  Sign up information is provided on this After Visit Summary.  MyChart is used to connect with patients for Virtual Visits (Telemedicine).  Patients are able to view lab/test results, encounter notes, upcoming appointments, etc.  Non-urgent messages can be sent to your provider as well.   To learn more about what you can do with MyChart, go to [email or web address].    Your next appointment:   12 month(s)  The format for your next appointment:   In Person  Provider:   Glenetta Hew, MD   Other Instructions   Studies Ordered:   Orders Placed This Encounter  Procedures   EKG 12-Lead      Glenetta Hew, M.D., M.S. Interventional Cardiologist   Pager # [phone] Phone # [phone] [address] [place],  [place] [address]   Thank you for choosing Heartcare at [facility]!!

## 2021-07-02 ENCOUNTER — Encounter: Payer: Self-pay | Admitting: Cardiology

## 2021-07-02 NOTE — Assessment & Plan Note (Signed)
His labs look great with an LDL of 68 on current dose of atorvastatin, but he is concerned about the effect on his glycemic control.  There has been data suggest statins may need to higher glycemic levels, but there is a trade off between glycemic control and lipid control.  We had a long discussion about this.  Since his levels are very well controlled, plan for now is to drop to 20 mg of atorvastatin and then recheck both lipids and chemistry panel with A1c in 3 months to see if there is a change.   -> If there is no appreciable change, make sense to just go back to his standing dose of statin.   --> Otherwise, we could potentially consider converting to rosuvastatin 20 mg.  Also, if there is concern about glycemic control and lipids are stable, would then address potential of using osteopenia but especially for cardiomyopathy benefit.

## 2021-07-02 NOTE — Assessment & Plan Note (Signed)
Now 4 years out from PCI to the LAD with DES stent.  We discussed the risks and benefits of maintaining therapy with Brilinta or switching to Plavix versus aspirin alone.  Based on how severe his existing cardiomyopathy is, he would perform a secondary prevention benefit of maintaining Thienopyridine therapy.  Plan: Continue Brilinta 60 mg twice daily without aspirin.  Oka change y to hold Brilinta 5 to 7 days preop for any surgical procedures.

## 2021-07-02 NOTE — Assessment & Plan Note (Addendum)
Unfortunately, with the thrombotic LAD occlusion and distal embolization he had a large anterior infarct seen on cardiac MRI and echocardiogram.  Fortunately, despite having a severe ischemic cardiomyopathy with EF of roughly 30%, he is not having any CHF or anginal symptoms.  NYHA class I symptoms on stable medications.

## 2021-07-02 NOTE — Assessment & Plan Note (Signed)
Significant reduced EF on Echo but Only Class I Symptoms.  Plan: Continue current dose of Entresto and carvedilol.  At present, euvolemic, and no diuretic requirement.  Would like to address SGLT2 inhibitor, but with such a long discussion about statins, we did not get into that.-He is very suspicious of medications

## 2021-07-02 NOTE — Assessment & Plan Note (Signed)
NYHA Class I CHF symptoms.  On stable regimen with no PND, orthopnea or edema.  Euvolemic on exam.  No diuretic requirement.  Plan:  Continue current dose of Entresto 49 and 51 mg twice daily along with carvedilol 3.125 mg twice daily.  If there is concern about possible diabetes in the future, we can consider SGLT2 inhibitors (with all the discussions about statins and glycemic control etc., I chose to not delve into this.

## 2021-07-02 NOTE — Assessment & Plan Note (Signed)
Now more than 4 years out from his large anterior STEMI with LAD PCI.  Good angiographic success, but likely late presentation would be amount of thrombus noted and distal embolization led to severely reduced EF and scar on echo/cardiac MRI.  Marland Kitchen  Really was not that much residual CAD noted.  Plan:  Continue current dose of carvedilol and Entresto.  Continue statin, but we will adjust as noted-reduce to 20 mg daily  As per previous discussions, will continue maintenance dose Brilinta 60 mg twice daily without aspirin.  Okay to hold Brilinta 5 to 7 days preop for surgical procedure.

## 2021-07-28 ENCOUNTER — Other Ambulatory Visit: Payer: Self-pay | Admitting: Cardiology

## 2021-08-08 ENCOUNTER — Other Ambulatory Visit: Payer: Self-pay | Admitting: Internal Medicine

## 2021-08-08 DIAGNOSIS — R739 Hyperglycemia, unspecified: Secondary | ICD-10-CM

## 2021-08-08 DIAGNOSIS — E785 Hyperlipidemia, unspecified: Secondary | ICD-10-CM

## 2021-08-10 ENCOUNTER — Other Ambulatory Visit: Payer: Self-pay | Admitting: Cardiology

## 2021-08-21 ENCOUNTER — Other Ambulatory Visit (INDEPENDENT_AMBULATORY_CARE_PROVIDER_SITE_OTHER): Payer: 59

## 2021-08-21 DIAGNOSIS — E785 Hyperlipidemia, unspecified: Secondary | ICD-10-CM | POA: Diagnosis not present

## 2021-08-21 DIAGNOSIS — R739 Hyperglycemia, unspecified: Secondary | ICD-10-CM

## 2021-08-21 LAB — LIPID PANEL
Cholesterol: 119 mg/dL (ref 0–200)
HDL: 44 mg/dL (ref >39.00)
LDL Cholesterol: 63 mg/dL (ref 0–99)
NonHDL: 74.86
Total CHOL/HDL Ratio: 3
Triglycerides: 57 mg/dL (ref 0.0–149.0)
VLDL: 11.4 mg/dL (ref 0.0–40.0)

## 2021-08-21 LAB — COMPREHENSIVE METABOLIC PANEL
ALT: 13 U/L (ref 0–53)
AST: 20 U/L (ref 0–37)
Albumin: 4.3 g/dL (ref 3.5–5.2)
Alkaline Phosphatase: 84 U/L (ref 39–117)
BUN: 15 mg/dL (ref 6–23)
CO2: 26 mEq/L (ref 19–32)
Calcium: 9 mg/dL (ref 8.4–10.5)
Chloride: 104 mEq/L (ref 96–112)
Creatinine, Ser: 0.94 mg/dL (ref 0.40–1.50)
GFR: 86.55 mL/min (ref >60.00)
Glucose, Bld: 103 mg/dL — ABNORMAL HIGH (ref 70–99)
Potassium: 4.5 mEq/L (ref 3.5–5.1)
Sodium: 137 mEq/L (ref 135–145)
Total Bilirubin: 0.8 mg/dL (ref 0.2–1.2)
Total Protein: 6.6 g/dL (ref 6.0–8.3)

## 2021-08-21 LAB — HEMOGLOBIN A1C: Hgb A1c MFr Bld: 5.9 % (ref 4.6–6.5)

## 2021-10-21 ENCOUNTER — Other Ambulatory Visit: Payer: Self-pay | Admitting: Cardiology

## 2022-01-05 ENCOUNTER — Other Ambulatory Visit: Payer: Self-pay | Admitting: Cardiology

## 2022-02-05 ENCOUNTER — Encounter: Payer: Self-pay | Admitting: Internal Medicine

## 2022-02-05 ENCOUNTER — Ambulatory Visit (INDEPENDENT_AMBULATORY_CARE_PROVIDER_SITE_OTHER): Payer: 59

## 2022-02-05 ENCOUNTER — Ambulatory Visit (INDEPENDENT_AMBULATORY_CARE_PROVIDER_SITE_OTHER): Payer: 59 | Admitting: Internal Medicine

## 2022-02-05 VITALS — BP 92/60 | HR 60 | Temp 98.2°F | Ht 67.0 in | Wt 154.0 lb

## 2022-02-05 DIAGNOSIS — R058 Other specified cough: Secondary | ICD-10-CM | POA: Diagnosis not present

## 2022-02-05 DIAGNOSIS — R739 Hyperglycemia, unspecified: Secondary | ICD-10-CM | POA: Diagnosis not present

## 2022-02-05 DIAGNOSIS — E041 Nontoxic single thyroid nodule: Secondary | ICD-10-CM

## 2022-02-05 DIAGNOSIS — Z23 Encounter for immunization: Secondary | ICD-10-CM

## 2022-02-05 DIAGNOSIS — D320 Benign neoplasm of cerebral meninges: Secondary | ICD-10-CM | POA: Diagnosis not present

## 2022-02-05 DIAGNOSIS — Z0000 Encounter for general adult medical examination without abnormal findings: Secondary | ICD-10-CM

## 2022-02-05 LAB — CBC WITH DIFFERENTIAL/PLATELET
Basophils Absolute: 0 10*3/uL (ref 0.0–0.1)
Basophils Relative: 0.7 % (ref 0.0–3.0)
Eosinophils Absolute: 0.3 10*3/uL (ref 0.0–0.7)
Eosinophils Relative: 5.1 % — ABNORMAL HIGH (ref 0.0–5.0)
HCT: 42.4 % (ref 39.0–52.0)
Hemoglobin: 14.2 g/dL (ref 13.0–17.0)
Lymphocytes Relative: 20.4 % (ref 12.0–46.0)
Lymphs Abs: 1.4 10*3/uL (ref 0.7–4.0)
MCHC: 33.4 g/dL (ref 30.0–36.0)
MCV: 87.3 fl (ref 78.0–100.0)
Monocytes Absolute: 0.4 10*3/uL (ref 0.1–1.0)
Monocytes Relative: 6.4 % (ref 3.0–12.0)
Neutro Abs: 4.6 10*3/uL (ref 1.4–7.7)
Neutrophils Relative %: 67.4 % (ref 43.0–77.0)
Platelets: 244 10*3/uL (ref 150.0–400.0)
RBC: 4.86 Mil/uL (ref 4.22–5.81)
RDW: 13.9 % (ref 11.5–15.5)
WBC: 6.8 10*3/uL (ref 4.0–10.5)

## 2022-02-05 LAB — LIPID PANEL
Cholesterol: 136 mg/dL (ref 0–200)
HDL: 46.1 mg/dL (ref >39.00)
LDL Cholesterol: 78 mg/dL (ref 0–99)
NonHDL: 89.7
Total CHOL/HDL Ratio: 3
Triglycerides: 61 mg/dL (ref 0.0–149.0)
VLDL: 12.2 mg/dL (ref 0.0–40.0)

## 2022-02-05 LAB — URINALYSIS
Bilirubin Urine: NEGATIVE
Ketones, ur: NEGATIVE
Leukocytes,Ua: NEGATIVE
Nitrite: NEGATIVE
Specific Gravity, Urine: 1.015 (ref 1.000–1.030)
Total Protein, Urine: NEGATIVE
Urine Glucose: NEGATIVE
Urobilinogen, UA: 0.2 (ref 0.0–1.0)
pH: 6 (ref 5.0–8.0)

## 2022-02-05 LAB — COMPREHENSIVE METABOLIC PANEL
ALT: 15 U/L (ref 0–53)
AST: 21 U/L (ref 0–37)
Albumin: 4.5 g/dL (ref 3.5–5.2)
Alkaline Phosphatase: 82 U/L (ref 39–117)
BUN: 20 mg/dL (ref 6–23)
CO2: 27 mEq/L (ref 19–32)
Calcium: 9.1 mg/dL (ref 8.4–10.5)
Chloride: 105 mEq/L (ref 96–112)
Creatinine, Ser: 0.97 mg/dL (ref 0.40–1.50)
GFR: 83.08 mL/min (ref >60.00)
Glucose, Bld: 112 mg/dL — ABNORMAL HIGH (ref 70–99)
Potassium: 5 mEq/L (ref 3.5–5.1)
Sodium: 139 mEq/L (ref 135–145)
Total Bilirubin: 0.8 mg/dL (ref 0.2–1.2)
Total Protein: 6.9 g/dL (ref 6.0–8.3)

## 2022-02-05 LAB — HEMOGLOBIN A1C: Hgb A1c MFr Bld: 6 % (ref 4.6–6.5)

## 2022-02-05 LAB — PSA: PSA: 0.76 ng/mL (ref 0.10–4.00)

## 2022-02-05 LAB — TSH: TSH: 0.63 u[IU]/mL (ref 0.35–5.50)

## 2022-02-05 NOTE — Addendum Note (Signed)
Addended by: Marijean Heath R on: 02/05/2022 08:58 AM ? ? Modules accepted: Orders ? ?

## 2022-02-05 NOTE — Assessment & Plan Note (Signed)
?  We discussed age appropriate health related issues, including available/recomended screening tests and vaccinations. Labs were ordered to be later reviewed . All questions were answered. We discussed one or more of the following - seat belt use, use of sunscreen/sun exposure exercise, safe sex, fall risk reduction, second hand smoke exposure, firearm use and storage, seat belt use, a need for adhering to healthy diet and exercise. ?Labs were ordered.  All questions were answered. ?Declined colonoscopy ?Opth q 24 mo ?Shingrix advised ?Cologuard (colonoscopy declined) 2017, 2020 - due now ?

## 2022-02-05 NOTE — Assessment & Plan Note (Signed)
No f/u was recommended by Radiology ?

## 2022-02-05 NOTE — Progress Notes (Addendum)
? ?Subjective:  ?Patient ID: Blake [name], male    DOB: October 31, 1955  Age: 65 y.o. MRN: [id number] ? ?CC: [name] chief complaint on file. ? ? ?HPI ?Blake [name] presents for a well exam ?Occ cough ? ? ? ?Outpatient Medications Prior to Visit  ?Medication Sig Dispense Refill  ? atorvastatin (LIPITOR) 40 MG tablet TAKE 1 TABLET BY MOUTH EVERY DAY 90 tablet 0  ? carvedilol (COREG) 3.125 MG tablet TAKE 1 TABLET BY MOUTH TWICE A DAY WITH MEALS 180 tablet 0  ? Cholecalciferol (VITAMIN D3) 50 MCG (2000 UT) capsule Take 1 capsule (2,000 Units total) by mouth daily. 100 capsule 3  ? ENTRESTO 49-51 MG TAKE 1 TABLET BY MOUTH TWICE A DAY 180 tablet 0  ? nitroGLYCERIN (NITROSTAT) 0.4 MG SL tablet Place 1 tablet (0.4 mg total) under the tongue every 5 (five) minutes as needed. 25 tablet 3  ? ticagrelor (BRILINTA) 60 MG TABS tablet TAKE 1 TABLET BY MOUTH 2 TIMES DAILY. 180 tablet 2  ? ipratropium (ATROVENT) 0.06 % nasal spray Place 2 sprays into the nose 3 (three) times daily. 15 mL 5  ? ?[name] facility-administered medications prior to visit.  ? ? ?ROS: ?Review of Systems  ?Constitutional:  Negative for appetite change, fatigue and unexpected weight change.  ?HENT:  Negative for congestion, nosebleeds, sneezing, sore throat and trouble swallowing.   ?Eyes:  Negative for itching and visual disturbance.  ?Respiratory:  Negative for cough.   ?Cardiovascular:  Negative for chest pain, palpitations and leg swelling.  ?Gastrointestinal:  Negative for abdominal distention, blood in stool, diarrhea and nausea.  ?Genitourinary:  Negative for frequency and hematuria.  ?Musculoskeletal:  Negative for back pain, gait problem, joint swelling and neck pain.  ?Skin:  Negative for rash.  ?Neurological:  Negative for dizziness, tremors, speech difficulty and weakness.  ?Psychiatric/Behavioral:  Negative for agitation, dysphoric mood and sleep disturbance. The patient is not nervous/anxious.   ? ?Objective:  ?BP 92/60 (BP Location: Left Arm, Patient  Position: Sitting, Cuff Size: Normal)   Pulse 60   Temp 98.2 ?F (36.8 ?C) (Oral)   Ht '5\' 7"'$  (1.702 m)   Wt 154 lb (69.9 kg)   SpO2 96%   BMI 24.12 kg/m?  ? ?BP Readings from Last 3 Encounters:  ?02/05/22 92/60  ?06/12/21 112/70  ?02/02/21 100/62  ? ? ?Wt Readings from Last 3 Encounters:  ?02/05/22 154 lb (69.9 kg)  ?06/12/21 153 lb 3.2 oz (69.5 kg)  ?02/02/21 154 lb (69.9 kg)  ? ? ?Physical Exam ?Constitutional:   ?   General: He is not in acute distress. ?   Appearance: Normal appearance. He is well-developed.  ?   Comments: NAD  ?Eyes:  ?   Conjunctiva/sclera: Conjunctivae normal.  ?   Pupils: Pupils are equal, round, and reactive to light.  ?Neck:  ?   Thyroid: [name] thyromegaly.  ?   Vascular: [name] JVD.  ?Cardiovascular:  ?   Rate and Rhythm: Normal rate and regular rhythm.  ?   Heart sounds: Normal heart sounds. [name] murmur heard. ?  [name] friction rub. [name] gallop.  ?Pulmonary:  ?   Effort: Pulmonary effort is normal. [name] respiratory distress.  ?   Breath sounds: Normal breath sounds. [name] wheezing or rales.  ?Chest:  ?   Chest wall: [name] tenderness.  ?Abdominal:  ?   General: Bowel sounds are normal. There is [name] distension.  ?   Palpations: Abdomen is soft. There is [name] mass.  ?   Tenderness:  There is [name] abdominal tenderness. There is [name] guarding or rebound.  ?Musculoskeletal:     ?   General: [name] tenderness. Normal range of motion.  ?   Cervical back: Normal range of motion.  ?Lymphadenopathy:  ?   Cervical: [name] cervical adenopathy.  ?Skin: ?   General: Skin is warm and dry.  ?   Findings: [name] rash.  ?Neurological:  ?   Mental Status: He is alert and oriented to person, place, and time.  ?   Cranial Nerves: [name] cranial nerve deficit.  ?   Motor: [name] abnormal muscle tone.  ?   Coordination: Coordination normal.  ?   Gait: Gait normal.  ?   Deep Tendon Reflexes: Reflexes are normal and symmetric.  ?Psychiatric:     ?   Behavior: Behavior normal.     ?   Thought Content: Thought content normal.     ?   Judgment: Judgment  normal.  ?Pt declined rectal exam ? ?Lab Results  ?Component Value Date  ? WBC 6.2 02/02/2021  ? HGB 13.8 02/02/2021  ? HCT 40.6 02/02/2021  ? PLT 215.0 02/02/2021  ? GLUCOSE 103 (H) 08/21/2021  ? CHOL 119 08/21/2021  ? TRIG 57.0 08/21/2021  ? HDL 44.00 08/21/2021  ? LDLDIRECT 161.0 05/14/2012  ? [place] 63 08/21/2021  ? ALT 13 08/21/2021  ? AST 20 08/21/2021  ? NA 137 08/21/2021  ? K 4.5 08/21/2021  ? CL 104 08/21/2021  ? CREATININE 0.94 08/21/2021  ? BUN 15 08/21/2021  ? CO2 26 08/21/2021  ? TSH 0.65 02/02/2021  ? PSA 0.84 02/02/2021  ? INR 0.9 07/07/2008  ? HGBA1C 5.9 08/21/2021  ? ? ?US THYROID ? ?Result Date: 03/09/2021 ?CLINICAL DATA:  Prior ultrasound follow-up. EXAM: THYROID ULTRASOUND TECHNIQUE: Ultrasound examination of the thyroid gland and adjacent soft tissues was performed. COMPARISON:  04/07/2019 FINDINGS: Parenchymal Echotexture: Mildly heterogenous Isthmus: 0.6 cm, previously 0.5 cm Right lobe: 5.1 x 2.2 x 2.8 cm, previously 6.0 x 2.4 x 2.6 cm Left lobe: 6.0 x 2.2 x 2.3 cm, previously 6.3 x 2.4 x 2.3 cm _________________________________________________________ Estimated total number of nodules >/= 1 cm: 4 Number of spongiform nodules >/=  2 cm not described below (TR1): 0 Number of mixed cystic and solid nodules >/= 1.5 cm not described below (TR2): 0 _________________________________________________________ Unchanged scattered multifocal 1 cm or less mixed solid cystic, spongiform, and cystic nodules throughout the right thyroid. These each continue to appear benign and do not warrant additional ultrasound follow-up or tissue sampling. Nodule # 6 (previously 3): Prior biopsy: [name] Location: Left; Superior Maximum size: 1.3 cm; Other 2 dimensions: 1.2 x 0.8 cm, previously, 1.0 x 0.7 x 0.6 cm Composition: solid/almost completely solid (2) Echogenicity: hypoechoic (2) Shape: not taller-than-wide (0) Margins: smooth (0) Echogenic foci: none (0) ACR TI-RADS total points: 4. ACR TI-RADS risk category:  TR4  (4-6 points). Significant change in size (>/= 20% in two dimensions and minimal increase of 2 mm): Yes Change in features: [name] Change in ACR TI-RADS risk category: [name] ACR TI-RADS recommendations: *Given size (>/= 1 - 1.4 cm) and appearance, a follow-up ultrasound in 1 year should be considered based on TI-RADS criteria. _________________________________________________________ Additional scattered isoechoic mixed cystic solid nodules in the left mid inferior thyroid appear similar since 2017 and do not warrant additional ultrasound follow-up or tissue sampling. IMPRESSION: 1. Similar appearing multinodular goiter. 2. There has been minimal waxing and waning of size and internal characteristics of previously visualized scattered bilateral  thyroid nodules. There is slight interval enlargement of the left superior nodule (labeled 6, previously labeled 3, 1.3 cm, previously 1.0 cm), however this is significantly decreased in size since 2017 at which time this nodule was 1.9 cm. These nodules overall appear stable since 2017, and entered thus declared benign. [name] dedicated ultrasound follow-up or tissue sampling is recommended. The above is in keeping with the ACR TI-RADS recommendations - J Am Coll Radiol 2017;14:587-595. Ruthann Cancer, MD [facility] Electronically Signed   By: Ruthann Cancer MD   On: 03/09/2021 15:42  ? ? ?Assessment & Plan:  ? ?Problem List Items Addressed This Visit   ? ? Well adult exam  ?   ?We discussed age appropriate health related issues, including available/recomended screening tests and vaccinations. Labs were ordered to be later reviewed . All questions were answered. We discussed one or more of the following - seat belt use, use of sunscreen/sun exposure exercise, safe sex, fall risk reduction, second hand smoke exposure, firearm use and storage, seat belt use, a need for adhering to healthy diet and exercise. ?Labs were ordered.  All  questions were answered. ?Declined colonoscopy ?Opth q 24 mo ?Shingrix advised ?Cologuard (colonoscopy declined) 2017, 2020 - due now ?  ?  ? Relevant Orders  ? TSH  ? Urinalysis  ? CBC with Differential/Platelet  ? Nicoletta Dress

## 2022-02-05 NOTE — Assessment & Plan Note (Signed)
F/u at [facility] in 11/2022 ?

## 2022-02-06 ENCOUNTER — Encounter: Payer: Self-pay | Admitting: Internal Medicine

## 2022-02-08 NOTE — Telephone Encounter (Signed)
Dated and faxed back to insurance.Marland KitchenJohny [name] ?

## 2022-03-18 ENCOUNTER — Other Ambulatory Visit: Payer: Self-pay | Admitting: Cardiology

## 2022-04-12 ENCOUNTER — Other Ambulatory Visit: Payer: Self-pay | Admitting: Cardiology

## 2022-05-27 ENCOUNTER — Encounter: Payer: Self-pay | Admitting: Internal Medicine

## 2022-05-27 DIAGNOSIS — Z1211 Encounter for screening for malignant neoplasm of colon: Secondary | ICD-10-CM

## 2022-05-30 ENCOUNTER — Other Ambulatory Visit: Payer: Self-pay | Admitting: Cardiology

## 2022-05-30 NOTE — Telephone Encounter (Signed)
Please advise on labs..cologuard entered.Marland KitchenJohny [name]

## 2022-06-10 LAB — COLOGUARD: COLOGUARD: NEGATIVE

## 2022-06-11 ENCOUNTER — Other Ambulatory Visit: Payer: Self-pay | Admitting: Internal Medicine

## 2022-06-11 DIAGNOSIS — R739 Hyperglycemia, unspecified: Secondary | ICD-10-CM

## 2022-07-03 ENCOUNTER — Encounter: Payer: Self-pay | Admitting: Cardiology

## 2022-07-03 ENCOUNTER — Ambulatory Visit: Payer: 59 | Attending: Cardiology | Admitting: Cardiology

## 2022-07-03 VITALS — BP 98/60 | HR 62 | Ht 67.0 in | Wt 152.6 lb

## 2022-07-03 DIAGNOSIS — E785 Hyperlipidemia, unspecified: Secondary | ICD-10-CM

## 2022-07-03 DIAGNOSIS — Z87891 Personal history of nicotine dependence: Secondary | ICD-10-CM

## 2022-07-03 DIAGNOSIS — I2510 Atherosclerotic heart disease of native coronary artery without angina pectoris: Secondary | ICD-10-CM | POA: Diagnosis not present

## 2022-07-03 DIAGNOSIS — I255 Ischemic cardiomyopathy: Secondary | ICD-10-CM | POA: Diagnosis not present

## 2022-07-03 DIAGNOSIS — I213 ST elevation (STEMI) myocardial infarction of unspecified site: Secondary | ICD-10-CM

## 2022-07-03 DIAGNOSIS — I5042 Chronic combined systolic (congestive) and diastolic (congestive) heart failure: Secondary | ICD-10-CM | POA: Diagnosis not present

## 2022-07-03 DIAGNOSIS — I952 Hypotension due to drugs: Secondary | ICD-10-CM

## 2022-07-03 DIAGNOSIS — Z955 Presence of coronary angioplasty implant and graft: Secondary | ICD-10-CM

## 2022-07-03 MED ORDER — NITROGLYCERIN 0.4 MG SL SUBL: 0.4000 mg | SUBLINGUAL_TABLET | SUBLINGUAL | 3 refills | Status: DC | PRN

## 2022-07-03 NOTE — Progress Notes (Signed)
Primary Care Provider: Cassandria Anger, MD [place] Cardiologist: Glenetta Hew, MD Electrophysiologist: Constance Haw, MD  Clinic Note: Chief Complaint  Patient presents with   Annual follow-up    Relatively asymptomatic regarding standpoint   ===================================  ASSESSMENT/PLAN   Problem List Items Addressed This Visit       Cardiology Problems   Chronic combined systolic and diastolic heart failure, NYHA class 1 (HCC) (Chronic)    Significantly reduced EF on echo with only class I symptoms of CHF.  He is on Entresto moderate dose along with low-dose carvedilol.  Has not required any diuretic as such, but will be provided as needed.  No diuretic.  Continue to monitor      Relevant Medications   nitroGLYCERIN (NITROSTAT) 0.4 MG SL tablet   Coronary artery disease involving native coronary artery of native heart without angina pectoris (Chronic)    Almost 6 full years out from his MI.  Has not really had any significant angina or for that matter much in the way of CHF symptoms since his discharge.  No other significant collateral disease. Lipids borderline controlled and LDL of 78 back again.  This could be just related reversal with likely abnormal findings not truly due to anatomy.      Relevant Medications   nitroGLYCERIN (NITROSTAT) 0.4 MG SL tablet   Other Relevant Orders   EKG 12-Lead (Completed)   Ischemic cardiomyopathy (Chronic)    Hopefully will not be seen.      Relevant Medications   nitroGLYCERIN (NITROSTAT) 0.4 MG SL tablet   Other Relevant Orders   EKG 12-Lead (Completed)   ST elevation myocardial infarction (STEMI), subsequent episode of care ([place]) - Primary (Chronic)    Distant history of MI with Delayed presentation in the setting of proximal LAD lesion with downstream embolization.  Subsequently developed ischemic cardiomyopathy. Has not had recurrent angina or significant dyspnea since.      Relevant  Medications   nitroGLYCERIN (NITROSTAT) 0.4 MG SL tablet   Other Relevant Orders   EKG 12-Lead (Completed)   Hypotension due to drugs    Despite having ischemic cardiomyopathy, he has been unable to tolerate this but any further titration beyond his current dose of carvedilol and Entresto.  We have not yet added spironolactone.  We have also not added SGLT2 inhibitor/GLP-1 agonist-      Relevant Medications   nitroGLYCERIN (NITROSTAT) 0.4 MG SL tablet   Hyperlipidemia LDL goal <70 (Chronic)    Last labs showed LDL less than 80.  Seems to be doing okay with no real active memory issues.  We have talked about potentially switching to rosuvastatin but as he is doing fine now we will stay on target.      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  No real bleeding issues.  Okay to hold Brilinta 5 to 7 days preop for surgeries or procedures and restart when safe postop.      Former cigarette smoker - Quit July 2018 (Chronic)    He worked himself up to tell me about how well he is done with smoking cessation.  Pretty much has not gone back to cigarette since his MI.       ===================================  HPI:    Kuron Docken is a 66 y.o. male with a PMH below  who presents today for annual exam.  He continues to follow-up at the request of Plotnikov, Evie Lacks, MD.  CV History: 01/2017: Large delayed presentation anterior MI and PCI to LAD Ischemic CM-EF 25 to 30%, declined ICD; NYHA class I CHF symptoms. Borderline DM-2, HLD  Shing-Ru Miyasato was last seen on June 22, 2021 for annual follow-up doing well.  No major complaints.  I have been really focusing on his diet try to bring his A1c down further.  Was concerned about the effect of statin on his Glycemic control.  Recent Hospitalizations: None  Reviewed  CV studies:     The following studies were reviewed today: (if available, images/films reviewed: From Epic Chart or Care Everywhere) No recent studies:  Interval History:   Shing-Ru Laskowski returns here today for follow-up doing pretty well.  Relatively stable.  He has already had his trip back to [place] earlier last early this year.-Left shortly after our last visit.  He remains pretty completely asymptomatic with exception of some mild orthopnea but no chest pain, pressure or dyspnea with rest or exertion PND.  No edema.  No exertional dyspnea or chest pain or pressure.  No syncope or near syncope.  No TIA or amaurosis fugax.  No claudication.  No longer really notice any concerns for memory issues, or myalgias.  REVIEWED OF SYSTEMS   Review of Systems  Constitutional:  Negative for malaise/fatigue (Not overly energetic, but not notable fatigue) and weight loss.  HENT:  Negative for nosebleeds.   Respiratory:  Negative for shortness of breath (Only with prominent exertion.).   Cardiovascular:        Per HPI, negative  Gastrointestinal:  Negative for abdominal pain, blood in stool and melena.  Genitourinary:  Negative for hematuria.  Musculoskeletal:  Positive for joint pain. Negative for falls and myalgias (Off-and-on.  Worse when he is on his feet all day).       No muscle aches cramping or fatigue.  Neurological:  Negative for dizziness and focal weakness.  Psychiatric/Behavioral:  Negative for depression and memory loss. The patient is not nervous/anxious.     I have reviewed and (if needed) personally updated the patient's problem list, medications, allergies, past medical and surgical history, social and family history.   PAST MEDICAL HISTORY   Past Medical History:  Diagnosis Date   CAD (coronary artery disease), native coronary artery 01/2017   a. 01/2017: anterior STEMI with 100% occlusion of the prox-mid LAD with 95% distal LAD stenosis due to distal embolization of thrombus. A Synergy  DES was placed to the prox-LAD.   Cancer [facility]) 2009   brain tumor   Hyperlipemia    Ischemic cardiomyopathy    a. 01/2017: initial echo showed a reduced EF of 30-35% with repeat echo of 40-45%. -->  However follow-up echo showed EF of 30-35%, EF confirmed to be 30% by MRI October 2018.   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   TOBACCO USER 11/28/2009   Qualifier: Diagnosis of  By: Plotnikov MD, Evie Lacks    VITAMIN D DEFICIENCY 07/02/2008   Qualifier: Diagnosis of  By: Alain Marion MD, Evie Lacks     PAST SURGICAL HISTORY   Past Surgical History:  Procedure Laterality Date   CORONARY STENT INTERVENTION N/A 02/23/2017   Procedure: Coronary Stent Intervention;  Surgeon: Leonie Man, MD;  Location: [facility] INVASIVE CV LAB;; p-mLAD 100% (thrombotic) --> PCI- SYNERGY DES 3 X 24 -- 3.1 mm; distal embolization to apical LAD.   CORONARY/GRAFT ACUTE MI REVASCULARIZATION N/A 02/23/2017   Procedure: Coronary/Graft Acute MI Revascularization;  Surgeon: Leonie Man, MD;  Location: [place] CV LAB;  Service: Cardiovascular;;; PCI of 100% thrombotic p-mLAD in setting of Anterior STEMI - delayed presentation   CRANIECTOMY / [place] TUMOR  2009   surgery and XRT at [place] N/A 02/23/2017   Procedure: Left Heart Cath and Coronary Angiography;  Surgeon: Leonie Man, MD;  Location: [place] CV LAB;;;  Delayed presentation of Anterior STEMI -- p-m LAD 100% thrombotic occlusion, 30% AVG Cx. EF ~35% (anterior-anteroapical severe hypokinesis   TRANSTHORACIC ECHOCARDIOGRAM  11/2017   Anterior , septal, apical and inferior apical hypokinesis. The cavity size was moderately dilated. Wall  thickness was normal. Systolic function was severely reduced. Mild AoV calcification    Immunization History  Administered Date(s) Administered   Influenza-Unspecified 07/05/2021   PFIZER(Purple Top)SARS-COV-2 Vaccination 12/23/2019, 01/13/2020, 09/21/2020   Pfizer Covid-19 Vaccine Bivalent Booster 21yr & up 07/21/2021   Pneumococcal Conjugate-13 02/02/2021   Tdap 05/13/2012, 02/05/2022   Zoster Recombinat (Shingrix) 03/16/2019, 05/20/2019    MEDICATIONS/ALLERGIES   Current Meds  Medication Sig   atorvastatin (LIPITOR) 40 MG tablet TAKE 1 TABLET BY MOUTH EVERY DAY   carvedilol (COREG) 3.125 MG tablet TAKE 1 TABLET BY MOUTH TWICE A DAY WITH FOOD   Cholecalciferol (VITAMIN D3) 50 MCG (2000 UT) capsule Take 1 capsule (2,000 Units total) by mouth daily.   sacubitril-valsartan (ENTRESTO) 49-51 MG TAKE 1 TABLET BY MOUTH TWICE A DAY   ticagrelor (BRILINTA) 60 MG TABS tablet TAKE 1 TABLET BY MOUTH TWICE A DAY    No Known Allergies  SOCIAL HISTORY/FAMILY HISTORY   Reviewed in Epic:  Pertinent findings:  Social History   Tobacco Use   Smoking status: Former    Packs/day: 0.07    Types: Cigarettes    Quit date: 03/31/2017    Years since quitting: 5.2   Smokeless tobacco: Never  Vaping Use   Vaping Use: Never used  Substance Use Topics   Alcohol use: No   Drug use: No   Social History   Social History Narrative   Very active. 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 On weekends he tries to walk at least an hour. This is usually a fast walk or slow jog.      He quit smoking shortly after his MI.    OBJCTIVE -PE, EKG, labs   Wt Readings  from Last 3 Encounters:  07/03/22 152 lb 9.6 oz (69.2 kg)  02/05/22 154 lb (69.9 kg)  06/12/21 153 lb 3.2 oz (69.5 kg)    Physical Exam: BP 98/60 (BP Location: Left Arm, Patient Position: Sitting, Cuff Size: Normal)   Pulse 62   Ht '5\' 7"'$  (1.702 m)  Wt 152 lb 9.6 oz (69.2 kg)   SpO2 96%   BMI 23.90 kg/m  Physical Exam Vitals reviewed.  Constitutional:      General: He is not in acute distress.    Appearance: Normal appearance. He is normal weight. He is not ill-appearing.     Comments: Healthy-appearing.  Well-groomed.  Well-nourished.  HENT:     Head: Normocephalic and atraumatic.  Neck:     Vascular: No carotid bruit.  Cardiovascular:     Rate and Rhythm: Normal rate and regular rhythm.     Pulses: Normal pulses.     Heart sounds: No murmur heard.    No gallop.  Pulmonary:     Effort: Pulmonary effort is normal.     Breath sounds: Normal breath sounds. No wheezing.  Musculoskeletal:        General: No swelling. Normal range of motion.     Cervical back: Normal range of motion and neck supple.  Skin:    General: Skin is warm.     Coloration: Skin is not jaundiced or pale.  Neurological:     General: No focal deficit present.     Mental Status: He is alert. Mental status is at baseline.  Psychiatric:        Mood and Affect: Mood normal.        Behavior: Behavior normal.        Thought Content: Thought content normal.        Judgment: Judgment normal.      Adult ECG Report  Rate: 62;  Rhythm: normal sinus rhythm and right bundle block, low voltage, anteroseptal, age-indeterminate.  T wave inversions ;   Narrative Interpretation: Stable  Recent Labs: Reviewed Lab Results  Component Value Date   CHOL 136 02/05/2022   HDL 46.10 02/05/2022   LDLCALC 78 02/05/2022   LDLDIRECT 161.0 05/14/2012   TRIG 61.0 02/05/2022   CHOLHDL 3 02/05/2022   Lab Results  Component Value Date   CREATININE 0.97 02/05/2022   BUN 20 02/05/2022   NA 139 02/05/2022   K 5.0  02/05/2022   CL 105 02/05/2022   CO2 27 02/05/2022      Latest Ref Rng & Units 02/05/2022    8:58 AM 02/02/2021    9:55 AM 01/27/2020   10:35 AM  CBC  WBC 4.0 - 10.5 K/uL 6.8  6.2  6.9   Hemoglobin 13.0 - 17.0 g/dL 14.2  13.8  13.8   Hematocrit 39.0 - 52.0 % 42.4  40.6  41.3   Platelets 150.0 - 400.0 K/uL 244.0  215.0  246.0     Lab Results  Component Value Date   HGBA1C 6.0 02/05/2022   Lab Results  Component Value Date   TSH 0.63 02/05/2022    ================================================== I spent a total of 18 minutes with the patient spent in direct patient consultation.  Additional time spent with chart review  / charting (studies, outside notes, etc): 14 min Total Time: 32 min  Current medicines are reviewed at length with the patient today.  (+/- concerns) none  Notice: This dictation was prepared with Dragon dictation along with smart phrase technology. Any transcriptional errors that result from this process are unintentional and may not be corrected upon review.  Studies Ordered:   Orders Placed This Encounter  Procedures   EKG 12-Lead   Meds ordered this encounter  Medications   nitroGLYCERIN (NITROSTAT) 0.4 MG SL tablet    Sig: Place 1 tablet (0.4 mg total) under  the tongue every 5 (five) minutes as needed.    Dispense:  25 tablet    Refill:  3    Patient Instructions / Medication Changes & Studies & Tests Ordered   Patient Instructions  Medication Instructions:  No changes  *If you need a refill on your cardiac medications before your next appointment, please call your pharmacy*   Lab Work: Not  needed    Testing/Procedures: Not needed   Follow-Up: At [facility], you and your health needs are our priority.  As part of our continuing mission to provide you with exceptional heart care, we have created designated Provider Care Teams.  These Care Teams include your primary Cardiologist (physician) and Advanced Practice Providers (APPs -   Physician Assistants and Nurse Practitioners) who all work together to provide you with the care you need, when you need it.     Your next appointment:   12 month(s)  The format for your next appointment:   In Person  Provider:   Glenetta Hew, MD         Leonie Man, MD, MS Glenetta Hew, M.D., M.S. Interventional Cardiologist  [place]  Pager # [phone] Phone # [phone] [address] [place] [address] [place], [place] [address]   Thank you for choosing [facility] [place]!!

## 2022-07-03 NOTE — Patient Instructions (Addendum)

## 2022-07-06 NOTE — Progress Notes (Incomplete)
Primary Care Provider: Cassandria Anger, MD [place] Cardiologist: Glenetta Hew, MD Electrophysiologist: Constance Haw, MD  Clinic Note: No chief complaint on file.  ===================================  ASSESSMENT/PLAN   Problem List Items Addressed This Visit       Cardiology Problems   Chronic combined systolic and diastolic heart failure, NYHA class 1 (HCC) (Chronic)   Relevant Medications   nitroGLYCERIN (NITROSTAT) 0.4 MG SL tablet   Coronary artery disease involving native coronary artery of native heart without angina pectoris (Chronic)   Relevant Medications   nitroGLYCERIN (NITROSTAT) 0.4 MG SL tablet   Other Relevant Orders   EKG 12-Lead (Completed)   Ischemic cardiomyopathy (Chronic)   Relevant Medications   nitroGLYCERIN (NITROSTAT) 0.4 MG SL tablet   Other Relevant Orders   EKG 12-Lead (Completed)   ST elevation myocardial infarction (STEMI), subsequent episode of care ([place]) - Primary (Chronic)   Relevant Medications   nitroGLYCERIN (NITROSTAT) 0.4 MG SL tablet   Other Relevant Orders   EKG 12-Lead (Completed)   Hypotension due to drugs   Relevant Medications   nitroGLYCERIN (NITROSTAT) 0.4 MG SL tablet   Hyperlipidemia LDL goal <70 (Chronic)   Relevant Medications   nitroGLYCERIN (NITROSTAT) 0.4 MG SL tablet     Other   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 male with a PMH below who presents today for annual exam.  He continues to follow-up at the request of Plotnikov, Evie Lacks, MD.  CV History: 01/2017: Large delayed presentation anterior MI and PCI to LAD Ischemic CM-EF 25 to 30%, declined ICD; NYHA class I CHF symptoms. Borderline DM-2, HLD  Blake [name] was last seen on June 22, 2021 for annual follow-up doing well.  No major complaints.  I have been really focusing on his diet try to bring his A1c down further.   Was concerned about the effect of statin on his Glycemic control.  Recent Hospitalizations: None  Reviewed  CV studies:    The following studies were reviewed today: (if available, images/films reviewed: From Epic Chart or Care Everywhere) No recent studies:  Interval History:   Blake [name] returns here today for follow-up doing pretty well.  Relatively stable.  He has already had his trip back to [place] earlier last early this year.-Left shortly after our last visit.  He remains pretty completely asymptomatic with exception of some mild orthopnea but no chest pain, pressure or dyspnea with rest or exertion PND.  No edema.  No exertional dyspnea or chest pain or pressure.  No syncope or near syncope.  No TIA or amaurosis fugax.  No claudication.  No longer really notice any concerns for memory issues, or myalgias.  REVIEWED OF SYSTEMS   Review of Systems  Constitutional:  Negative for malaise/fatigue (Not overly energetic, but not notable fatigue) and weight loss.  HENT:  Negative for nosebleeds.   Respiratory:  Negative for shortness of breath (Only with prominent exertion.).   Cardiovascular:        Per HPI, negative  Gastrointestinal:  Negative for abdominal pain, blood in stool and melena.  Genitourinary:  Negative for hematuria.  Musculoskeletal:  Positive for joint pain. Negative for falls and myalgias (Off-and-on.  Worse when he is on his feet all day).       No muscle aches cramping or fatigue.  Neurological:  Negative for dizziness and focal weakness.  Psychiatric/Behavioral:  Negative  for depression and memory loss. The patient is not nervous/anxious.     I have reviewed and (if needed) personally updated the patient's problem list, medications, allergies, past medical and surgical history, social and family history.   PAST MEDICAL HISTORY   Past Medical History:  Diagnosis Date  . CAD (coronary artery disease), native coronary artery 01/2017   a. 01/2017: anterior  STEMI with 100% occlusion of the prox-mid LAD with 95% distal LAD stenosis due to distal embolization of thrombus. A Synergy DES was placed to the prox-LAD.  [facility]land Kitchen [facility]) 2009   brain tumor  . Hyperlipemia   . Ischemic cardiomyopathy    a. 01/2017: initial echo showed a reduced EF of 30-35% with repeat echo of 40-45%. -->  However follow-up echo showed EF of 30-35%, EF confirmed to be 30% by MRI October 2018.  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 ST elevation myocardial infarction (STEMI) of anterior wall ([place]) 01/2017   Large anterior infarct.  . TOBACCO USER 11/28/2009   Qualifier: Diagnosis of  By: Alain Marion MD, Evie Lacks   . VITAMIN D DEFICIENCY 07/02/2008   Qualifier: Diagnosis of  By: Plotnikov MD, Evie Lacks     PAST SURGICAL HISTORY   Past Surgical History:  Procedure Laterality Date  . CORONARY STENT INTERVENTION N/A 02/23/2017   Procedure: Coronary Stent Intervention;  Surgeon: Leonie Man, MD;  Location: [facility] INVASIVE CV LAB;; p-mLAD 100% (thrombotic) --> PCI- SYNERGY DES 3 X 24 -- 3.1 mm; distal embolization to apical LAD.  Marland Kitchen CORONARY/GRAFT ACUTE MI REVASCULARIZATION N/A 02/23/2017   Procedure: Coronary/Graft Acute MI Revascularization;  Surgeon: Leonie Man, MD;  Location: [place] CV LAB;  Service: Cardiovascular;;; PCI of 100% thrombotic p-mLAD in setting of Anterior STEMI - delayed presentation  . CRANIECTOMY / [place] TUMOR  2009   surgery and XRT at [facility]  . LEFT HEART CATH AND CORONARY ANGIOGRAPHY  N/A 02/23/2017   Procedure: Left Heart Cath and Coronary Angiography;  Surgeon: Leonie Man, MD;  Location: [facility] CV LAB;;; Delayed presentation of Anterior STEMI -- p-m LAD 100% thrombotic occlusion, 30% AVG Cx. EF ~35% (anterior-anteroapical severe hypokinesis  . TRANSTHORACIC ECHOCARDIOGRAM  11/2017   Anterior , septal, apical and inferior apical hypokinesis. The cavity size was moderately dilated. Wall  thickness was normal. Systolic function was severely reduced. Mild AoV calcification    Immunization History  Administered Date(s) Administered  . Influenza-Unspecified 07/05/2021  . PFIZER(Purple Top)SARS-COV-2 Vaccination 12/23/2019, 01/13/2020, 09/21/2020  . Pension scheme manager 50yr & up 07/21/2021  . Pneumococcal Conjugate-13 02/02/2021  . Tdap 05/13/2012, 02/05/2022  . Zoster Recombinat (Shingrix) 03/16/2019, 05/20/2019    MEDICATIONS/ALLERGIES   Current Meds  Medication Sig  . atorvastatin (LIPITOR) 40 MG tablet TAKE 1 TABLET BY MOUTH [name] DAY  . carvedilol (COREG) 3.125 MG tablet TAKE 1 TABLET BY MOUTH TWICE A DAY WITH FOOD  . Cholecalciferol (VITAMIN D3) 50 MCG (2000 UT) capsule Take 1  capsule (2,000 Units total) by mouth daily.  . sacubitril-valsartan (ENTRESTO) 49-51 MG TAKE 1 TABLET BY MOUTH TWICE A DAY  . ticagrelor (BRILINTA) 60 MG TABS tablet TAKE 1 TABLET BY MOUTH TWICE A DAY    No Known Allergies  SOCIAL HISTORY/FAMILY HISTORY   Reviewed in Epic:  Pertinent findings:  Social History   Tobacco Use  . Smoking status: Former    Packs/day: 0.07    Types: Cigarettes    Quit date: 03/31/2017    Years since quitting: 5.2  . Smokeless tobacco: Never  Vaping Use  . Vaping Use: Never used  Substance Use Topics  . Alcohol use: No  . Drug use: No   Social History   Social History Narrative   Very active. 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 He also walks daily after work for at least 30-45 minutes. On weekends he tries to  walk at least an hour. This is usually a fast walk or slow jog.      He quit smoking shortly after his MI.    OBJCTIVE -PE, EKG, labs   Wt Readings from Last 3 Encounters:  07/03/22 152 lb 9.6 oz (69.2 kg)  02/05/22 154 lb (69.9 kg)  06/12/21 153 lb 3.2 oz (69.5 kg)    Physical Exam: BP 98/60 (BP Location: Left Arm, Patient Position: Sitting, Cuff Size: Normal)   Pulse 62   Ht '5\' 7"'$  (1.702 m)   Wt 152 lb 9.6 oz (69.2 kg)   SpO2 96%   BMI 23.90 kg/m  Physical Exam   Adult ECG Report  Rate: *** ;  Rhythm: {rhythm:17366};   Narrative Interpretation: ***  Recent Labs:  ***  Lab Results  Component Value Date   CHOL 136 02/05/2022   HDL 46.10 02/05/2022   LDLCALC 78 02/05/2022   LDLDIRECT 161.0 05/14/2012   TRIG 61.0 02/05/2022   CHOLHDL 3 02/05/2022   Lab Results  Component Value Date   CREATININE 0.97 02/05/2022   BUN 20 02/05/2022   NA 139 02/05/2022   K 5.0 02/05/2022   CL 105 02/05/2022   CO2 27 02/05/2022      Latest Ref Rng & Units 02/05/2022    8:58 AM 02/02/2021    9:55 AM 01/27/2020   10:35 AM  CBC  WBC 4.0 - 10.5 K/uL 6.8  6.2  6.9   Hemoglobin 13.0 - 17.0 g/dL 14.2  13.8  13.8   Hematocrit 39.0 - 52.0 % 42.4  40.6  41.3   Platelets 150.0 - 400.0 K/uL 244.0  215.0  246.0     Lab Results  Component Value Date   HGBA1C 6.0 02/05/2022   Lab Results  Component Value Date   TSH 0.63 02/05/2022    ================================================== I spent a total of ***minutes with the patient spent in direct patient consultation.  Additional time spent with chart review  / charting (studies, outside notes, etc): *** min Total Time: *** min  Current medicines are reviewed at length with the patient today.  (+/- concerns) ***  Notice: This dictation was prepared with Dragon dictation along with smart phrase technology. Any transcriptional errors that result from this process are unintentional and may not be corrected upon review.  Studies Ordered:    Orders Placed This Encounter  Procedures  . EKG 12-Lead   Meds ordered this encounter  Medications  . nitroGLYCERIN (NITROSTAT) 0.4 MG SL tablet    Sig: Place 1 tablet (0.4 mg total) under the tongue [name]  5 (five) minutes as needed.    Dispense:  25 tablet    Refill:  3    Patient Instructions / Medication Changes & Studies & Tests Ordered   Patient Instructions  Medication Instructions:  No changes  *If you need a refill on your cardiac medications before your next appointment, please call your pharmacy*   Lab Work: Not  needed    Testing/Procedures: Not needed   Follow-Up: At [facility], you and your health needs are our priority.  As part of our continuing mission to provide you with exceptional heart care, we have created designated Provider Care Teams.  These Care Teams include your primary Cardiologist (physician) and Advanced Practice Providers (APPs -  Physician Assistants and Nurse Practitioners) who all work together to provide you with the care you need, when you need it.     Your next appointment:   12 month(s)  The format for your next appointment:   In Person  Provider:   Glenetta Hew, MD         Leonie Man, MD, MS Glenetta Hew, M.D., M.S. Interventional Cardiologist  [place]  Pager # [phone] Phone # [phone] [address] [place], [place] [address]   Thank you for choosing [facility] [place]!!

## 2022-07-07 ENCOUNTER — Encounter: Payer: Self-pay | Admitting: Cardiology

## 2022-07-07 NOTE — Assessment & Plan Note (Addendum)
Significantly reduced EF on echo with only class I symptoms of CHF.  He is on Entresto moderate dose along with low-dose carvedilol.  Has not required any diuretic as such, but will be provided as needed.  No diuretic.  Continue to monitor

## 2022-07-07 NOTE — Assessment & Plan Note (Signed)
Last labs showed LDL less than 80.  Seems to be doing okay with no real active memory issues.  We have talked about potentially switching to rosuvastatin but as he is doing fine now we will stay on target.

## 2022-07-07 NOTE — Assessment & Plan Note (Signed)
Despite having ischemic cardiomyopathy, he has been unable to tolerate this but any further titration beyond his current dose of carvedilol and Entresto.  We have not yet added spironolactone.  We have also not added SGLT2 inhibitor/GLP-1 agonist-

## 2022-07-07 NOTE — Assessment & Plan Note (Signed)
Hopefully will not be seen.

## 2022-07-07 NOTE — Assessment & Plan Note (Signed)
He worked himself up to tell me about how well he is done with smoking cessation.  Pretty much has not gone back to cigarette since his MI.

## 2022-07-07 NOTE — Assessment & Plan Note (Addendum)
Almost 6 full years out from his MI.  Has not really had any significant angina or for that matter much in the way of CHF symptoms since his discharge.  No other significant collateral disease. Lipids borderline controlled and LDL of 78 back again.  This could be just related reversal with likely abnormal findings not truly due to anatomy.

## 2022-07-07 NOTE — Assessment & Plan Note (Addendum)
Remains on maintenance dose Brilinta monotherapy.  No real bleeding issues.   Okay to hold Brilinta 5 to 7 days preop for surgeries or procedures and restart when safe postop.

## 2022-07-07 NOTE — Assessment & Plan Note (Signed)
Distant history of MI with Delayed presentation in the setting of proximal LAD lesion with downstream embolization.  Subsequently developed ischemic cardiomyopathy. Has not had recurrent angina or significant dyspnea since.

## 2022-08-07 ENCOUNTER — Other Ambulatory Visit (INDEPENDENT_AMBULATORY_CARE_PROVIDER_SITE_OTHER): Payer: 59

## 2022-08-07 DIAGNOSIS — R739 Hyperglycemia, unspecified: Secondary | ICD-10-CM

## 2022-08-07 LAB — COMPREHENSIVE METABOLIC PANEL
ALT: 15 U/L (ref 0–53)
AST: 22 U/L (ref 0–37)
Albumin: 4.1 g/dL (ref 3.5–5.2)
Alkaline Phosphatase: 82 U/L (ref 39–117)
BUN: 18 mg/dL (ref 6–23)
CO2: 25 mEq/L (ref 19–32)
Calcium: 9 mg/dL (ref 8.4–10.5)
Chloride: 106 mEq/L (ref 96–112)
Creatinine, Ser: 0.98 mg/dL (ref 0.40–1.50)
GFR: 81.77 mL/min (ref >60.00)
Glucose, Bld: 110 mg/dL — ABNORMAL HIGH (ref 70–99)
Potassium: 4.6 mEq/L (ref 3.5–5.1)
Sodium: 138 mEq/L (ref 135–145)
Total Bilirubin: 0.6 mg/dL (ref 0.2–1.2)
Total Protein: 6.5 g/dL (ref 6.0–8.3)

## 2022-08-07 LAB — HEMOGLOBIN A1C: Hgb A1c MFr Bld: 6.1 % (ref 4.6–6.5)

## 2022-08-13 ENCOUNTER — Ambulatory Visit: Payer: 59 | Admitting: Internal Medicine

## 2022-08-13 ENCOUNTER — Encounter: Payer: Self-pay | Admitting: Internal Medicine

## 2022-08-15 ENCOUNTER — Other Ambulatory Visit: Payer: Self-pay | Admitting: Internal Medicine

## 2022-08-15 DIAGNOSIS — E785 Hyperlipidemia, unspecified: Secondary | ICD-10-CM

## 2022-08-17 ENCOUNTER — Other Ambulatory Visit (INDEPENDENT_AMBULATORY_CARE_PROVIDER_SITE_OTHER): Payer: 59

## 2022-08-17 DIAGNOSIS — E785 Hyperlipidemia, unspecified: Secondary | ICD-10-CM

## 2022-08-17 LAB — LIPID PANEL
Cholesterol: 111 mg/dL (ref 0–200)
HDL: 45.9 mg/dL (ref >39.00)
LDL Cholesterol: 54 mg/dL (ref 0–99)
NonHDL: 65.15
Total CHOL/HDL Ratio: 2
Triglycerides: 56 mg/dL (ref 0.0–149.0)
VLDL: 11.2 mg/dL (ref 0.0–40.0)

## 2022-12-30 ENCOUNTER — Other Ambulatory Visit: Payer: Self-pay | Admitting: Cardiology

## 2023-02-05 ENCOUNTER — Encounter: Payer: Self-pay | Admitting: Internal Medicine

## 2023-02-05 ENCOUNTER — Ambulatory Visit (INDEPENDENT_AMBULATORY_CARE_PROVIDER_SITE_OTHER): Payer: 59 | Admitting: Internal Medicine

## 2023-02-05 VITALS — BP 120/70 | HR 62 | Temp 98.7°F | Ht 67.0 in | Wt 160.0 lb

## 2023-02-05 DIAGNOSIS — I255 Ischemic cardiomyopathy: Secondary | ICD-10-CM

## 2023-02-05 DIAGNOSIS — E041 Nontoxic single thyroid nodule: Secondary | ICD-10-CM

## 2023-02-05 DIAGNOSIS — Z122 Encounter for screening for malignant neoplasm of respiratory organs: Secondary | ICD-10-CM

## 2023-02-05 DIAGNOSIS — R739 Hyperglycemia, unspecified: Secondary | ICD-10-CM

## 2023-02-05 DIAGNOSIS — Z0000 Encounter for general adult medical examination without abnormal findings: Secondary | ICD-10-CM

## 2023-02-05 LAB — COMPREHENSIVE METABOLIC PANEL
ALT: 16 U/L (ref 0–53)
AST: 24 U/L (ref 0–37)
Albumin: 4.2 g/dL (ref 3.5–5.2)
Alkaline Phosphatase: 88 U/L (ref 39–117)
BUN: 14 mg/dL (ref 6–23)
CO2: 26 mEq/L (ref 19–32)
Calcium: 9.1 mg/dL (ref 8.4–10.5)
Chloride: 107 mEq/L (ref 96–112)
Creatinine, Ser: 0.88 mg/dL (ref 0.40–1.50)
GFR: 90.87 mL/min (ref >60.00)
Glucose, Bld: 101 mg/dL — ABNORMAL HIGH (ref 70–99)
Potassium: 5.3 mEq/L — ABNORMAL HIGH (ref 3.5–5.1)
Sodium: 140 mEq/L (ref 135–145)
Total Bilirubin: 1 mg/dL (ref 0.2–1.2)
Total Protein: 6.6 g/dL (ref 6.0–8.3)

## 2023-02-05 LAB — CBC WITH DIFFERENTIAL/PLATELET
Basophils Absolute: 0 10*3/uL (ref 0.0–0.1)
Basophils Relative: 0.6 % (ref 0.0–3.0)
Eosinophils Absolute: 0.2 10*3/uL (ref 0.0–0.7)
Eosinophils Relative: 3.1 % (ref 0.0–5.0)
HCT: 42.1 % (ref 39.0–52.0)
Hemoglobin: 14 g/dL (ref 13.0–17.0)
Lymphocytes Relative: 23.2 % (ref 12.0–46.0)
Lymphs Abs: 1.5 10*3/uL (ref 0.7–4.0)
MCHC: 33.3 g/dL (ref 30.0–36.0)
MCV: 88.6 fl (ref 78.0–100.0)
Monocytes Absolute: 0.4 10*3/uL (ref 0.1–1.0)
Monocytes Relative: 5.9 % (ref 3.0–12.0)
Neutro Abs: 4.3 10*3/uL (ref 1.4–7.7)
Neutrophils Relative %: 67.2 % (ref 43.0–77.0)
Platelets: 252 10*3/uL (ref 150.0–400.0)
RBC: 4.76 Mil/uL (ref 4.22–5.81)
RDW: 13.9 % (ref 11.5–15.5)
WBC: 6.4 10*3/uL (ref 4.0–10.5)

## 2023-02-05 LAB — LIPID PANEL
Cholesterol: 110 mg/dL (ref 0–200)
HDL: 39.3 mg/dL (ref >39.00)
LDL Cholesterol: 58 mg/dL (ref 0–99)
NonHDL: 70.29
Total CHOL/HDL Ratio: 3
Triglycerides: 62 mg/dL (ref 0.0–149.0)
VLDL: 12.4 mg/dL (ref 0.0–40.0)

## 2023-02-05 LAB — URINALYSIS
Bilirubin Urine: NEGATIVE
Ketones, ur: NEGATIVE
Leukocytes,Ua: NEGATIVE
Nitrite: NEGATIVE
Specific Gravity, Urine: 1.015 (ref 1.000–1.030)
Total Protein, Urine: NEGATIVE
Urine Glucose: NEGATIVE
Urobilinogen, UA: 0.2 (ref 0.0–1.0)
pH: 6 (ref 5.0–8.0)

## 2023-02-05 LAB — PSA: PSA: 0.85 ng/mL (ref 0.10–4.00)

## 2023-02-05 LAB — TSH: TSH: 0.6 u[IU]/mL (ref 0.35–5.50)

## 2023-02-05 LAB — HEMOGLOBIN A1C: Hgb A1c MFr Bld: 6.2 % (ref 4.6–6.5)

## 2023-02-05 MED ORDER — NITROGLYCERIN 0.4 MG SL SUBL: 0.4000 mg | SUBLINGUAL_TABLET | SUBLINGUAL | 3 refills | Status: AC | PRN

## 2023-02-05 NOTE — Assessment & Plan Note (Addendum)
We discussed age appropriate health related issues, including available/recomended screening tests and vaccinations. Labs were ordered to be later reviewed . All questions were answered. We discussed one or more of the following - seat belt use, use of sunscreen/sun exposure exercise, safe sex, fall risk reduction, second hand smoke exposure, firearm use and storage, seat belt use, a need for adhering to healthy diet and exercise. Labs were ordered.  All questions were answered. Declined colonoscopy Opth q 24 mo Shingrix advised Cologuard (colonoscopy declined) 2017, 2020, 2023 Chest CT  - low dose

## 2023-02-05 NOTE — Patient Instructions (Signed)
USEFUL THINGS FOR HAND ARTHRITIS  RICE SOCK HEATING PAD: [email or web address]/Homemade-[email or web address]    SILICONE PADS:    BRIX JAR OPENER:    NITRILE COATED GARDEN GLOVES:   THUMB BRACE:     BLUE EMU CREAM:    

## 2023-02-05 NOTE — Progress Notes (Signed)
Subjective:  Patient ID: Blake [name], male    DOB: 03/09/1956  Age: 67 y.o. MRN: [id number]  CC: No chief complaint on file.   HPI [name] presents for a well exam  Outpatient Medications Prior to Visit  Medication Sig Dispense Refill   atorvastatin (LIPITOR) 40 MG tablet TAKE 1 TABLET BY MOUTH EVERY DAY 90 tablet 3   BRILINTA 60 MG TABS tablet TAKE 1 TABLET BY MOUTH TWICE A DAY 180 tablet 2   carvedilol (COREG) 3.125 MG tablet TAKE 1 TABLET BY MOUTH TWICE A DAY WITH FOOD 180 tablet 3   Cholecalciferol (VITAMIN D3) 50 MCG (2000 UT) capsule Take 1 capsule (2,000 Units total) by mouth daily. 100 capsule 3   sacubitril-valsartan (ENTRESTO) 49-51 MG TAKE 1 TABLET BY MOUTH TWICE A DAY 180 tablet 3   nitroGLYCERIN (NITROSTAT) 0.4 MG SL tablet Place 1 tablet (0.4 mg total) under the tongue every 5 (five) minutes as needed. 25 tablet 3   No facility-administered medications prior to visit.    ROS: Review of Systems  Constitutional:  Negative for appetite change, fatigue and unexpected weight change.  HENT:  Negative for congestion, nosebleeds, sneezing, sore throat and trouble swallowing.   Eyes:  Negative for itching and visual disturbance.  Respiratory:  Negative for cough.   Cardiovascular:  Negative for chest pain, palpitations and leg swelling.  Gastrointestinal:  Negative for abdominal distention, blood in stool, diarrhea and nausea.  Genitourinary:  Negative for frequency and hematuria.  Musculoskeletal:  Negative for back pain, gait problem, joint swelling and neck pain.  Skin:  Negative for rash.  Neurological:  Negative for dizziness, tremors, speech difficulty and weakness.  Psychiatric/Behavioral:  Negative for agitation, dysphoric mood and sleep disturbance. The patient is not nervous/anxious.     Objective:  BP 120/70 (BP Location: Left Arm, Patient Position: Sitting, Cuff Size: Normal)   Pulse 62   Temp 98.7 F (37.1 C) (Oral)   Ht 5\' 7"  (1.702 m)   Wt 160  lb (72.6 kg)   SpO2 99%   BMI 25.06 kg/m   BP Readings from Last 3 Encounters:  02/05/23 120/70  07/03/22 98/60  02/05/22 92/60    Wt Readings from Last 3 Encounters:  02/05/23 160 lb (72.6 kg)  07/03/22 152 lb 9.6 oz (69.2 kg)  02/05/22 154 lb (69.9 kg)    Physical Exam Constitutional:      General: He is not in acute distress.    Appearance: He is well-developed.     Comments: NAD  Eyes:     Conjunctiva/sclera: Conjunctivae normal.     Pupils: Pupils are equal, round, and reactive to light.  Neck:     Thyroid: No thyromegaly.     Vascular: No JVD.  Cardiovascular:     Rate and Rhythm: Normal rate and regular rhythm.     Heart sounds: Normal heart sounds. No murmur heard.    No friction rub. No gallop.  Pulmonary:     Effort: Pulmonary effort is normal. No respiratory distress.     Breath sounds: Normal breath sounds. No wheezing or rales.  Chest:     Chest wall: No tenderness.  Abdominal:     General: Bowel sounds are normal. There is no distension.     Palpations: Abdomen is soft. There is no mass.     Tenderness: There is no abdominal tenderness. There is no guarding or rebound.  Musculoskeletal:        General: No tenderness. Normal range  of motion.     Cervical back: Normal range of motion.  Lymphadenopathy:     Cervical: No cervical adenopathy.  Skin:    General: Skin is warm and dry.     Findings: No rash.  Neurological:     Mental Status: He is alert and oriented to person, place, and time.     Cranial Nerves: No cranial nerve deficit.     Motor: No abnormal muscle tone.     Coordination: Coordination normal.     Gait: Gait normal.     Deep Tendon Reflexes: Reflexes are normal and symmetric.  Psychiatric:        Behavior: Behavior normal.        Thought Content: Thought content normal.        Judgment: Judgment normal.   Thyroid nodules Pt declined rectal  Lab Results  Component Value Date   WBC 6.8 02/05/2022   HGB 14.2 02/05/2022   HCT  42.4 02/05/2022   PLT 244.0 02/05/2022   GLUCOSE 110 (H) 08/07/2022   CHOL 111 08/17/2022   TRIG 56.0 08/17/2022   HDL 45.90 08/17/2022   LDLDIRECT 161.0 05/14/2012   LDLCALC 54 08/17/2022   ALT 15 08/07/2022   AST 22 08/07/2022   NA 138 08/07/2022   K 4.6 08/07/2022   CL 106 08/07/2022   CREATININE 0.98 08/07/2022   BUN 18 08/07/2022   CO2 25 08/07/2022   TSH 0.63 02/05/2022   PSA 0.76 02/05/2022   INR 0.9 07/07/2008   HGBA1C 6.1 08/07/2022    US THYROID  Result Date: 03/09/2021 CLINICAL DATA:  Prior ultrasound follow-up. EXAM: THYROID ULTRASOUND TECHNIQUE: Ultrasound examination of the thyroid gland and adjacent soft tissues was performed. COMPARISON:  04/07/2019 FINDINGS: Parenchymal Echotexture: Mildly heterogenous Isthmus: 0.6 cm, previously 0.5 cm Right lobe: 5.1 x 2.2 x 2.8 cm, previously 6.0 x 2.4 x 2.6 cm Left lobe: 6.0 x 2.2 x 2.3 cm, previously 6.3 x 2.4 x 2.3 cm _________________________________________________________ Estimated total number of nodules >/= 1 cm: 4 Number of spongiform nodules >/=  2 cm not described below (TR1): 0 Number of mixed cystic and solid nodules >/= 1.5 cm not described below (TR2): 0 _________________________________________________________ Unchanged scattered multifocal 1 cm or less mixed solid cystic, spongiform, and cystic nodules throughout the right thyroid. These each continue to appear benign and do not warrant additional ultrasound follow-up or tissue sampling. Nodule # 6 (previously 3): Prior biopsy: No Location: Left; Superior Maximum size: 1.3 cm; Other 2 dimensions: 1.2 x 0.8 cm, previously, 1.0 x 0.7 x 0.6 cm Composition: solid/almost completely solid (2) Echogenicity: hypoechoic (2) Shape: not taller-than-wide (0) Margins: smooth (0) Echogenic foci: none (0) ACR TI-RADS total points: 4. ACR TI-RADS risk category:  TR4 (4-6 points). Significant change in size (>/= 20% in two dimensions and minimal increase of 2 mm): Yes Change in features: No  Change in ACR TI-RADS risk category: No ACR TI-RADS recommendations: *Given size (>/= 1 - 1.4 cm) and appearance, a follow-up ultrasound in 1 year should be considered based on TI-RADS criteria. _________________________________________________________ Additional scattered isoechoic mixed cystic solid nodules in the left mid inferior thyroid appear similar since 2017 and do not warrant additional ultrasound follow-up or tissue sampling. IMPRESSION: 1. Similar appearing multinodular goiter. 2. There has been minimal waxing and waning of size and internal characteristics of previously visualized scattered bilateral thyroid nodules. There is slight interval enlargement of the left superior nodule (labeled 6, previously labeled 3, 1.3 cm, previously 1.0 cm), however  this is significantly decreased in size since 2017 at which time this nodule was 1.9 cm. These nodules overall appear stable since 2017, and entered thus declared benign. No dedicated ultrasound follow-up or tissue sampling is recommended. The above is in keeping with the ACR TI-RADS recommendations - J Am Coll Radiol 2017;14:587-595. Marliss Coots, MD [facility] Electronically Signed   By: Marliss Coots MD   On: 03/09/2021 15:42    Assessment & Plan:   Problem List Items Addressed This Visit     Ischemic cardiomyopathy - Primary (Chronic)    Cont on Entresto      Relevant Medications   nitroGLYCERIN (NITROSTAT) 0.4 MG SL tablet   Well adult exam    We discussed age appropriate health related issues, including available/recomended screening tests and vaccinations. Labs were ordered to be later reviewed . All questions were answered. We discussed one or more of the following - seat belt use, use of sunscreen/sun exposure exercise, safe sex, fall risk reduction, second hand smoke exposure, firearm use and storage, seat belt use, a need for adhering to healthy diet and exercise. Labs were  ordered.  All questions were answered. Declined colonoscopy Opth q 24 mo Shingrix advised Cologuard (colonoscopy declined) 2017, 2020, 2023 Chest CT  - low dose      Relevant Orders   CT CHEST LUNG CANCER SCREENING LOW DOSE WO CONTRAST   TSH   Lipid panel   PSA   Comprehensive metabolic panel   Urinalysis   CBC with Differential/Platelet   Hemoglobin A1c   Thyroid nodule   Relevant Orders   US THYROID   Other Visit Diagnoses     Hyperglycemia       Relevant Orders   Hemoglobin A1c   Screening for lung cancer       Relevant Orders   CT CHEST LUNG CANCER SCREENING LOW DOSE WO CONTRAST         Meds ordered this encounter  Medications   nitroGLYCERIN (NITROSTAT) 0.4 MG SL tablet    Sig: Place 1 tablet (0.4 mg total) under the tongue every 5 (five) minutes as needed.    Dispense:  25 tablet    Refill:  3      Follow-up: Return in about 1 year (around 02/05/2024) for Wellness Exam.  Sonda Primes, MD

## 2023-02-05 NOTE — Assessment & Plan Note (Signed)
Cont on [employer]

## 2023-03-05 ENCOUNTER — Ambulatory Visit
Admission: RE | Admit: 2023-03-05 | Discharge: 2023-03-05 | Disposition: A | Discharge status: Home / Self Care | Payer: 59 | Source: Ambulatory Visit | Attending: Internal Medicine | Admitting: Internal Medicine

## 2023-03-05 DIAGNOSIS — E041 Nontoxic single thyroid nodule: Secondary | ICD-10-CM

## 2023-03-14 ENCOUNTER — Ambulatory Visit
Admission: RE | Admit: 2023-03-14 | Discharge: 2023-03-14 | Disposition: A | Discharge status: Home / Self Care | Payer: 59 | Source: Ambulatory Visit | Attending: Internal Medicine | Admitting: Internal Medicine

## 2023-03-14 DIAGNOSIS — Z0000 Encounter for general adult medical examination without abnormal findings: Secondary | ICD-10-CM

## 2023-03-14 DIAGNOSIS — Z122 Encounter for screening for malignant neoplasm of respiratory organs: Secondary | ICD-10-CM

## 2023-03-21 ENCOUNTER — Other Ambulatory Visit: Payer: Self-pay | Admitting: Internal Medicine

## 2023-03-21 DIAGNOSIS — R911 Solitary pulmonary nodule: Secondary | ICD-10-CM

## 2023-04-15 ENCOUNTER — Other Ambulatory Visit: Payer: Self-pay | Admitting: Cardiology

## 2023-04-18 ENCOUNTER — Ambulatory Visit: Payer: 59 | Admitting: Pulmonary Disease

## 2023-04-18 ENCOUNTER — Encounter: Payer: Self-pay | Admitting: Pulmonary Disease

## 2023-04-18 VITALS — BP 90/70 | HR 61 | Ht 67.0 in | Wt 153.6 lb

## 2023-04-18 DIAGNOSIS — Z87891 Personal history of nicotine dependence: Secondary | ICD-10-CM | POA: Diagnosis not present

## 2023-04-18 DIAGNOSIS — R918 Other nonspecific abnormal finding of lung field: Secondary | ICD-10-CM

## 2023-04-18 NOTE — Patient Instructions (Addendum)
Thank you for visiting Dr. Tonia Brooms at [facility]. Today we recommend the following:  Orders Placed This Encounter  Procedures   CT Chest Wo Contrast    Return in about 3 months (around 07/19/2023) for with Kandice Robinsons, NP, or Dr. Tonia Brooms, after CT Chest.    Please do your part to reduce the spread of COVID-19.

## 2023-04-18 NOTE — Progress Notes (Signed)
Synopsis: Referred in July 2024 for Lung nodule by Plotnikov, Georgina Quint, MD  Subjective:   PATIENT ID: Blake [name] GENDER: male DOB: 07-05-1956, MRN: [id number]  Chief Complaint  Patient presents with   Consult    Consult for lung nodule.    This is a 67 year old gentleman longstanding history of tobacco use quit several years ago has a history of ischemic cardiomyopathy, history of meningioma, history of thyroid nodules.  Was seen after having a lung cancer screening CT which revealed subsolid lesions in the right chest also has a more subsolid groundglass lesion in the right apex.  Patient is a retired Art gallery manager.  He lives here in [place].    Past Medical History:  Diagnosis Date   CAD (coronary artery disease), native coronary artery 01/2017   a. 01/2017: anterior STEMI with 100% occlusion of the prox-mid LAD with 95% distal LAD stenosis due to distal embolization of thrombus. A Synergy DES was placed to the prox-LAD.   Cancer [facility]) 2009   brain tumor   Hyperlipemia    Ischemic cardiomyopathy    a. 01/2017: initial echo showed a reduced EF of 30-35% with repeat echo of 40-45%. -->  However follow-up echo showed EF of 30-35%, EF confirmed to be 30% by MRI October 2018.   LUMBAR RADICULOPATHY, RIGHT 07/02/2008   Qualifier: Diagnosis of  By: Posey Rea MD, Georgina Quint    Memory loss 07/05/2008   Qualifier: Diagnosis of  By: Posey Rea MD, Georgina Quint    MENTAL CONFUSION 07/05/2008   Qualifier: Diagnosis of  By: Posey Rea MD, Georgina Quint    Neoplasm of unspecified nature of brain 08/18/2008   Qualifier: Diagnosis of  By: Posey Rea MD, Georgina Quint    Rash and other nonspecific skin eruption 08/18/2008   Qualifier: Diagnosis of  By: Tora Perches     ST elevation myocardial infarction (STEMI) of anterior wall (HCC) 01/2017   Large anterior infarct.   TOBACCO USER 11/28/2009   Qualifier: Diagnosis of  By: Plotnikov MD, Georgina Quint    VITAMIN D DEFICIENCY 07/02/2008   Qualifier: Diagnosis of   By: Posey Rea MD, Georgina Quint      Family History  Problem Relation Age of Onset   Alzheimer's disease Father    Parkinsonism Father    Arthritis Mother    Goiter Mother    Hypertension Other      Past Surgical History:  Procedure Laterality Date   CORONARY STENT INTERVENTION N/A 02/23/2017   Procedure: Coronary Stent Intervention;  Surgeon: Marykay Lex, MD;  Location: [facility] INVASIVE CV LAB;; p-mLAD 100% (thrombotic) --> PCI- SYNERGY DES 3 X 24 -- 3.1 mm; distal embolization to apical LAD.   CORONARY/GRAFT ACUTE MI REVASCULARIZATION N/A 02/23/2017   Procedure: Coronary/Graft Acute MI Revascularization;  Surgeon: Marykay Lex, MD;  Location: [facility] INVASIVE CV LAB;  Service: Cardiovascular;;; PCI of 100% thrombotic p-mLAD in setting of Anterior STEMI - delayed presentation   CRANIECTOMY / CRANIOTOMY FOR EXCISION OF BRAIN TUMOR  2009   surgery and XRT at [facility]   LEFT HEART CATH AND CORONARY ANGIOGRAPHY N/A 02/23/2017   Procedure: Left Heart Cath and Coronary Angiography;  Surgeon: Marykay Lex, MD;  Location: [facility] INVASIVE CV LAB;;; Delayed presentation of Anterior STEMI -- p-m LAD 100% thrombotic occlusion, 30% AVG Cx. EF ~35% (anterior-anteroapical severe hypokinesis   TRANSTHORACIC ECHOCARDIOGRAM  11/2017   Anterior , septal, apical and inferior apical hypokinesis. The cavity size was moderately dilated. Wall  thickness was normal. Systolic function was  severely reduced. Mild AoV calcification    Social History   Socioeconomic History   Marital status: Married    Spouse name: Not on file   Number of children: 1   Years of education: Not on file   Highest education level: Not on file  Occupational History   Occupation: Garment/textile technologist: [employer]  Tobacco Use   Smoking status: Former    Current packs/day: 0.00    Types: Cigarettes    Quit date: 03/31/2017    Years since quitting: 6.0   Smokeless tobacco: Never  Vaping Use   Vaping status: Never Used  Substance  and Sexual Activity   Alcohol use: No   Drug use: No   Sexual activity: Yes  Other Topics Concern   Not on file  Social History Narrative   Very active. He has full day of work as an Art gallery manager. He also walks daily after work for at least 30-45 minutes. On weekends he tries to walk at least an hour. This is usually a fast walk or slow jog.      He quit smoking shortly after his MI.   Social Determinants of Health   Financial Resource Strain: Not on file  Food Insecurity: Not on file  Transportation Needs: Not on file  Physical Activity: Not on file  Stress: Not on file  Social Connections: Not on file  Intimate Partner Violence: Not on file     No Known Allergies   Outpatient Medications Prior to Visit  Medication Sig Dispense Refill   atorvastatin (LIPITOR) 40 MG tablet TAKE 1 TABLET BY MOUTH EVERY DAY 90 tablet 3   BRILINTA 60 MG TABS tablet TAKE 1 TABLET BY MOUTH TWICE A DAY 180 tablet 2   carvedilol (COREG) 3.125 MG tablet TAKE 1 TABLET BY MOUTH TWICE A DAY WITH FOOD 180 tablet 3   Cholecalciferol (VITAMIN D3) 50 MCG (2000 UT) capsule Take 1 capsule (2,000 Units total) by mouth daily. 100 capsule 3   nitroGLYCERIN (NITROSTAT) 0.4 MG SL tablet Place 1 tablet (0.4 mg total) under the tongue every 5 (five) minutes as needed. 25 tablet 3   sacubitril-valsartan (ENTRESTO) 49-51 MG TAKE 1 TABLET BY MOUTH TWICE A DAY 180 tablet 0   No facility-administered medications prior to visit.    Review of Systems  Constitutional:  Negative for chills, fever, malaise/fatigue and weight loss.  HENT:  Negative for hearing loss, sore throat and tinnitus.   Eyes:  Negative for blurred vision and double vision.  Respiratory:  Negative for cough, hemoptysis, sputum production, shortness of breath, wheezing and stridor.   Cardiovascular:  Negative for chest pain, palpitations, orthopnea, leg swelling and PND.  Gastrointestinal:  Negative for abdominal pain, constipation, diarrhea, heartburn,  nausea and vomiting.  Genitourinary:  Negative for dysuria, hematuria and urgency.  Musculoskeletal:  Negative for joint pain and myalgias.  Skin:  Negative for itching and rash.  Neurological:  Negative for dizziness, tingling, weakness and headaches.  Endo/Heme/Allergies:  Negative for environmental allergies. Does not bruise/bleed easily.  Psychiatric/Behavioral:  Negative for depression. The patient is not nervous/anxious and does not have insomnia.   All other systems reviewed and are negative.    Objective:  Physical Exam Vitals reviewed.  Constitutional:      General: He is not in acute distress.    Appearance: He is well-developed.  HENT:     Head: Normocephalic and atraumatic.  Eyes:     General: No scleral icterus.  Conjunctiva/sclera: Conjunctivae normal.     Pupils: Pupils are equal, round, and reactive to light.  Neck:     Vascular: No JVD.     Trachea: No tracheal deviation.  Cardiovascular:     Rate and Rhythm: Normal rate and regular rhythm.     Heart sounds: Normal heart sounds. No murmur heard. Pulmonary:     Effort: Pulmonary effort is normal. No tachypnea, accessory muscle usage or respiratory distress.     Breath sounds: No stridor. No wheezing, rhonchi or rales.  Abdominal:     General: There is no distension.     Palpations: Abdomen is soft.     Tenderness: There is no abdominal tenderness.  Musculoskeletal:        General: No tenderness.     Cervical back: Neck supple.  Lymphadenopathy:     Cervical: No cervical adenopathy.  Skin:    General: Skin is warm and dry.     Capillary Refill: Capillary refill takes less than 2 seconds.     Findings: No rash.  Neurological:     Mental Status: He is alert and oriented to person, place, and time.  Psychiatric:        Behavior: Behavior normal.      Vitals:   04/18/23 0957  BP: 90/70  Pulse: 61  SpO2: 99%  Weight: 153 lb 9.6 oz (69.7 kg)  Height: 5\' 7"  (1.702 m)   99% on RA BMI Readings  from Last 3 Encounters:  04/18/23 24.06 kg/m  02/05/23 25.06 kg/m  07/03/22 23.90 kg/m   Wt Readings from Last 3 Encounters:  04/18/23 153 lb 9.6 oz (69.7 kg)  02/05/23 160 lb (72.6 kg)  07/03/22 152 lb 9.6 oz (69.2 kg)     CBC    Component Value Date/Time   WBC 6.4 02/05/2023 0948   RBC 4.76 02/05/2023 0948   HGB 14.0 02/05/2023 0948   HCT 42.1 02/05/2023 0948   PLT 252.0 02/05/2023 0948   MCV 88.6 02/05/2023 0948   MCH 28.6 02/26/2017 0435   MCHC 33.3 02/05/2023 0948   RDW 13.9 02/05/2023 0948   LYMPHSABS 1.5 02/05/2023 0948   MONOABS 0.4 02/05/2023 0948   EOSABS 0.2 02/05/2023 0948   BASOSABS 0.0 02/05/2023 0948     Chest Imaging: Lung cancer screening CT June 2024: Right upper lobe predominant subsolid lesion concerning for malignancy other small lesions within the right chest. CT imaging reviewed today by me in the office independently.  Pulmonary Functions Testing Results:     No data to display          FeNO:   Pathology:   Echocardiogram:   Heart Catheterization:     Assessment & Plan:     ICD-10-CM   1. Lung nodules  R91.8 CT Chest Wo Contrast    2. Former smoker  Z87.891     3. Abnormal CT lung screening  R91.8       Discussion:  This is a 67 year old gentleman longstanding history of tobacco use, multiple pulmonary nodules, former smoker.  Plan: We reviewed his CT imaging today in the office.  He has a history of ischemic cardiomyopathy as well as on antiplatelets.  I do not think moving forward with bronchoscopy at this time is prudent for a right upper lobe subsolid lesion that could be previous scarring.  He does have history of tobacco use and is at high risk for the development of malignancy. He is of Asian descent which also predisposes him for  potential increased risk at that carries a EGFR mutation however he has what appears to be a potential diagnosis of multifocal disease in the right chest but the other 2 lesions are very  small.  Would recommend repeat CT scan of the chest in October. Would need cardiac clearance before consideration of bronchoscopy in the future if needed. Patient is not interested in surgery would prefer not to have any open surgical interventions but would be open to radiation if needed.  Return to clinic in October after CT complete to discuss next steps.   Current Outpatient Medications:    atorvastatin (LIPITOR) 40 MG tablet, TAKE 1 TABLET BY MOUTH EVERY DAY, Disp: 90 tablet, Rfl: 3   BRILINTA 60 MG TABS tablet, TAKE 1 TABLET BY MOUTH TWICE A DAY, Disp: 180 tablet, Rfl: 2   carvedilol (COREG) 3.125 MG tablet, TAKE 1 TABLET BY MOUTH TWICE A DAY WITH FOOD, Disp: 180 tablet, Rfl: 3   Cholecalciferol (VITAMIN D3) 50 MCG (2000 UT) capsule, Take 1 capsule (2,000 Units total) by mouth daily., Disp: 100 capsule, Rfl: 3   nitroGLYCERIN (NITROSTAT) 0.4 MG SL tablet, Place 1 tablet (0.4 mg total) under the tongue every 5 (five) minutes as needed., Disp: 25 tablet, Rfl: 3   sacubitril-valsartan (ENTRESTO) 49-51 MG, TAKE 1 TABLET BY MOUTH TWICE A DAY, Disp: 180 tablet, Rfl: 0    Josephine Igo, DO [facility] 04/18/2023 10:31 AM

## 2023-05-07 ENCOUNTER — Other Ambulatory Visit: Payer: Self-pay | Admitting: Cardiology

## 2023-06-06 ENCOUNTER — Encounter: Payer: Self-pay | Admitting: Pulmonary Disease

## 2023-06-19 ENCOUNTER — Ambulatory Visit
Admission: RE | Admit: 2023-06-19 | Discharge: 2023-06-19 | Disposition: A | Discharge status: Home / Self Care | Payer: 59 | Source: Ambulatory Visit | Attending: Pulmonary Disease | Admitting: Pulmonary Disease

## 2023-06-19 DIAGNOSIS — R918 Other nonspecific abnormal finding of lung field: Secondary | ICD-10-CM

## 2023-06-28 ENCOUNTER — Other Ambulatory Visit: Payer: Self-pay | Admitting: Cardiology

## 2023-06-28 MED ORDER — ENTRESTO 49-51 MG PO TABS: 1.0000 | ORAL_TABLET | Freq: Two times a day (BID) | ORAL | 0 refills | Status: DC

## 2023-07-03 NOTE — Progress Notes (Signed)
Patient has an appt with SG, NP on 16th coming up to review results and plan follow up ct needs  Thanks,  BLI  Josephine Igo, DO [facility] 07/03/2023 4:32 PM

## 2023-07-17 ENCOUNTER — Ambulatory Visit: Payer: 59 | Admitting: Acute Care

## 2023-07-17 ENCOUNTER — Ambulatory Visit: Payer: 59 | Admitting: Pulmonary Disease

## 2023-07-26 ENCOUNTER — Encounter: Payer: Self-pay | Admitting: Acute Care

## 2023-07-26 ENCOUNTER — Ambulatory Visit (INDEPENDENT_AMBULATORY_CARE_PROVIDER_SITE_OTHER): Payer: 59 | Admitting: Acute Care

## 2023-07-26 VITALS — BP 110/68 | HR 68 | Ht 67.0 in | Wt 158.8 lb

## 2023-07-26 DIAGNOSIS — Z87891 Personal history of nicotine dependence: Secondary | ICD-10-CM | POA: Diagnosis not present

## 2023-07-26 DIAGNOSIS — R911 Solitary pulmonary nodule: Secondary | ICD-10-CM | POA: Diagnosis not present

## 2023-07-26 NOTE — Patient Instructions (Addendum)
It is good to see you today. The lung nodule we have been following has remained stable in the last 3 months. We will do a 6 month follow up Ct Chest without contrast. This will be due mid march 2025. You will get a call to schedule this closer to the time.  We will do a follow up visit to review the results after the scan has been completed. Please call if you need [place] sooner. Please contact office for sooner follow up if symptoms do not improve or worsen or seek emergency care

## 2023-07-26 NOTE — Progress Notes (Signed)
History of Present Illness 67 year old gentleman longstanding history of tobacco use, multiple pulmonary nodules, former smoker , and history of ischemic cardiomyopathy  on antiplatelets. He is followed through the screening program. He was seen by Dr. Tonia Brooms 04/2023 for the finding of sub solid lesions in the right chest., and right apex.      Synopsis: 67 year old gentleman longstanding history of tobacco use, multiple pulmonary nodules, former smoker followed through the screening program. He was seen by Dr. Tonia Brooms 04/2023 for the finding of sub solid lesions in the right chest., and right apex.Plan after that visit was for follow up CT Chest in th October 2024. He is of Asian descent which also predisposes him for potential increased risk at that carries a EGFR mutation    07/26/2023 Pt. Presents for follow up after surveillance CT Chest. He states he has been doing well from a respiratory perspective. We have reviewed his scans side by side. The nodule of concern in the right apex is stable. There does not appear to be any significant increase in the solid component of the nodule. Plan will be for a 6 month follow up CT Chest without contrast and then follow up in the office. If this nodule remains stable, we can return the patient to the lung cancer screening program in 2026.   Test Results: LDCT 03/14/2023 Pulmonary nodules are noted, most concerning of which is a mixed solid and sub solid lesion in the right upper lobe near the apex (axial image 37) which has an overall volume derived mean diameter of 15.4 mm, and central solid components with a mean diameter of 8.1 mm, concerning for primary bronchogenic adenocarcinoma. No acute consolidative airspace disease. No pleural effusions. Mild diffuse bronchial wall thickening with mild centrilobular and paraseptal emphysema.  Right upper lobe pulmonary nodule with imaging characteristics concerning for probable primary bronchogenic  adenocarcinoma, categorized as Lung-RADS 4BS, suspicious      Latest Ref Rng & Units 02/05/2023    9:48 AM 02/05/2022    8:58 AM 02/02/2021    9:55 AM  CBC  WBC 4.0 - 10.5 K/uL 6.4  6.8  6.2   Hemoglobin 13.0 - 17.0 g/dL 66.4  40.3  47.4   Hematocrit 39.0 - 52.0 % 42.1  42.4  40.6   Platelets 150.0 - 400.0 K/uL 252.0  244.0  215.0        Latest Ref Rng & Units 02/05/2023    9:48 AM 08/07/2022    7:38 AM 02/05/2022    8:58 AM  BMP  Glucose 70 - 99 mg/dL 259  563  875   BUN 6 - 23 mg/dL 14  18  20    Creatinine 0.40 - 1.50 mg/dL 6.43  3.29  5.18   Sodium 135 - 145 mEq/L 140  138  139   Potassium 3.5 - 5.1 mEq/L 5.3 No hemolysis seen  4.6  5.0   Chloride 96 - 112 mEq/L 107  106  105   CO2 19 - 32 mEq/L 26  25  27    Calcium 8.4 - 10.5 mg/dL 9.1  9.0  9.1     BNP No results found for: "BNP"  ProBNP    Component Value Date/Time   PROBNP 11.0 06/01/2008 1020    PFT No results found for: "FEV1PRE", "FEV1POST", "FVCPRE", "FVCPOST", "TLC", "DLCOUNC", "PREFEV1FVCRT", "PSTFEV1FVCRT"  No results found.   Past medical hx Past Medical History:  Diagnosis Date   CAD (coronary artery disease), native coronary artery 01/2017  a. 01/2017: anterior STEMI with 100% occlusion of the prox-mid LAD with 95% distal LAD stenosis due to distal embolization of thrombus. A Synergy DES was placed to the prox-LAD.   Cancer [facility]) 2009   brain tumor   Hyperlipemia    Ischemic cardiomyopathy    a. 01/2017: initial echo showed a reduced EF of 30-35% with repeat echo of 40-45%. -->  However follow-up echo showed EF of 30-35%, EF confirmed to be 30% by MRI October 2018.   LUMBAR RADICULOPATHY, RIGHT 07/02/2008   Qualifier: Diagnosis of  By: Posey Rea MD, Georgina Quint    Memory loss 07/05/2008   Qualifier: Diagnosis of  By: Posey Rea MD, Georgina Quint    MENTAL CONFUSION 07/05/2008   Qualifier: Diagnosis of  By: Posey Rea MD, Georgina Quint    Neoplasm of unspecified nature of brain 08/18/2008   Qualifier: Diagnosis  of  By: Posey Rea MD, Georgina Quint    Rash and other nonspecific skin eruption 08/18/2008   Qualifier: Diagnosis of  By: Tora Perches     ST elevation myocardial infarction (STEMI) of anterior wall (HCC) 01/2017   Large anterior infarct.   TOBACCO USER 11/28/2009   Qualifier: Diagnosis of  By: Plotnikov MD, Georgina Quint    VITAMIN D DEFICIENCY 07/02/2008   Qualifier: Diagnosis of  By: Plotnikov MD, Georgina Quint      Social History   Tobacco Use   Smoking status: Former    Current packs/day: 0.00    Types: Cigarettes    Quit date: 03/31/2017    Years since quitting: 6.3   Smokeless tobacco: Never  Vaping Use   Vaping status: Never Used  Substance Use Topics   Alcohol use: No   Drug use: No    Mr.Fennel reports that he quit smoking about 6 years ago. His smoking use included cigarettes. He has never used smokeless tobacco. He reports that he does not drink alcohol and does not use drugs.  Tobacco Cessation: Former smoker , quit 2018   Past surgical hx, Family hx, Social hx all reviewed.  Current Outpatient Medications on File Prior to Visit  Medication Sig   atorvastatin (LIPITOR) 40 MG tablet TAKE 1 TABLET BY MOUTH EVERY DAY   BRILINTA 60 MG TABS tablet TAKE 1 TABLET BY MOUTH TWICE A DAY   carvedilol (COREG) 3.125 MG tablet TAKE 1 TABLET BY MOUTH TWICE A DAY WITH FOOD   Cholecalciferol (VITAMIN D3) 50 MCG (2000 UT) capsule Take 1 capsule (2,000 Units total) by mouth daily.   nitroGLYCERIN (NITROSTAT) 0.4 MG SL tablet Place 1 tablet (0.4 mg total) under the tongue every 5 (five) minutes as needed.   sacubitril-valsartan (ENTRESTO) 49-51 MG Take 1 tablet by mouth 2 (two) times daily.   No current facility-administered medications on file prior to visit.     No Known Allergies  Review Of Systems:  Constitutional:   No  weight loss, night sweats,  Fevers, chills, fatigue, or  lassitude.  HEENT:   No headaches,  Difficulty swallowing,  Tooth/dental problems, or  Sore throat,                 No sneezing, itching, ear ache, nasal congestion, post nasal drip,   CV:  No chest pain,  Orthopnea, PND, swelling in lower extremities, anasarca, dizziness, palpitations, syncope.   GI  No heartburn, indigestion, abdominal pain, nausea, vomiting, diarrhea, change in bowel habits, loss of appetite, bloody stools.   Resp: No shortness of breath with exertion or at rest.  No excess mucus, no productive cough,  No non-productive cough,  No coughing up of blood.  No change in color of mucus.  No wheezing.  No chest wall deformity  Skin: no rash or lesions.  GU: no dysuria, change in color of urine, no urgency or frequency.  No flank pain, no hematuria   MS:  No joint pain or swelling.  No decreased range of motion.  No back pain.  Psych:  No change in mood or affect. No depression or anxiety.  No memory loss.   Vital Signs BP 110/68 (BP Location: Left Arm, Cuff Size: Normal)   Pulse 68   Ht 5\' 7"  (1.702 m)   Wt 158 lb 12.8 oz (72 kg)   SpO2 100%   BMI 24.87 kg/m    Physical Exam:  General- No distress,  A&Ox3, pleasant ENT: No sinus tenderness, TM clear, pale nasal mucosa, no oral exudate,no post nasal drip, no LAN Cardiac: S1, S2, regular rate and rhythm, no murmur Chest: No wheeze/ rales/ dullness; no accessory muscle use, no nasal flaring, no sternal retractions Abd.: Soft Non-tender, ND, BS +, Body mass index is 24.87 kg/m.  Ext: No clubbing cyanosis, edema Neuro:  normal strength, MAE x 4, A&O x 3 Skin: No rashes, warm and dry, no lesions  Psych: normal mood and behavior   Assessment/Plan  Former smoker with EGFR mutation with abnormal screening CT chest 03/2023 Follow up Ct Chest 07/2023 shows nodule stability Plan The lung nodule we have been following has remained stable in the last 3 months. We will do a 6 month follow up Ct Chest without contrast. This will be due mid march 2025. You will get a call to schedule this closer to the time.  We will do a  follow up visit to review the results after the scan has been completed. Please call if you need [place] sooner. Please contact office for sooner follow up if symptoms do not improve or worsen or seek emergency care    I spent 20 minutes dedicated to the care of this patient on the date of this encounter to include pre-visit review of records, face-to-face time with the patient discussing conditions above, post visit ordering of testing, clinical documentation with the electronic health record, making appropriate referrals as documented, and communicating necessary information to the patient's healthcare team.   Bevelyn Ngo, NP 07/26/2023  8:37 AM

## 2023-07-31 ENCOUNTER — Ambulatory Visit: Payer: 59 | Attending: Cardiology | Admitting: Cardiology

## 2023-07-31 ENCOUNTER — Encounter: Payer: Self-pay | Admitting: Cardiology

## 2023-07-31 VITALS — BP 116/62 | HR 65 | Ht 67.0 in | Wt 157.0 lb

## 2023-07-31 DIAGNOSIS — Z955 Presence of coronary angioplasty implant and graft: Secondary | ICD-10-CM

## 2023-07-31 DIAGNOSIS — I213 ST elevation (STEMI) myocardial infarction of unspecified site: Secondary | ICD-10-CM

## 2023-07-31 DIAGNOSIS — I255 Ischemic cardiomyopathy: Secondary | ICD-10-CM

## 2023-07-31 DIAGNOSIS — I5042 Chronic combined systolic (congestive) and diastolic (congestive) heart failure: Secondary | ICD-10-CM | POA: Diagnosis not present

## 2023-07-31 DIAGNOSIS — R7303 Prediabetes: Secondary | ICD-10-CM

## 2023-07-31 DIAGNOSIS — E785 Hyperlipidemia, unspecified: Secondary | ICD-10-CM

## 2023-07-31 DIAGNOSIS — I2510 Atherosclerotic heart disease of native coronary artery without angina pectoris: Secondary | ICD-10-CM

## 2023-07-31 MED ORDER — DAPAGLIFLOZIN PROPANEDIOL 10 MG PO TABS: 10.0000 mg | ORAL_TABLET | Freq: Every day | ORAL | 3 refills | Status: AC

## 2023-07-31 NOTE — Progress Notes (Signed)
Cardiology Office Note:  .   Date:  08/04/2023  ID:  Blake [name], DOB September 16, 1956, MRN [id number] PCP: Tresa Garter, MD  [facility] Providers Cardiologist:  Bryan Lemma, MD Electrophysiologist:  Will Jorja Loa, MD     No chief complaint on file.   Patient Profile: .     Blake [name] is a healthy appearing 67 y.o. [place] male with a PMH notable for CAD-Ischemic CM who presents here for ~ annualyf/u at the request of Plotnikov, Georgina Quint, MD.  CV History: 01/2017: Large delayed presentation anterior MI and PCI to LAD Ischemic CM-EF 25 to 30%, declined ICD; NYHA class I CHF symptoms. Borderline DM-2, HLD     Blake [name] was last seen on in October 2023 doing very well.  Despite having significant dyspnea, continues to do well without activity angina or heart failure symptoms.  Still indicating that he did not see the reason to proceed with ICD placement.  Stable regimen.  No changes.  Subjective  Discussed the use of AI scribe software for clinical note transcription with the patient, who gave verbal consent to proceed.  History of Present Illness   The patient, an Art gallery manager, presents for a routine follow-up visit. They report maintaining a regular exercise regimen, jogging and walking approximately four to five miles daily. They have not experienced any cardiac symptoms such as heart racing, skipping, or flip-flopping.  The patient has a history of a heart attack and has been under surveillance for lung cancer with regular CT chest scans. They express concern about a recent CT scan report that mentioned borderline enlargement of the heart with some rounding of the apex, aortic atherosclerosis, and calcification of the coronary arteries. The patient is aware of their weakened heart condition, with an ejection fraction of 25-30%, and a large area of scar tissue from a previous heart attack.  Despite their low ejection fraction, the patient reports no symptoms  of heart failure such as shortness of breath, swelling, or difficulty lying flat. They have not experienced any abnormal heart rhythms, dizziness, or fainting spells.  He denies any anginal symptoms of chest pain /p;ressure at rest or with a decent amount of exertion.  The patient's blood work from May showed a total cholesterol of 110, HDL of 39, LDL of 58, triglycerides of 62, and an A1c of 6.2, placing them in the borderline diabetes range. Their kidney function was normal with a creatinine of 0.88, and their TSH was on the low normal side at 0.6.  The patient is currently on carvedilol, Entresto, Brilinta 60mg  BID, and Lipitor. They have not reported any adverse effects from these medications.  The patient's child is also mentioned, who is currently working for [employer] and studying for an [employer] at [employer].     Cardiovascular ROS: no chest pain or dyspnea on exertion negative for - edema, irregular heartbeat, orthopnea, palpitations, paroxysmal nocturnal dyspnea, rapid heart rate, shortness of breath, or syncope/near syncope or TIA/hour/BX, claudication  ROS:  Review of Systems - Negative except mild MSK pains    Objective  Current Meds  Medication Sig   atorvastatin (LIPITOR) 40 MG tablet TAKE 1 TABLET BY MOUTH EVERY DAY   BRILINTA 60 MG TABS tablet TAKE 1 TABLET BY MOUTH TWICE A DAY   carvedilol (COREG) 3.125 MG tablet TAKE 1 TABLET BY MOUTH TWICE A DAY WITH FOOD   Cholecalciferol (VITAMIN D3) 50 MCG (2000 UT) capsule Take 1 capsule (2,000 Units total) by mouth daily.   nitroGLYCERIN (  NITROSTAT) 0.4 MG SL tablet Place 1 tablet (0.4 mg total) under the tongue every 5 (five) minutes as needed.   sacubitril-valsartan (ENTRESTO) 49-51 MG Take 1 tablet by mouth 2 (two) times daily.    Studies Reviewed: Marland Kitchen   EKG Interpretation Date/Time:  Wednesday July 31 2023 08:23:44 EDT Ventricular Rate:  65 PR Interval:  176 QRS Duration:  140 QT Interval:  412 QTC Calculation: 428 R  Axis:   92  Text Interpretation: Normal sinus rhythm Right bundle branch block with repolarization abnormality Anteroseptal infarct (cited on or before 23-Feb-2017) T wave abnormality, consider lateral ischemia When compared with ECG of 24-Feb-2017 07:05, Confirmed by Bryan Lemma ([id number]) on 07/31/2023 8:34:48 AM    LABS Total cholesterol: 110 (01/2023) HDL: 39 (01/2023) LDL: 58 (01/2023) Triglycerides: 62 (01/2023) A1c: 6.2 (01/2023) Creatinine: 0.88 (01/2023) Hemoglobin: 14 (01/2023) Potassium: 4.6 (01/2023) TSH: 0.6 (01/2023) Platelets: 252 (01/2023)  RADIOLOGY CT chest: Borderline enlarged heart with rounding of the apex, aortic atherosclerosis, calcification of the coronary arteries, weakened and thinned anterior wall with bulging (03/2023) Cardiac MRI: Ejection fraction 32%, full thickness scar from anterior wall (07/2017)  DIAGNOSTIC Echocardiogram: Ejection fraction 25-30%, thinned and weakened anterior septum, inferior septum, and anterior wall (11/2013)  Risk Assessment/Calculations:         Physical Exam:   VS:  BP 116/62 (BP Location: Left Arm, Patient Position: Sitting, Cuff Size: Normal)   Pulse 65   Ht 5\' 7"  (1.702 m)   Wt 157 lb (71.2 kg)   SpO2 98%   BMI 24.59 kg/m    Wt Readings from Last 3 Encounters:  07/31/23 157 lb (71.2 kg)  07/26/23 158 lb 12.8 oz (72 kg)  04/18/23 153 lb 9.6 oz (69.7 kg)    GEN: Well nourished, well developed in no acute distress; healthy appearing  NECK: No JVD; No carotid bruits CARDIAC: Normal S1, S2; RRR, no murmurs, rubs, gallops RESPIRATORY:  Clear to auscultation without rales, wheezing or rhonchi ; nonlabored, good air movement. ABDOMEN: Soft, non-tender, non-distended EXTREMITIES:  No edema; No deformity     ASSESSMENT AND PLAN: .    Problem List Items Addressed This Visit       Cardiology Problems   Chronic combined systolic and diastolic heart failure, NYHA class 1 (HCC) (Chronic)    Post Myocardial  Infarction with Reduced Ejection Fraction EF 25-30% with anterior wall thinning and scarring. No symptoms of heart failure or arrhythmias. Stable on Carvedilol 3.125mg  BID and Entresto. -Continue current regimen. -Add Farxiga 10 mg po daily      Relevant Orders   EKG 12-Lead (Completed)   Coronary artery disease involving native coronary artery of native heart without angina pectoris - Primary (Chronic)    He remains quite active run/walking at least 1-2 miles almost every day with no symptoms of angina.  NYHA Class I Stable on Carvedilol & Entresto along with Lipitor 40 mg daily and Brilinta 60 mg po BID (maintenance SAPT).  -Continue current regimen.      Relevant Orders   EKG 12-Lead (Completed)   Hyperlipidemia LDL goal <70 (Chronic)    Most recent a panel showed LDL 58 with cholesterol 110.  Plan: Continue 40 mg Lipitor along with dietary modifications.      Ischemic cardiomyopathy (Chronic)    EF post MI with anterior infarct.  Confirmed by MRI echocardiogram.  Was referred to consider the possibility of ICD.  Patient would prefer to avoid ICD at this time.      Relevant  Orders   EKG 12-Lead (Completed)   ST elevation myocardial infarction (STEMI), subsequent episode of care [facility]) (Chronic)    Distant history of anterior MI-late presentation with LAD occlusion status post PCI with significant downstream mineralization.  Subsequent ischemic cardiomyopathy but no heart failure symptoms despite reduced EF.        Other   Prediabetes (Chronic)    A1C 6.2, stable. -Initiate Farxiga 10mg  daily for potential heart and kidney benefits and to stabilize A1C.      Presence of drug coated stent in LAD coronary artery (Chronic)    Remains on Brilinta 60 mg twice daily monotherapy. Okay to hold 5 to 7 days preop or surgeries or procedures and restart when safe postop.             Follow-Up: Return in about 1 year (around 07/30/2024) for 1 Yr Follow-up, Routine follow up with  me. -Check labs in 3-4 months to assess response to [place].   Total time spent: 21 min spent with patient + 15 min spent charting = 36 min    Signed, Marykay Lex, MD, MS Bryan Lemma, M.D., M.S. Interventional Cardiologist  [facility]  Pager # [phone] Phone # [phone] [address] [place], [place] [address]

## 2023-07-31 NOTE — Patient Instructions (Addendum)
Medication Instructions:     Start Farxiga 10 mg  one tablet daily  ( help  with bringing decrease hgbA1c and heart failure)  *If you need a refill on your cardiac medications before your next appointment, please call your pharmacy*   Lab Work:  Not needed   If you have labs (blood work) drawn today and your tests are completely normal, you will receive your results only by: MyChart Message (if you have MyChart) OR A paper copy in the mail If you have any lab test that is abnormal or we need to change your treatment, we will call you to review the results.   Testing/Procedures: Not needed   Follow-Up: At [facility], you and your health needs are our priority.  As part of our continuing mission to provide you with exceptional heart care, we have created designated Provider Care Teams.  These Care Teams include your primary Cardiologist (physician) and Advanced Practice Providers (APPs -  Physician Assistants and Nurse Practitioners) who all work together to provide you with the care you need, when you need it.     Your next appointment:   12 month(s)  The format for your next appointment:   In Person  Provider:   Bryan Lemma, MD

## 2023-08-04 DIAGNOSIS — R7303 Prediabetes: Secondary | ICD-10-CM | POA: Insufficient documentation

## 2023-08-04 NOTE — Assessment & Plan Note (Signed)
Post Myocardial Infarction with Reduced Ejection Fraction EF 25-30% with anterior wall thinning and scarring. No symptoms of heart failure or arrhythmias. Stable on Carvedilol 3.125mg  BID and Entresto. -Continue current regimen. -Add Farxiga 10 mg po daily

## 2023-08-04 NOTE — Assessment & Plan Note (Signed)
Distant history of anterior MI-late presentation with LAD occlusion status post PCI with significant downstream mineralization.  Subsequent ischemic cardiomyopathy but no heart failure symptoms despite reduced EF.

## 2023-08-04 NOTE — Assessment & Plan Note (Signed)
EF post MI with anterior infarct.  Confirmed by MRI echocardiogram.  Was referred to consider the possibility of ICD.  Patient would prefer to avoid ICD at this time.

## 2023-08-04 NOTE — Assessment & Plan Note (Signed)
Remains on Brilinta 60 mg twice daily monotherapy. Okay to hold 5 to 7 days preop or surgeries or procedures and restart when safe postop.

## 2023-08-04 NOTE — Assessment & Plan Note (Signed)
Most recent a panel showed LDL 58 with cholesterol 110.  Plan: Continue 40 mg Lipitor along with dietary modifications.

## 2023-08-04 NOTE — Assessment & Plan Note (Addendum)
He remains quite active run/walking at least 1-2 miles almost every day with no symptoms of angina.  NYHA Class I Stable on Carvedilol & Entresto along with Lipitor 40 mg daily and Brilinta 60 mg po BID (maintenance SAPT).  -Continue current regimen.

## 2023-08-04 NOTE — Assessment & Plan Note (Signed)
A1C 6.2, stable. -Initiate Farxiga 10mg  daily for potential heart and kidney benefits and to stabilize A1C.

## 2023-08-08 ENCOUNTER — Other Ambulatory Visit: Payer: Self-pay | Admitting: Cardiology

## 2023-08-28 ENCOUNTER — Other Ambulatory Visit: Payer: Self-pay | Admitting: Cardiology

## 2023-11-02 ENCOUNTER — Other Ambulatory Visit: Payer: Self-pay | Admitting: Cardiology

## 2023-12-18 ENCOUNTER — Ambulatory Visit
Admission: RE | Admit: 2023-12-18 | Discharge: 2023-12-18 | Disposition: A | Discharge status: Home / Self Care | Source: Ambulatory Visit | Attending: Internal Medicine | Admitting: Internal Medicine

## 2023-12-18 DIAGNOSIS — R911 Solitary pulmonary nodule: Secondary | ICD-10-CM

## 2023-12-20 ENCOUNTER — Other Ambulatory Visit: Payer: Self-pay | Admitting: Acute Care

## 2023-12-20 DIAGNOSIS — Z87891 Personal history of nicotine dependence: Secondary | ICD-10-CM

## 2023-12-20 DIAGNOSIS — R911 Solitary pulmonary nodule: Secondary | ICD-10-CM

## 2024-01-20 ENCOUNTER — Telehealth: Payer: Self-pay

## 2024-01-20 ENCOUNTER — Telehealth: Payer: Self-pay | Admitting: Acute Care

## 2024-01-20 ENCOUNTER — Other Ambulatory Visit: Payer: Self-pay

## 2024-01-20 DIAGNOSIS — Z122 Encounter for screening for malignant neoplasm of respiratory organs: Secondary | ICD-10-CM

## 2024-01-20 DIAGNOSIS — Z87891 Personal history of nicotine dependence: Secondary | ICD-10-CM

## 2024-01-20 DIAGNOSIS — R918 Other nonspecific abnormal finding of lung field: Secondary | ICD-10-CM

## 2024-01-20 NOTE — Telephone Encounter (Signed)
 Spoke with patient and reviewed results below. He is in agreement to 6 month f/u scan, due 06/20/2024. Also reviewed Age advanced 2 vessel coronary calcification, Aortic atherosclerosis and Emphysema. He is on statin therapy and sees Cardiology. Results will be sent to PCP and Cardiology. 6 month order placed.

## 2024-01-20 NOTE — Telephone Encounter (Signed)
 Call report:  IMPRESSION: 1. Part solid nodule in the apical segment right upper lobe is stable from 03/14/2023. Lung-RADS 3, probably benign findings. Short-term follow-up in 6 months is recommended with repeat low-dose chest CT without contrast (please use the following order, "CT CHEST LCS NODULE FOLLOW-UP W/O CM"). These results will be called to the ordering clinician or representative by the Radiologist Assistant, and communication documented in the PACS or [employer]. 2. Age advanced 2 vessel coronary calcification. 3.  Aortic atherosclerosis (ICD10-I70.0). 4.  Emphysema (ICD10-J43.9).

## 2024-03-03 ENCOUNTER — Encounter: Admitting: Internal Medicine

## 2024-03-17 ENCOUNTER — Ambulatory Visit (INDEPENDENT_AMBULATORY_CARE_PROVIDER_SITE_OTHER): Admitting: Internal Medicine

## 2024-03-17 ENCOUNTER — Encounter: Payer: Self-pay | Admitting: Internal Medicine

## 2024-03-17 VITALS — BP 114/78 | HR 58 | Temp 98.6°F | Ht 67.0 in | Wt 161.0 lb

## 2024-03-17 DIAGNOSIS — Z125 Encounter for screening for malignant neoplasm of prostate: Secondary | ICD-10-CM | POA: Diagnosis not present

## 2024-03-17 DIAGNOSIS — I2510 Atherosclerotic heart disease of native coronary artery without angina pectoris: Secondary | ICD-10-CM

## 2024-03-17 DIAGNOSIS — E559 Vitamin D deficiency, unspecified: Secondary | ICD-10-CM | POA: Diagnosis not present

## 2024-03-17 DIAGNOSIS — E041 Nontoxic single thyroid nodule: Secondary | ICD-10-CM

## 2024-03-17 DIAGNOSIS — Z0000 Encounter for general adult medical examination without abnormal findings: Secondary | ICD-10-CM | POA: Diagnosis not present

## 2024-03-17 LAB — COMPREHENSIVE METABOLIC PANEL WITH GFR
ALT: 14 U/L (ref 0–53)
AST: 19 U/L (ref 0–37)
Albumin: 4.3 g/dL (ref 3.5–5.2)
Alkaline Phosphatase: 96 U/L (ref 39–117)
BUN: 16 mg/dL (ref 6–23)
CO2: 27 meq/L (ref 19–32)
Calcium: 9.1 mg/dL (ref 8.4–10.5)
Chloride: 105 meq/L (ref 96–112)
Creatinine, Ser: 0.94 mg/dL (ref 0.40–1.50)
GFR: 85 mL/min (ref >60.00)
Glucose, Bld: 101 mg/dL — ABNORMAL HIGH (ref 70–99)
Potassium: 4.7 meq/L (ref 3.5–5.1)
Sodium: 139 meq/L (ref 135–145)
Total Bilirubin: 1.2 mg/dL (ref 0.2–1.2)
Total Protein: 6.6 g/dL (ref 6.0–8.3)

## 2024-03-17 LAB — CBC WITH DIFFERENTIAL/PLATELET
Basophils Absolute: 0 10*3/uL (ref 0.0–0.1)
Basophils Relative: 0.4 % (ref 0.0–3.0)
Eosinophils Absolute: 0.2 10*3/uL (ref 0.0–0.7)
Eosinophils Relative: 2.6 % (ref 0.0–5.0)
HCT: 42 % (ref 39.0–52.0)
Hemoglobin: 14.1 g/dL (ref 13.0–17.0)
Lymphocytes Relative: 14.9 % (ref 12.0–46.0)
Lymphs Abs: 1 10*3/uL (ref 0.7–4.0)
MCHC: 33.6 g/dL (ref 30.0–36.0)
MCV: 87.3 fl (ref 78.0–100.0)
Monocytes Absolute: 0.4 10*3/uL (ref 0.1–1.0)
Monocytes Relative: 6.1 % (ref 3.0–12.0)
Neutro Abs: 5.3 10*3/uL (ref 1.4–7.7)
Neutrophils Relative %: 76 % (ref 43.0–77.0)
Platelets: 261 10*3/uL (ref 150.0–400.0)
RBC: 4.8 Mil/uL (ref 4.22–5.81)
RDW: 13.7 % (ref 11.5–15.5)
WBC: 6.9 10*3/uL (ref 4.0–10.5)

## 2024-03-17 LAB — TSH: TSH: 0.7 u[IU]/mL (ref 0.35–5.50)

## 2024-03-17 LAB — LIPID PANEL
Cholesterol: 112 mg/dL (ref 0–200)
HDL: 42.2 mg/dL (ref >39.00)
LDL Cholesterol: 58 mg/dL (ref 0–99)
NonHDL: 70.08
Total CHOL/HDL Ratio: 3
Triglycerides: 62 mg/dL (ref 0.0–149.0)
VLDL: 12.4 mg/dL (ref 0.0–40.0)

## 2024-03-17 LAB — URINALYSIS
Bilirubin Urine: NEGATIVE
Ketones, ur: NEGATIVE
Leukocytes,Ua: NEGATIVE
Nitrite: NEGATIVE
Specific Gravity, Urine: 1.025 (ref 1.000–1.030)
Total Protein, Urine: NEGATIVE
Urine Glucose: NEGATIVE
Urobilinogen, UA: 0.2 (ref 0.0–1.0)
pH: 6 (ref 5.0–8.0)

## 2024-03-17 LAB — PSA: PSA: 1.48 ng/mL (ref 0.10–4.00)

## 2024-03-17 NOTE — Assessment & Plan Note (Signed)
 On Vit D

## 2024-03-17 NOTE — Assessment & Plan Note (Signed)
Lipitor, Coreg 

## 2024-03-17 NOTE — Progress Notes (Signed)
 Subjective:  Patient ID: Blake [name], male    DOB: 1955-10-31  Age: 68 y.o. MRN: [id number]  CC: Annual Exam   HPI Shing-Ru Gadson presents for CAD, brain tumor, pre-DM Well exam  Outpatient Medications Prior to Visit  Medication Sig Dispense Refill   atorvastatin  (LIPITOR ) 40 MG tablet TAKE 1 TABLET BY MOUTH EVERY DAY 90 tablet 1   carvedilol  (COREG ) 3.125 MG tablet TAKE 1 TABLET BY MOUTH TWICE A DAY WITH FOOD 180 tablet 1   Cholecalciferol (VITAMIN D3) 50 MCG (2000 UT) capsule Take 1 capsule (2,000 Units total) by mouth daily. 100 capsule 3   dapagliflozin  propanediol (FARXIGA ) 10 MG TABS tablet Take 1 tablet (10 mg total) by mouth daily before breakfast. 90 tablet 3   nitroGLYCERIN  (NITROSTAT ) 0.4 MG SL tablet Place 1 tablet (0.4 mg total) under the tongue every 5 (five) minutes as needed. 25 tablet 3   sacubitril -valsartan  (ENTRESTO ) 49-51 MG TAKE 1 TABLET BY MOUTH TWICE A DAY 180 tablet 3   ticagrelor  (BRILINTA ) 60 MG TABS tablet TAKE 1 TABLET BY MOUTH TWICE A DAY 180 tablet 3   No facility-administered medications prior to visit.    ROS: Review of Systems  Constitutional:  Negative for appetite change, fatigue and unexpected weight change.  HENT:  Negative for congestion, nosebleeds, sneezing, sore throat and trouble swallowing.   Eyes:  Negative for itching and visual disturbance.  Respiratory:  Negative for cough.   Cardiovascular:  Negative for chest pain, palpitations and leg swelling.  Gastrointestinal:  Negative for abdominal distention, blood in stool, diarrhea and nausea.  Genitourinary:  Negative for frequency and hematuria.  Musculoskeletal:  Positive for gait problem. Negative for back pain, joint swelling and neck pain.  Skin:  Negative for rash.  Neurological:  Negative for dizziness, tremors, speech difficulty and weakness.  Psychiatric/Behavioral:  Negative for agitation, dysphoric mood and sleep disturbance. The patient is not nervous/anxious.      Objective:  BP 114/78   Pulse (!) 58   Temp 98.6 F (37 C) (Oral)   Ht 5' 7 (1.702 m)   Wt 161 lb (73 kg)   SpO2 97%   BMI 25.22 kg/m   BP Readings from Last 3 Encounters:  03/17/24 114/78  07/31/23 116/62  07/26/23 110/68    Wt Readings from Last 3 Encounters:  03/17/24 161 lb (73 kg)  07/31/23 157 lb (71.2 kg)  07/26/23 158 lb 12.8 oz (72 kg)    Physical Exam Constitutional:      General: He is not in acute distress.    Appearance: He is well-developed.     Comments: NAD   Eyes:     Conjunctiva/sclera: Conjunctivae normal.     Pupils: Pupils are equal, round, and reactive to light.   Neck:     Thyroid : No thyromegaly.     Vascular: No JVD.   Cardiovascular:     Rate and Rhythm: Normal rate and regular rhythm.     Heart sounds: Normal heart sounds. No murmur heard.    No friction rub. No gallop.  Pulmonary:     Effort: Pulmonary effort is normal. No respiratory distress.     Breath sounds: Normal breath sounds. No wheezing or rales.  Chest:     Chest wall: No tenderness.  Abdominal:     General: Bowel sounds are normal. There is no distension.     Palpations: Abdomen is soft. There is no mass.     Tenderness: There is no abdominal tenderness.  There is no guarding or rebound.   Musculoskeletal:        General: No tenderness. Normal range of motion.     Cervical back: Normal range of motion.  Lymphadenopathy:     Cervical: No cervical adenopathy.   Skin:    General: Skin is warm and dry.     Findings: No rash.   Neurological:     Mental Status: He is alert and oriented to person, place, and time.     Cranial Nerves: No cranial nerve deficit.     Motor: No abnormal muscle tone.     Coordination: Coordination normal.     Gait: Gait normal.     Deep Tendon Reflexes: Reflexes are normal and symmetric.   Psychiatric:        Behavior: Behavior normal.        Thought Content: Thought content normal.        Judgment: Judgment normal.   Pt  declined rectal exam  Lab Results  Component Value Date   WBC 6.4 02/05/2023   HGB 14.0 02/05/2023   HCT 42.1 02/05/2023   PLT 252.0 02/05/2023   GLUCOSE 101 (H) 02/05/2023   CHOL 110 02/05/2023   TRIG 62.0 02/05/2023   HDL 39.30 02/05/2023   LDLDIRECT 161.0 05/14/2012   LDLCALC 58 02/05/2023   ALT 16 02/05/2023   AST 24 02/05/2023   NA 140 02/05/2023   K 5.3 No hemolysis seen (H) 02/05/2023   CL 107 02/05/2023   CREATININE 0.88 02/05/2023   BUN 14 02/05/2023   CO2 26 02/05/2023   TSH 0.60 02/05/2023   PSA 0.85 02/05/2023   INR 0.9 07/07/2008   HGBA1C 6.2 02/05/2023    No results found.  Assessment & Plan:   Problem List Items Addressed This Visit     Vitamin D  deficiency   On Vit D      Well adult exam - Primary    We discussed age appropriate health related issues, including available/recomended screening tests and vaccinations. Labs were ordered to be later reviewed . All questions were answered. We discussed one or more of the following - seat belt use, use of sunscreen/sun exposure exercise, safe sex, fall risk reduction, second hand smoke exposure, firearm use and storage, seat belt use, a need for adhering to healthy diet and exercise. Labs were ordered.  All questions were answered. Declined colonoscopy Opth q 24 mo Shingrix  advised Cologuard (colonoscopy declined) 2017, 2020, 2023 Chest CT  - low dose 2024      Relevant Orders   TSH   Urinalysis   CBC with Differential/Platelet   Lipid panel   Comprehensive metabolic panel with GFR   PSA   Thyroid  nodule   Repeat US       Relevant Orders   US  SOFT TISSUE HEAD & NECK (NON-THYROID )   Coronary artery disease involving native coronary artery of native heart without angina pectoris (Chronic)   Lipitor , Coreg          No orders of the defined types were placed in this encounter.     Follow-up: Return in about 1 year (around 03/17/2025) for Wellness Exam.  Anitra Barn, MD

## 2024-03-17 NOTE — Assessment & Plan Note (Signed)
  We discussed age appropriate health related issues, including available/recomended screening tests and vaccinations. Labs were ordered to be later reviewed . All questions were answered. We discussed one or more of the following - seat belt use, use of sunscreen/sun exposure exercise, safe sex, fall risk reduction, second hand smoke exposure, firearm use and storage, seat belt use, a need for adhering to healthy diet and exercise. Labs were ordered.  All questions were answered. Declined colonoscopy Opth q 24 mo Shingrix  advised Cologuard (colonoscopy declined) 2017, 2020, 2023 Chest CT  - low dose 2024

## 2024-03-17 NOTE — Assessment & Plan Note (Signed)
Repeat US

## 2024-03-18 ENCOUNTER — Ambulatory Visit: Payer: Self-pay | Admitting: Internal Medicine

## 2024-03-30 ENCOUNTER — Ambulatory Visit
Admission: RE | Admit: 2024-03-30 | Discharge: 2024-03-30 | Disposition: A | Discharge status: Home / Self Care | Source: Ambulatory Visit | Attending: Internal Medicine | Admitting: Internal Medicine

## 2024-04-05 ENCOUNTER — Other Ambulatory Visit: Payer: Self-pay | Admitting: Internal Medicine

## 2024-04-05 DIAGNOSIS — E041 Nontoxic single thyroid nodule: Secondary | ICD-10-CM

## 2024-05-03 ENCOUNTER — Other Ambulatory Visit: Payer: Self-pay | Admitting: Cardiology

## 2024-05-04 ENCOUNTER — Ambulatory Visit
Admission: RE | Admit: 2024-05-04 | Discharge: 2024-05-04 | Disposition: A | Discharge status: Home / Self Care | Source: Ambulatory Visit | Attending: Internal Medicine | Admitting: Internal Medicine

## 2024-05-04 ENCOUNTER — Other Ambulatory Visit (HOSPITAL_COMMUNITY)
Admission: RE | Admit: 2024-05-04 | Discharge: 2024-05-04 | Disposition: A | Discharge status: Home / Self Care | Source: Ambulatory Visit | Attending: Radiology | Admitting: Radiology

## 2024-05-04 DIAGNOSIS — E041 Nontoxic single thyroid nodule: Secondary | ICD-10-CM | POA: Diagnosis present

## 2024-05-06 ENCOUNTER — Ambulatory Visit: Payer: Self-pay | Admitting: Internal Medicine

## 2024-05-06 DIAGNOSIS — E041 Nontoxic single thyroid nodule: Secondary | ICD-10-CM

## 2024-05-06 LAB — CYTOLOGY - NON PAP

## 2024-05-07 ENCOUNTER — Telehealth: Payer: Self-pay

## 2024-05-07 NOTE — Telephone Encounter (Signed)
 CT Chest LCS Nodule F/U wo contrast 71250 is currently ordered.

## 2024-05-07 NOTE — Telephone Encounter (Signed)
 Copied from CRM [id number]. Topic: Clinical - Request for Lab/Test Order >> May 07, 2024  8:30 AM Joesph PARAS wrote: Reason for CRM: Patient is requesting 6 mo/ f/u lung cancer scan prior to visiting with provider. Patient states code [id number] or [phone] will ensure the scans are covered entirely. Patient would lie order to be coded correctly so that his insurance will pay for.

## 2024-05-18 ENCOUNTER — Encounter (HOSPITAL_COMMUNITY): Payer: Self-pay

## 2024-05-20 ENCOUNTER — Ambulatory Visit: Payer: Self-pay | Admitting: Internal Medicine

## 2024-06-02 ENCOUNTER — Telehealth: Payer: Self-pay

## 2024-06-02 NOTE — Telephone Encounter (Signed)
 Routing to the LCS nurses to advise [employer].

## 2024-06-22 ENCOUNTER — Ambulatory Visit
Admission: RE | Admit: 2024-06-22 | Discharge: 2024-06-22 | Disposition: A | Discharge status: Home / Self Care | Source: Ambulatory Visit | Attending: Acute Care | Admitting: Acute Care

## 2024-06-22 DIAGNOSIS — R918 Other nonspecific abnormal finding of lung field: Secondary | ICD-10-CM

## 2024-06-22 DIAGNOSIS — Z87891 Personal history of nicotine dependence: Secondary | ICD-10-CM

## 2024-06-22 DIAGNOSIS — Z122 Encounter for screening for malignant neoplasm of respiratory organs: Secondary | ICD-10-CM

## 2024-06-29 ENCOUNTER — Other Ambulatory Visit: Payer: Self-pay | Admitting: Acute Care

## 2024-06-29 ENCOUNTER — Telehealth: Payer: Self-pay

## 2024-06-29 NOTE — Progress Notes (Signed)
 I have called the patient with the results of his low dose CT Chest. His scan was read as a LR 4 A. He has a 1.6 x 1.5 cm with a solid component measuring 8 mm, previously measured same size with solid component of 7 mm. Additional ground-glass attenuations in left upper lobe up to 9 mm may represent foci of infection/inflammation versus adenocarcinoma spectrum   Please schedule a 3 month follow up. Pt. Will be [place] from [facility] November to [facility] January.Please call to schedule his scan for mid January 2026, and call to get scheduled at the beginning of November, before he leaves. Thanks so much. Please fax results to PCP and let them know the plan of care.   If follow up scan shows continued increase in solid component of nodule, we will do a PET scan, as we have been following this for more than a year.

## 2024-06-29 NOTE — Telephone Encounter (Signed)
 Call Report from Tiffany  IMPRESSION: Right upper lobe subsolid nodule with interval increase in size of the solid component concerning for malignancy until proven otherwise. Lung-RADS 4A, suspicious. Follow up low-dose chest CT without contrast in 3 months (please use the following order, CT CHEST LCS NODULE FOLLOW-UP W/O CM) is recommended. Alternatively, PET may be considered when there is a solid component 8mm or larger.   Remainder of the ground-glass, subsolid and solid nodules are grossly stable to prior. Follow-up to ensure stability.   Centrilobular emphysematous changes. Some cystic changes in bilateral lung periphery likely sequela from chronic infection/inflammation, stable to prior.

## 2024-06-30 ENCOUNTER — Other Ambulatory Visit: Payer: Self-pay

## 2024-06-30 DIAGNOSIS — R911 Solitary pulmonary nodule: Secondary | ICD-10-CM

## 2024-06-30 DIAGNOSIS — Z122 Encounter for screening for malignant neoplasm of respiratory organs: Secondary | ICD-10-CM

## 2024-06-30 DIAGNOSIS — Z87891 Personal history of nicotine dependence: Secondary | ICD-10-CM

## 2024-06-30 NOTE — Telephone Encounter (Signed)
 Provider called and spoke with the patient. Pt will complete a 3 month follow up scan when he returns to the country mid January 2026.

## 2024-07-30 ENCOUNTER — Other Ambulatory Visit: Payer: Self-pay | Admitting: Cardiology

## 2024-07-30 NOTE — Progress Notes (Signed)
 Cardiology Office Note:  .   Date:  08/02/2024  ID:  Blake [name], DOB [birth year], MRN [id number] PCP: Garald Karlynn GAILS, MD  [facility] Providers Cardiologist:  Alm Clay, MD Electrophysiologist:  Will Gladis Norton, MD     Chief Complaint  Patient presents with   Follow-up    Annual follow-up.  Doing well.  No issues.   Coronary Artery Disease    No angina   Cardiomyopathy    Ischemic cardiomyopathy after large infarct.  No CHF symptoms    Patient Profile: .     Blake [name] is a healthy-appearing 68 y.o. [place] male  with a PMH notable for CAD/Anterior MI with LAD PCI and ICM who presents here for annual follow-up at the request of Plotnikov, Karlynn GAILS, MD.  CV History: 01/2017: Large delayed presentation anterior MI and PCI to LAD Ischemic CM-EF 25 to 30%, declined ICD; NYHA class I CHF symptoms. Borderline DM-2, HLD    Blake Ahonen was last seen on July 31, 2023.  He was still maintaining a regular exercise regimen of jogging and walking approximate 4 to 5 miles a day.  No active angina or heart failure symptoms.  No heart failure symptoms on s stable medications.  No medication changes made.  Subjective  Discussed the use of AI scribe software for clinical note transcription with the patient, who gave verbal consent to proceed.  History of Present Illness Blake [name] is a 68 year old male with coronary artery disease and prior myocardial infarction who presents for a routine follow-up visit.  He has no new symptoms over the past year and has questions regarding his medications, specifically the transition of Entresto  and Brilinta  to his generic forms. He is concerned about the name change and wants to ensure the medications are the same. He reports taking Entresto  (valsartan /sacubitril ), carvedilol , atorvastatin , and Brilinta  (ticagrelor ). He usually gets Entresto  from [facility] and noticed the name change to the generic form, which confused him. He  confirms that he has been using a coupon for Entresto , which made it cheaper than the generic version.  He exercises regularly, walking about five miles daily, and reports no issues with heart racing, chest pain, dizziness, shortness of breath, or swelling in his legs. No episodes of passing out, stroke-like symptoms, or bleeding issues.  He has a history of meningioma for which he underwent brain surgery. He also has a lung nodule that is being monitored and a thyroid  nodule that was biopsied in August and found to be benign.  He plans to travel to [place] at the end of the year for a couple of months to celebrate the [place] New Year. He speaks both Mandarin and Taiwanese, noting that Taiwanese is a distinct dialect from Mandarin.     Objective   Medications: Ticagrelor  60 mg twice daily Atorvastatin  40 mg daily; Farxiga  10 mg daily Carvedilol  3.125 mg twice daily; sacubitril -valsartan  (Entresto ) 49-51 mg twice daily As needed NTG 0.4 mg Vitamin D3 supplements  Studies Reviewed: SABRA   EKG Interpretation Date/Time:  Friday July 31 2024 08:19:03 EDT Ventricular Rate:  68 PR Interval:  168 QRS Duration:  136 QT Interval:  382 QTC Calculation: 406 R Axis:   47  Text Interpretation: Normal sinus rhythm Right bundle branch block Anteroseptal infarct (cited on or before 24-Feb-2017) When compared with ECG of 31-Jul-2023 08:23, No significant change was found Confirmed by Clay Alm ([id number]) on 07/31/2024 8:39:07 AM    Results LABS from [facility] (03/17/2024): TC 112,  TG 60, HDL 40, LDL 58. Cr 0.94, K+ 4.7, Hgb 14.1, TSH 0.7.   PATHOLOGY Thyroid  Nodule Biopsy: Benign (05/2024) Pertinent Cardiac Studies ECHO: Severely reduced LV EF 25 to 30% with thinning and akinesis of anterior septum and inferoseptum as well as anterior wall; mild MR.  Mildly dilated LA.  (March 2019) Cardiac MRI: Moderate LVE with evidence of large previous anterior MI with EF of 32%.  Full-thickness scar involving  the mid and distal anterior wall, septal as well as septal inferior apex.  Mild LAE.  Normal RV.  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  Small nondominant RCA.  Moderate 30% and LCx.  (02/23/2017)  Risk Assessment/Calculations:         Physical Exam:   VS:  BP 100/60 (BP Location: Left Arm, Patient Position: Sitting, Cuff Size: Normal)   Pulse 68   Ht 5' 7 (1.702 m)   Wt 158 lb (71.7 kg)   SpO2 98%   BMI 24.75 kg/m    Wt Readings from Last 3 Encounters:  07/31/24 158 lb (71.7 kg)  03/17/24 161 lb (73 kg)  07/31/23 157 lb (71.2 kg)      GEN: Well nourished, well groomed; in no acute distress; healthy. NECK: No JVD; No carotid bruits CARDIAC: Normal S1, S2; RRR, no murmurs, rubs, gallops RESPIRATORY:  Clear to auscultation without rales, wheezing or rhonchi ; nonlabored, good air movement. ABDOMEN: Soft, non-tender, non-distended EXTREMITIES:  No edema; No deformity      ASSESSMENT AND PLAN: .    Problem List Items Addressed This Visit       Cardiology Problems   Chronic combined systolic and diastolic heart failure, NYHA class 1 (HCC) - Primary (Chronic)   Chronic HFrEF.  Stable NYHA class I symptoms.  Minimal exertional dyspnea.  No limitations to activity. Managed with Entresto , carvedilol , and Farxiga . No symptoms of exacerbation. Discussed Entresto 's benefits over valsartan  alone. Agreed to continue Entresto  unless financial issues arise. - Continue Entresto  (valsartan /sacubitril ) 49/51 mg BID. - Continue carvedilol  3.125 mg BID. - Continue Farxiga  10 mg daily  Euvolemic on exam.  No diuretic requirement.      Relevant Orders   EKG 12-Lead (Completed)   Coronary artery disease involving native coronary artery of native heart without angina pectoris (Chronic)   LAD PCI in the setting anterior STEMI,  unfortunately was delayed presentation and there is minimal recovery of function. No other significant CAD. Very active and functional exercises routinely-greater than 10 METS. Has been on maintenance dose Brilinta  60 mg twice daily for stent prophylaxis and secondary prevention. Is on stable dose of atorvastatin  40 mg daily along with combination of carvedilol  and Entresto  to be used for his ischemic cardiomyopathy.  Discussed cost-related switch to Plavix, but will continue Brilinta  if covered by insurance. No angina or arrhythmias. - Continue maintenance dose Brilinta  60 mg twice daily for maintenance. - Consider switching to Plavix 75 mg daily if Brilinta  becomes financially burdensome.      Relevant Orders   EKG 12-Lead (Completed)   Hyperlipidemia with target low density lipoprotein (LDL) cholesterol less than 55 mg/dL (Chronic)   Managed with atorvastatin . Lipid levels well-controlled. No myalgia symptoms. - Continue atorvastatin  40 mg daily.      Relevant Orders   EKG 12-Lead (Completed)   Hypotension due to drugs  Relevant Orders   EKG 12-Lead (Completed)   Ischemic cardiomyopathy (Chronic)   Significant reduced EF of 25 to 30% post anterior infarct.  Confirmed on MRI with large anterior infarct. He deferred ICD. NYHA class I CHF symptoms on stable regimen.      Relevant Orders   EKG 12-Lead (Completed)   ST elevation myocardial infarction (STEMI), subsequent episode of care [facility]) (Chronic)   Just over 8-1/2 years from his large anterior MI doing well with no current symptoms.  Unstable regiment.  Continue annual monitoring      Relevant Orders   EKG 12-Lead (Completed)     Other   Presence of drug coated stent in LAD coronary artery (Chronic)   Currently maintained on Brilinta  for SAP T maintenance at 60 mg twice daily.  Due to changes in formulation to generic dosing, there may be financial issues and immediately switched to Plavix 75 mg daily.  Either way, okay  to hold Thienopyridine 5 to 7 days preop or surgeries or procedures.  No need for aspirin  bridging.      Relevant Orders   EKG 12-Lead (Completed)           Follow-Up: Return in about 1 year (around 07/31/2025) for Routine follow up with me, [employer].     Signed, Alm MICAEL Clay, MD, MS Alm Clay, M.D., M.S. Interventional Cardiologist  [facility] Pager # [phone]

## 2024-07-31 ENCOUNTER — Ambulatory Visit: Attending: Cardiology | Admitting: Cardiology

## 2024-07-31 VITALS — BP 100/60 | HR 68 | Ht 67.0 in | Wt 158.0 lb

## 2024-07-31 DIAGNOSIS — I952 Hypotension due to drugs: Secondary | ICD-10-CM

## 2024-07-31 DIAGNOSIS — I213 ST elevation (STEMI) myocardial infarction of unspecified site: Secondary | ICD-10-CM

## 2024-07-31 DIAGNOSIS — Z955 Presence of coronary angioplasty implant and graft: Secondary | ICD-10-CM | POA: Diagnosis not present

## 2024-07-31 DIAGNOSIS — I255 Ischemic cardiomyopathy: Secondary | ICD-10-CM

## 2024-07-31 DIAGNOSIS — I5042 Chronic combined systolic (congestive) and diastolic (congestive) heart failure: Secondary | ICD-10-CM

## 2024-07-31 DIAGNOSIS — E785 Hyperlipidemia, unspecified: Secondary | ICD-10-CM

## 2024-07-31 DIAGNOSIS — I2510 Atherosclerotic heart disease of native coronary artery without angina pectoris: Secondary | ICD-10-CM | POA: Diagnosis not present

## 2024-07-31 NOTE — Patient Instructions (Addendum)

## 2024-08-01 ENCOUNTER — Other Ambulatory Visit: Payer: Self-pay | Admitting: Cardiology

## 2024-08-02 ENCOUNTER — Encounter: Payer: Self-pay | Admitting: Cardiology

## 2024-08-02 NOTE — Assessment & Plan Note (Signed)
 Significant reduced EF of 25 to 30% post anterior infarct.  Confirmed on MRI with large anterior infarct. He deferred ICD. NYHA class I CHF symptoms on stable regimen.

## 2024-08-02 NOTE — Assessment & Plan Note (Addendum)
 LAD PCI in the setting anterior STEMI, unfortunately was delayed presentation and there is minimal recovery of function. No other significant CAD. Very active and functional exercises routinely-greater than 10 METS. Has been on maintenance dose Brilinta  60 mg twice daily for stent prophylaxis and secondary prevention. Is on stable dose of atorvastatin  40 mg daily along with combination of carvedilol  and Entresto  to be used for his ischemic cardiomyopathy.  Discussed cost-related switch to Plavix, but will continue Brilinta  if covered by insurance. No angina or arrhythmias. - Continue maintenance dose Brilinta  60 mg twice daily for maintenance. - Consider switching to Plavix 75 mg daily if Brilinta  becomes financially burdensome.

## 2024-08-02 NOTE — Assessment & Plan Note (Signed)
 Currently maintained on Brilinta  for SAP T maintenance at 60 mg twice daily.  Due to changes in formulation to generic dosing, there may be financial issues and immediately switched to Plavix 75 mg daily.  Either way, okay to hold Thienopyridine 5 to 7 days preop or surgeries or procedures.  No need for aspirin  bridging.

## 2024-08-02 NOTE — Assessment & Plan Note (Signed)
 Just over 8-1/2 years from his large anterior MI doing well with no current symptoms.  Unstable regiment.  Continue annual monitoring

## 2024-08-02 NOTE — Assessment & Plan Note (Signed)
 Managed with atorvastatin . Lipid levels well-controlled. No myalgia symptoms. - Continue atorvastatin  40 mg daily.

## 2024-08-02 NOTE — Assessment & Plan Note (Signed)
 Chronic HFrEF.  Stable NYHA class I symptoms.  Minimal exertional dyspnea.  No limitations to activity. Managed with Entresto , carvedilol , and Farxiga . No symptoms of exacerbation. Discussed Entresto 's benefits over valsartan  alone. Agreed to continue Entresto  unless financial issues arise. - Continue Entresto  (valsartan /sacubitril ) 49/51 mg BID. - Continue carvedilol  3.125 mg BID. - Continue Farxiga  10 mg daily  Euvolemic on exam.  No diuretic requirement.

## 2024-08-20 ENCOUNTER — Encounter: Payer: Self-pay | Admitting: "Endocrinology

## 2024-08-20 ENCOUNTER — Ambulatory Visit: Admitting: "Endocrinology

## 2024-08-20 VITALS — BP 120/70 | HR 64 | Ht 67.0 in | Wt 160.0 lb

## 2024-08-20 DIAGNOSIS — E042 Nontoxic multinodular goiter: Secondary | ICD-10-CM

## 2024-08-20 DIAGNOSIS — Z87891 Personal history of nicotine dependence: Secondary | ICD-10-CM | POA: Diagnosis not present

## 2024-08-20 NOTE — Progress Notes (Signed)
 Outpatient Endocrinology Note Obadiah Birmingham, MD  08/20/24   Blake [name] June 24, 1956 [id number]  Referring Provider: Garald Karlynn GAILS, MD Primary Care Provider: Garald Karlynn GAILS, MD Subjective  No chief complaint on file.   Assessment & Plan  Diagnoses and all orders for this visit:  Multinodular goiter  Former smoker, stopped smoking in distant past   Blake [name] is currently taking no thyroid  medication. Patient is currently biochemically euthyroid.  Educated on thyroid  axis.  Recommend the following: thyroid  lab annually  Repeat lab before next visit or sooner if symptoms of hyperthyroidism or hypothyroidism develop.  Notify us  immediately in case of significant weight gain or loss. Counseled on compliance and follow up needs.  03/30/24 THYROID  ULTRASOUND images and report reviewed: IMPRESSION: Borderline-enlarged, heterogeneous and multinodular thyroid . 1.0 cm LEFT superior TR-5 thyroid  nodule. FNA biopsy of this highly suspicious nodule should be considered based on TI-RADS criteria. 1.0 cm RIGHT mid (#2) and 1.1 cm RIGHT mid (#3) TR-4 thyroid  nodules. A follow-up ultrasound in 1 year should be considered based on TI-RADS criteria. Additional sub-1 cm solid, mixed cystic and solid, and cystic nodules scattered within the gland do not meet threshold for follow-up nor biopsy per current criteria. 05/04/24 FNA Nodule #5: Left; Superior, 1.0 cmx 1.0 x 0.7 cm revealed thyroid  follicular lesion of undetermined significance (Bethesda category III) s/p, benign Afirma results Recommend follow-up ultrasound next year Patient would like to maintain follow-up with PCP   I have reviewed current medications, nurse's notes, allergies, vital signs, past medical and surgical history, family medical history, and social history for this encounter. Counseled patient on symptoms, examination findings, lab findings, imaging results, treatment decisions and monitoring and prognosis.  The patient understood the recommendations and agrees with the treatment plan. All questions regarding treatment plan were fully answered.   No follow-ups on file. Patient would like to maintain follow-up with PCP  Obadiah Birmingham, MD  08/20/24   I have reviewed current medications, nurse's notes, allergies, vital signs, past medical and surgical history, family medical history, and social history for this encounter. Counseled patient on symptoms, examination findings, lab findings, imaging results, treatment decisions and monitoring and prognosis. The patient understood the recommendations and agrees with the treatment plan. All questions regarding treatment plan were fully answered.   History of Present Illness Blake [name] is a 68 y.o. year old male who presents to our clinic with multinodular goiter diagnosed many years ago.  Has thyroid  nodule for many years. And done an FNA in past.  Never been on thyroid  medication  No history of thyroid  cancer in self/family   Feels well overall  Compressive symptoms:  dysphagia  No dysphonia  No positional dyspnea (especially with simultaneous arms elevation)  No  Smokes  No, in past  On biotin  No Personal history of head/neck surgery/irradiation  Yes, meningioma surgery long ago    Physical Exam  BP 120/70   Pulse 64   Ht 5' 7 (1.702 m)   Wt 160 lb (72.6 kg)   SpO2 98%   BMI 25.06 kg/m  Constitutional: well developed, well nourished Head: normocephalic, atraumatic, n exophthalmos Eyes: sclera anicteric, no redness Neck: mild thyromegaly, no thyroid  tenderness; + nodules palpated Lungs: normal respiratory effort Neurology: alert and oriented, no fine hand tremor Skin: dry, no appreciable rashes Musculoskeletal: no appreciable defects Psychiatric: normal mood and affect  Allergies No Known Allergies  Current Medications Patient's Medications  New Prescriptions   No medications on file  Previous  Medications    ATORVASTATIN  (LIPITOR ) 40 MG TABLET    TAKE 1 TABLET BY MOUTH EVERY DAY   CARVEDILOL  (COREG ) 3.125 MG TABLET    TAKE 1 TABLET BY MOUTH TWICE A DAY WITH FOOD   CHOLECALCIFEROL (VITAMIN D3) 50 MCG (2000 UT) CAPSULE    Take 1 capsule (2,000 Units total) by mouth daily.   DAPAGLIFLOZIN  PROPANEDIOL (FARXIGA ) 10 MG TABS TABLET    Take 1 tablet (10 mg total) by mouth daily before breakfast.   NITROGLYCERIN  (NITROSTAT ) 0.4 MG SL TABLET    Place 1 tablet (0.4 mg total) under the tongue every 5 (five) minutes as needed.   SACUBITRIL -VALSARTAN  (ENTRESTO ) 49-51 MG    TAKE 1 TABLET BY MOUTH TWICE A DAY   TICAGRELOR  (BRILINTA ) 60 MG TABS TABLET    TAKE 1 TABLET BY MOUTH TWICE A DAY  Modified Medications   No medications on file  Discontinued Medications   No medications on file    Past Medical History Past Medical History:  Diagnosis Date   CAD (coronary artery disease), native coronary artery 01/2017   a. 01/2017: anterior STEMI with 100% occlusion of the prox-mid LAD with 95% distal LAD stenosis due to distal embolization of thrombus. A Synergy DES was placed to the prox-LAD.   Cancer [facility]) 2009   brain tumor   Hyperlipemia    Ischemic cardiomyopathy    a. 01/2017: initial echo showed a reduced EF of 30-35% with repeat echo of 40-45%. -->  However follow-up echo showed EF of 30-35%, EF confirmed to be 30% by MRI October 2018.   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    TOBACCO USER 11/28/2009   Qualifier: Diagnosis of  By: Garald MD, Karlynn GAILS    VITAMIN D  DEFICIENCY 07/02/2008   Qualifier: Diagnosis of  By: Garald MD, Karlynn GAILS     Past Surgical History Past Surgical History:  Procedure Laterality Date   CORONARY STENT INTERVENTION N/A 02/23/2017   Procedure: Coronary Stent Intervention;  Surgeon: Anner Alm ORN, MD;  Location: [facility] INVASIVE CV LAB;; p-mLAD 100% (thrombotic) --> PCI- SYNERGY DES 3 X 24 -- 3.1 mm; distal embolization to apical LAD.   CORONARY/GRAFT ACUTE MI REVASCULARIZATION N/A 02/23/2017   Procedure: Coronary/Graft Acute MI Revascularization;  Surgeon: Anner Alm ORN, MD;  Location: [facility] INVASIVE CV LAB;  Service: Cardiovascular;;; PCI of 100% thrombotic p-mLAD in setting of Anterior STEMI - delayed presentation   CRANIECTOMY / CRANIOTOMY FOR EXCISION OF BRAIN TUMOR  2009   surgery and XRT at [facility]   LEFT HEART CATH AND CORONARY ANGIOGRAPHY N/A 02/23/2017   Procedure: Left Heart Cath and Coronary Angiography;  Surgeon: Anner Alm ORN, MD;  Location: [facility] INVASIVE CV LAB;;; Delayed presentation of Anterior STEMI -- p-m LAD 100% thrombotic occlusion, 30% AVG Cx. EF ~35% (anterior-anteroapical severe hypokinesis   TRANSTHORACIC ECHOCARDIOGRAM  11/2017   Anterior , septal, apical and inferior apical  hypokinesis. The cavity size was moderately dilated. Wall  thickness was normal. Systolic function was severely reduced. Mild AoV calcification    Family History family history includes Alzheimer's disease in his father; Arthritis in his mother; Goiter in his mother; Hypertension in an other family member; Parkinsonism in his father.  Social History Social History   Socioeconomic History   Marital status: Married    Spouse name: Not on file   Number of children: 1   Years of education: Not on file   Highest education level: Doctorate  Occupational History   Occupation: Garment/textile Technologist: [employer]  Tobacco Use   Smoking  status: Former    Current packs/day: 0.00    Types: Cigarettes    Quit date: 03/31/2017    Years since quitting: 7.3   Smokeless tobacco: Never  Vaping Use   Vaping status: Never Used  Substance and Sexual Activity   Alcohol use: No   Drug use: No   Sexual activity: Yes  Other Topics Concern   Not on file  Social History Narrative   Very active. 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 He also walks daily after work for at least 30-45 minutes. On weekends he tries to walk at least an hour. This is usually a fast walk or slow jog.      He quit smoking shortly after his MI.   Social Drivers of Corporate Investment Banker Strain: Low Risk  (03/15/2024)   Overall Financial Resource Strain (CARDIA)    Difficulty of Paying Living Expenses: Not hard at all  Food Insecurity: No Food Insecurity (03/15/2024)   Hunger Vital Sign    Worried About Running Out of Food in the Last Year: Never true    Ran Out of Food in the Last Year: Never true  Transportation Needs: No Transportation Needs (03/15/2024)   PRAPARE - Administrator, Civil Service (Medical): No    Lack of Transportation (Non-Medical): No  Physical Activity: Sufficiently Active (03/15/2024)   Exercise Vital Sign    Days of Exercise per Week: 7 days    Minutes of Exercise per Session: 80 min  Stress: No Stress Concern Present (03/15/2024)   [employer] of Occupational Health - Occupational Stress Questionnaire    Feeling of Stress: Not at all  Social Connections: Moderately Isolated (03/15/2024)   Social Connection and Isolation Panel    Frequency of Communication with Friends and Family: More than three times a week    Frequency of Social Gatherings with Friends and Family: More than three times a week    Attends Religious Services: Never    Database Administrator or Organizations: No    Attends Banker Meetings: Not on file    Marital Status: Married  Intimate Partner Violence: Not on file     Laboratory Investigations Lab Results  Component Value Date   TSH 0.70 03/17/2024   TSH 0.60 02/05/2023   TSH 0.63 02/05/2022   FREET4 0.88 02/02/2021   FREET4 0.93 05/20/2019     No results found for: TSI   No components found for: TRAB   Lab Results  Component Value Date   CHOL 112 03/17/2024   Lab Results  Component Value Date   HDL 42.20 03/17/2024   Lab Results  Component Value Date   LDLCALC 58 03/17/2024   Lab Results  Component Value Date   TRIG 62.0 03/17/2024   Lab Results  Component Value  Date   CHOLHDL 3 03/17/2024   Lab Results  Component Value Date   CREATININE 0.94 03/17/2024   Lab Results  Component Value Date   GFR 85.00 03/17/2024      Component Value Date/Time   NA 139 03/17/2024 0949   NA 143 09/11/2017 1149   K 4.7 03/17/2024 0949   CL 105 03/17/2024 0949   CO2 27 03/17/2024 0949   GLUCOSE 101 (H) 03/17/2024 0949   BUN 16 03/17/2024 0949   BUN 14 09/11/2017 1149   CREATININE 0.94 03/17/2024 0949   CALCIUM  9.1 03/17/2024 0949   PROT 6.6 03/17/2024 0949   PROT 6.4 06/26/2017 1054   ALBUMIN 4.3 03/17/2024 0949   ALBUMIN 4.2 06/26/2017 1054   AST 19 03/17/2024 0949   ALT 14 03/17/2024 0949   ALKPHOS 96 03/17/2024 0949   BILITOT 1.2 03/17/2024 0949   BILITOT 0.6 06/26/2017 1054   GFRNONAA 94 09/11/2017 1149   GFRAA 109 09/11/2017 1149      Latest Ref Rng & Units 03/17/2024    9:49 AM 02/05/2023    9:48 AM 08/07/2022    7:38 AM  BMP  Glucose 70 - 99 mg/dL 898  898  889   BUN 6 - 23 mg/dL 16  14  18    Creatinine 0.40 - 1.50 mg/dL 9.05  9.11  9.01   Sodium 135 - 145 mEq/L 139  140  138   Potassium 3.5 - 5.1 mEq/L 4.7  5.3 No hemolysis seen  4.6   Chloride 96 - 112 mEq/L 105  107  106   CO2 19 - 32 mEq/L 27  26  25    Calcium  8.4 - 10.5 mg/dL 9.1  9.1  9.0        Component Value Date/Time   WBC 6.9 03/17/2024 0949   RBC 4.80 03/17/2024 0949   HGB 14.1 03/17/2024 0949   HCT 42.0 03/17/2024 0949   PLT 261.0 03/17/2024  0949   MCV 87.3 03/17/2024 0949   MCH 28.6 02/26/2017 0435   MCHC 33.6 03/17/2024 0949   RDW 13.7 03/17/2024 0949   LYMPHSABS 1.0 03/17/2024 0949   MONOABS 0.4 03/17/2024 0949   EOSABS 0.2 03/17/2024 0949   BASOSABS 0.0 03/17/2024 0949      Parts of this note may have been dictated using voice recognition software. There may be variances in spelling and vocabulary which are unintentional. Not all errors are proofread. Please notify the dino if any discrepancies are noted or if the meaning of any statement is not clear.

## 2024-08-22 ENCOUNTER — Other Ambulatory Visit: Payer: Self-pay | Admitting: Cardiology

## 2024-08-24 ENCOUNTER — Encounter: Payer: Self-pay | Admitting: Cardiology

## 2024-08-24 MED ORDER — TICAGRELOR 60 MG PO TABS: 60.0000 mg | ORAL_TABLET | Freq: Two times a day (BID) | ORAL | 3 refills | Status: AC

## 2024-09-15 ENCOUNTER — Other Ambulatory Visit: Payer: Self-pay | Admitting: Cardiology

## 2024-09-15 ENCOUNTER — Encounter: Payer: Self-pay | Admitting: Cardiology

## 2024-09-17 ENCOUNTER — Inpatient Hospital Stay: Admission: RE | Admit: 2024-09-17

## 2024-09-17 DIAGNOSIS — Z122 Encounter for screening for malignant neoplasm of respiratory organs: Secondary | ICD-10-CM

## 2024-09-17 DIAGNOSIS — Z87891 Personal history of nicotine dependence: Secondary | ICD-10-CM

## 2024-09-17 DIAGNOSIS — R911 Solitary pulmonary nodule: Secondary | ICD-10-CM

## 2024-09-28 ENCOUNTER — Telehealth: Payer: Self-pay

## 2024-09-28 NOTE — Telephone Encounter (Signed)
 Call Report  IMPRESSION: 1. Enlarging mixed attenuation nodule in the apical segment right upper lobe. Lung-RADS 4B, suspicious. Additional imaging evaluation or consultation with Pulmonology or Thoracic Surgery recommended. These results will be called to the ordering clinician or representative by the Radiologist Assistant, and communication documented in the PACS or [employer]. 2. Additional scattered areas of cystic change and associated ground-glass, primarily in the lower lobes. Recommend attention on follow-up. 3. Aortic atherosclerosis (ICD10-I70.0). Coronary artery calcification. 4.  Emphysema (ICD10-J43.9).

## 2024-10-02 ENCOUNTER — Telehealth: Payer: Self-pay | Admitting: Acute Care

## 2024-10-02 NOTE — Telephone Encounter (Signed)
 I have attempted to call the patient with the results of their  Low Dose CT Chest Lung cancer screening scan. There was no answer. I attempted to call the cell and home numbers, both with no answer. I have left a HIPPA compliant VM requesting the patient call the office for the scan results. I included the office contact information in the message. We will await his return call. If no return call we will continue to call until patient is contacted.    This is a 4 B with a growing nodule. Patient needs a PET scan and follow up with me in the office 1 week after the scan. Please let PCP know, and if you need me to speak with him when he calls back, I am happy to do so. If he has not called back by Monday, I will try again.

## 2024-10-05 ENCOUNTER — Telehealth: Payer: Self-pay | Admitting: Acute Care

## 2024-10-05 NOTE — Telephone Encounter (Signed)
 Results and plan to PCP. Reminder se tto confirm appt kept and PET ordered.

## 2024-10-05 NOTE — Telephone Encounter (Signed)
 Blake [name]  Patient sent a mychart message advising he has reviewed his recent Lung CT results. He is already out of the [place] until March. See message below, this was received prior to your outreach.   Dear Lauraine,   I have reviewed my CT lung nodule screening report.    I will be out of the country starting this Sunday and will return in early March. Unfortunately, I will not have access to MyChart while I am overseas, as access will be blocked.   I have made an appointment with you on March 10 via MyChart, so that we can discuss the next steps. Since I will not have access to MyChart during this time, please feel free to reach me via email at [email or web address] if needed.   Thank you very much for your help, and I wish you a happy New Year.   Best regards, Shing-Ru Cesario Rake, RN

## 2024-10-05 NOTE — Telephone Encounter (Signed)
 Pt will need PET scan once he gets back from [place]. He has appointment with me 12/08/2024. He is aware of the scan results. Please fax results to PCP and let them know patient is out of the country, and PET scan will be ordered at 12/08/2024 appointment. Thanks so much

## 2024-10-05 NOTE — Telephone Encounter (Signed)
 See provider note 10/02/2024 for update

## 2024-10-05 NOTE — Telephone Encounter (Signed)
 See provider note 10/05/2024 for plan.

## 2024-12-08 ENCOUNTER — Ambulatory Visit: Admitting: Acute Care

## 2025-03-18 ENCOUNTER — Encounter: Admitting: Internal Medicine

## 2025-09-16 ENCOUNTER — Other Ambulatory Visit
